# Patient Record
Sex: Female | Born: 1990 | Race: Black or African American | Hispanic: No | Marital: Married | Smoking: Former smoker
Health system: Southern US, Community
[De-identification: ages and names within clinical notes are randomized; demographics above are authoritative.]

## PROBLEM LIST (undated history)

## (undated) ENCOUNTER — Inpatient Hospital Stay (HOSPITAL_COMMUNITY): Payer: Self-pay

## (undated) DIAGNOSIS — D649 Anemia, unspecified: Secondary | ICD-10-CM

## (undated) DIAGNOSIS — K011 Impacted teeth: Secondary | ICD-10-CM

## (undated) DIAGNOSIS — B019 Varicella without complication: Secondary | ICD-10-CM

## (undated) HISTORY — DX: Varicella without complication: B01.9

## (undated) HISTORY — PX: NO PAST SURGERIES: SHX2092

---
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
## 2009-03-11 ENCOUNTER — Telehealth (INDEPENDENT_AMBULATORY_CARE_PROVIDER_SITE_OTHER): Payer: Self-pay | Admitting: *Deleted

## 2009-03-11 ENCOUNTER — Ambulatory Visit: Payer: Self-pay | Admitting: Internal Medicine

## 2009-09-24 ENCOUNTER — Emergency Department (HOSPITAL_COMMUNITY): Admission: EM | Admit: 2009-09-24 | Discharge: 2009-09-24 | Payer: Self-pay | Admitting: Family Medicine

## 2009-10-05 ENCOUNTER — Ambulatory Visit: Payer: Self-pay | Admitting: Nurse Practitioner

## 2009-10-05 ENCOUNTER — Inpatient Hospital Stay (HOSPITAL_COMMUNITY): Admission: AD | Admit: 2009-10-05 | Discharge: 2009-10-05 | Payer: Self-pay | Admitting: Obstetrics & Gynecology

## 2009-11-08 ENCOUNTER — Ambulatory Visit: Payer: Self-pay | Admitting: Family Medicine

## 2009-11-08 LAB — CONVERTED CEMR LAB
ALT: 10 units/L (ref 0–35)
AST: 14 units/L (ref 0–37)
Albumin: 4.5 g/dL (ref 3.5–5.2)
Alkaline Phosphatase: 59 units/L (ref 39–117)
BUN: 8 mg/dL (ref 6–23)
CO2: 24 meq/L (ref 19–32)
Calcium: 9.9 mg/dL (ref 8.4–10.5)
Chloride: 103 meq/L (ref 96–112)
Creatinine, Ser: 0.65 mg/dL (ref 0.40–1.20)
Glucose, Bld: 77 mg/dL (ref 70–99)
Helicobacter Pylori Antibody-IgG: 2.6 — ABNORMAL HIGH
Potassium: 4.3 meq/L (ref 3.5–5.3)
Sodium: 139 meq/L (ref 135–145)
Total Bilirubin: 0.4 mg/dL (ref 0.3–1.2)
Total Protein: 7.9 g/dL (ref 6.0–8.3)
Vit D, 25-Hydroxy: 12 ng/mL — ABNORMAL LOW (ref 30–89)
hCG, Beta Chain, Quant, S: <2 milliintl units/mL

## 2009-11-15 ENCOUNTER — Ambulatory Visit: Payer: Self-pay | Admitting: Gynecology

## 2009-11-15 ENCOUNTER — Inpatient Hospital Stay (HOSPITAL_COMMUNITY): Admission: AD | Admit: 2009-11-15 | Discharge: 2009-11-15 | Payer: Self-pay | Admitting: Obstetrics & Gynecology

## 2009-12-15 ENCOUNTER — Emergency Department (HOSPITAL_COMMUNITY): Admission: EM | Admit: 2009-12-15 | Discharge: 2009-12-15 | Payer: Self-pay | Admitting: Emergency Medicine

## 2010-04-09 ENCOUNTER — Emergency Department (HOSPITAL_COMMUNITY)
Admission: EM | Admit: 2010-04-09 | Discharge: 2010-04-09 | Payer: Self-pay | Source: Home / Self Care | Admitting: Emergency Medicine

## 2010-04-09 ENCOUNTER — Inpatient Hospital Stay (HOSPITAL_COMMUNITY)
Admission: AD | Admit: 2010-04-09 | Discharge: 2010-04-09 | Payer: Self-pay | Source: Home / Self Care | Attending: Obstetrics & Gynecology | Admitting: Obstetrics & Gynecology

## 2010-04-13 ENCOUNTER — Emergency Department (HOSPITAL_COMMUNITY)
Admission: EM | Admit: 2010-04-13 | Discharge: 2010-04-13 | Payer: Self-pay | Source: Home / Self Care | Admitting: Emergency Medicine

## 2010-04-27 ENCOUNTER — Emergency Department (HOSPITAL_COMMUNITY)
Admission: EM | Admit: 2010-04-27 | Discharge: 2010-04-27 | Payer: Self-pay | Source: Home / Self Care | Admitting: Emergency Medicine

## 2010-04-28 LAB — GLUCOSE, CAPILLARY
Glucose-Capillary: 435 mg/dL — ABNORMAL HIGH (ref 70–99)
Glucose-Capillary: 295 mg/dL — ABNORMAL HIGH (ref 70–99)
Glucose-Capillary: 204 mg/dL — ABNORMAL HIGH (ref 70–99)
Glucose-Capillary: 235 mg/dL — ABNORMAL HIGH (ref 70–99)

## 2010-04-28 LAB — BASIC METABOLIC PANEL
BUN: 5 mg/dL — ABNORMAL LOW (ref 6–23)
CO2: 25 mEq/L (ref 19–32)
Calcium: 9.1 mg/dL (ref 8.4–10.5)
Chloride: 96 mEq/L (ref 96–112)
Creatinine, Ser: 0.73 mg/dL (ref 0.4–1.2)
GFR calc Af Amer: >60 mL/min (ref >60)
GFR calc non Af Amer: >60 mL/min (ref >60)
Glucose, Bld: 409 mg/dL — ABNORMAL HIGH (ref 70–99)
Potassium: 3.9 mEq/L (ref 3.5–5.1)
Sodium: 130 mEq/L — ABNORMAL LOW (ref 135–145)

## 2010-04-28 LAB — URINALYSIS, ROUTINE W REFLEX MICROSCOPIC
Bilirubin Urine: NEGATIVE
Ketones, ur: 15 mg/dL — AB
Nitrite: NEGATIVE
Protein, ur: NEGATIVE mg/dL
Specific Gravity, Urine: 1.026 (ref 1.005–1.030)
Urine Glucose, Fasting: >1000 mg/dL — AB
Urobilinogen, UA: 0.2 mg/dL (ref 0.0–1.0)
pH: 6.5 (ref 5.0–8.0)

## 2010-04-28 LAB — URINE MICROSCOPIC-ADD ON

## 2010-05-26 ENCOUNTER — Emergency Department (HOSPITAL_COMMUNITY)
Admission: EM | Admit: 2010-05-26 | Discharge: 2010-05-27 | Disposition: A | Discharge status: Home / Self Care | Payer: Self-pay | Attending: Emergency Medicine | Admitting: Emergency Medicine

## 2010-05-26 DIAGNOSIS — E860 Dehydration: Secondary | ICD-10-CM | POA: Insufficient documentation

## 2010-05-26 DIAGNOSIS — N898 Other specified noninflammatory disorders of vagina: Secondary | ICD-10-CM | POA: Insufficient documentation

## 2010-05-26 DIAGNOSIS — E1169 Type 2 diabetes mellitus with other specified complication: Secondary | ICD-10-CM | POA: Insufficient documentation

## 2010-05-26 DIAGNOSIS — R112 Nausea with vomiting, unspecified: Secondary | ICD-10-CM | POA: Insufficient documentation

## 2010-05-26 LAB — URINALYSIS, ROUTINE W REFLEX MICROSCOPIC
Bilirubin Urine: NEGATIVE
Ketones, ur: NEGATIVE mg/dL
Leukocytes, UA: NEGATIVE
Nitrite: NEGATIVE
Protein, ur: NEGATIVE mg/dL
Specific Gravity, Urine: 1.034 — ABNORMAL HIGH (ref 1.005–1.030)
Urine Glucose, Fasting: >1000 mg/dL — AB
Urobilinogen, UA: 0.2 mg/dL (ref 0.0–1.0)
pH: 5.5 (ref 5.0–8.0)

## 2010-05-26 LAB — URINE MICROSCOPIC-ADD ON

## 2010-05-27 ENCOUNTER — Inpatient Hospital Stay (HOSPITAL_COMMUNITY): Payer: Self-pay

## 2010-05-27 ENCOUNTER — Inpatient Hospital Stay (HOSPITAL_COMMUNITY)
Admission: AD | Admit: 2010-05-27 | Discharge: 2010-05-28 | Disposition: A | Discharge status: Still In Hospital | Payer: Self-pay | Source: Ambulatory Visit | Attending: Obstetrics & Gynecology | Admitting: Obstetrics & Gynecology

## 2010-05-27 DIAGNOSIS — R7309 Other abnormal glucose: Secondary | ICD-10-CM

## 2010-05-27 DIAGNOSIS — R109 Unspecified abdominal pain: Secondary | ICD-10-CM | POA: Insufficient documentation

## 2010-05-27 DIAGNOSIS — N938 Other specified abnormal uterine and vaginal bleeding: Secondary | ICD-10-CM | POA: Insufficient documentation

## 2010-05-27 DIAGNOSIS — N949 Unspecified condition associated with female genital organs and menstrual cycle: Secondary | ICD-10-CM

## 2010-05-27 DIAGNOSIS — N921 Excessive and frequent menstruation with irregular cycle: Secondary | ICD-10-CM

## 2010-05-27 LAB — WET PREP, GENITAL
Trich, Wet Prep: NONE SEEN
Yeast Wet Prep HPF POC: NONE SEEN

## 2010-05-27 LAB — CBC
HCT: 33.9 % — ABNORMAL LOW (ref 36.0–46.0)
Hemoglobin: 11.1 g/dL — ABNORMAL LOW (ref 12.0–15.0)
MCH: 23.5 pg — ABNORMAL LOW (ref 26.0–34.0)
MCHC: 32.7 g/dL (ref 30.0–36.0)
MCV: 71.8 fL — ABNORMAL LOW (ref 78.0–100.0)
Platelets: 417 10*3/uL — ABNORMAL HIGH (ref 150–400)
RBC: 4.72 MIL/uL (ref 3.87–5.11)
RDW: 15.5 % (ref 11.5–15.5)
WBC: 7.9 10*3/uL (ref 4.0–10.5)

## 2010-05-27 LAB — BASIC METABOLIC PANEL
BUN: 8 mg/dL (ref 6–23)
CO2: 24 mEq/L (ref 19–32)
Calcium: 9.7 mg/dL (ref 8.4–10.5)
Chloride: 93 mEq/L — ABNORMAL LOW (ref 96–112)
Creatinine, Ser: 0.88 mg/dL (ref 0.4–1.2)
GFR calc Af Amer: >60 mL/min (ref >60)
GFR calc non Af Amer: >60 mL/min (ref >60)
Glucose, Bld: 628 mg/dL (ref 70–99)
Potassium: 4.1 mEq/L (ref 3.5–5.1)
Sodium: 130 mEq/L — ABNORMAL LOW (ref 135–145)

## 2010-05-27 LAB — PREGNANCY, URINE: Preg Test, Ur: NEGATIVE

## 2010-05-27 LAB — GLUCOSE, CAPILLARY
Glucose-Capillary: >600 mg/dL (ref 70–99)
Glucose-Capillary: 339 mg/dL — ABNORMAL HIGH (ref 70–99)

## 2010-05-27 LAB — GLUCOSE, RANDOM: Glucose, Bld: 483 mg/dL — ABNORMAL HIGH (ref 70–99)

## 2010-05-27 LAB — POCT PREGNANCY, URINE: Preg Test, Ur: NEGATIVE

## 2010-05-28 ENCOUNTER — Emergency Department (HOSPITAL_COMMUNITY)
Admission: EM | Admit: 2010-05-28 | Discharge: 2010-05-28 | Disposition: A | Discharge status: Home / Self Care | Payer: Self-pay | Attending: Emergency Medicine | Admitting: Emergency Medicine

## 2010-05-28 DIAGNOSIS — R42 Dizziness and giddiness: Secondary | ICD-10-CM | POA: Insufficient documentation

## 2010-05-28 DIAGNOSIS — E860 Dehydration: Secondary | ICD-10-CM | POA: Insufficient documentation

## 2010-05-28 DIAGNOSIS — R112 Nausea with vomiting, unspecified: Secondary | ICD-10-CM | POA: Insufficient documentation

## 2010-05-28 DIAGNOSIS — Z79899 Other long term (current) drug therapy: Secondary | ICD-10-CM | POA: Insufficient documentation

## 2010-05-28 DIAGNOSIS — E119 Type 2 diabetes mellitus without complications: Secondary | ICD-10-CM | POA: Insufficient documentation

## 2010-05-28 DIAGNOSIS — N898 Other specified noninflammatory disorders of vagina: Secondary | ICD-10-CM | POA: Insufficient documentation

## 2010-05-28 LAB — URINE MICROSCOPIC-ADD ON

## 2010-05-28 LAB — COMPREHENSIVE METABOLIC PANEL
ALT: 41 U/L — ABNORMAL HIGH (ref 0–35)
AST: 32 U/L (ref 0–37)
Albumin: 3.9 g/dL (ref 3.5–5.2)
Alkaline Phosphatase: 87 U/L (ref 39–117)
BUN: 2 mg/dL — ABNORMAL LOW (ref 6–23)
CO2: 25 mEq/L (ref 19–32)
Calcium: 9.1 mg/dL (ref 8.4–10.5)
Chloride: 100 mEq/L (ref 96–112)
Creatinine, Ser: 0.77 mg/dL (ref 0.4–1.2)
GFR calc Af Amer: >60 mL/min (ref >60)
GFR calc non Af Amer: >60 mL/min (ref >60)
Glucose, Bld: 470 mg/dL — ABNORMAL HIGH (ref 70–99)
Potassium: 4.5 mEq/L (ref 3.5–5.1)
Sodium: 133 mEq/L — ABNORMAL LOW (ref 135–145)
Total Bilirubin: 0.8 mg/dL (ref 0.3–1.2)
Total Protein: 7.7 g/dL (ref 6.0–8.3)

## 2010-05-28 LAB — URINALYSIS, ROUTINE W REFLEX MICROSCOPIC
Bilirubin Urine: NEGATIVE
Leukocytes, UA: NEGATIVE
Nitrite: NEGATIVE
Nitrite: NEGATIVE
Specific Gravity, Urine: 1.01 (ref 1.005–1.030)
Urobilinogen, UA: 0.2 mg/dL (ref 0.0–1.0)
pH: 6 (ref 5.0–8.0)

## 2010-05-28 LAB — BLOOD GAS, VENOUS
Acid-base deficit: 1.2 mmol/L (ref 0.0–2.0)
Bicarbonate: 23.6 mEq/L (ref 20.0–24.0)
O2 Saturation: 55.7 %
Patient temperature: 98.6
TCO2: 22.1 mmol/L (ref 0–100)

## 2010-05-28 LAB — CBC
MCH: 23.2 pg — ABNORMAL LOW (ref 26.0–34.0)
MCV: 70.4 fL — ABNORMAL LOW (ref 78.0–100.0)
Platelets: 375 10*3/uL (ref 150–400)
RBC: 4.56 MIL/uL (ref 3.87–5.11)

## 2010-05-28 LAB — GLUCOSE, CAPILLARY
Glucose-Capillary: 346 mg/dL — ABNORMAL HIGH (ref 70–99)
Glucose-Capillary: 362 mg/dL — ABNORMAL HIGH (ref 70–99)
Glucose-Capillary: 234 mg/dL — ABNORMAL HIGH (ref 70–99)
Glucose-Capillary: 541 mg/dL — ABNORMAL HIGH (ref 70–99)

## 2010-05-28 LAB — HEPATIC FUNCTION PANEL
AST: 24 U/L (ref 0–37)
Albumin: 3.4 g/dL — ABNORMAL LOW (ref 3.5–5.2)
Total Bilirubin: 0.5 mg/dL (ref 0.3–1.2)

## 2010-05-28 LAB — BASIC METABOLIC PANEL
CO2: 24 mEq/L (ref 19–32)
Chloride: 104 mEq/L (ref 96–112)
Glucose, Bld: 412 mg/dL — ABNORMAL HIGH (ref 70–99)
Potassium: 3.9 mEq/L (ref 3.5–5.1)
Sodium: 135 mEq/L (ref 135–145)

## 2010-05-28 LAB — DIFFERENTIAL
Eosinophils Absolute: 0.2 10*3/uL (ref 0.0–0.7)
Eosinophils Relative: 3 % (ref 0–5)
Lymphs Abs: 3.1 10*3/uL (ref 0.7–4.0)
Monocytes Relative: 6 % (ref 3–12)
Neutrophils Relative %: 50 % (ref 43–77)

## 2010-06-23 LAB — CBC
HCT: 37.8 % (ref 36.0–46.0)
Hemoglobin: 12.9 g/dL (ref 12.0–15.0)
MCH: 24.3 pg — ABNORMAL LOW (ref 26.0–34.0)
MCHC: 34.1 g/dL (ref 30.0–36.0)
MCV: 71.2 fL — ABNORMAL LOW (ref 78.0–100.0)
Platelets: 472 K/uL — ABNORMAL HIGH (ref 150–400)
RBC: 5.31 MIL/uL — ABNORMAL HIGH (ref 3.87–5.11)
RDW: 14.7 % (ref 11.5–15.5)
WBC: 8.9 10*3/uL (ref 4.0–10.5)

## 2010-06-23 LAB — URINE MICROSCOPIC-ADD ON

## 2010-06-23 LAB — URINALYSIS, ROUTINE W REFLEX MICROSCOPIC
Bilirubin Urine: NEGATIVE
Glucose, UA: >1000 mg/dL — AB
Ketones, ur: 15 mg/dL — AB
Ketones, ur: 15 mg/dL — AB
Nitrite: NEGATIVE
Nitrite: NEGATIVE
Protein, ur: NEGATIVE mg/dL
Protein, ur: NEGATIVE mg/dL
Specific Gravity, Urine: 1.034 — ABNORMAL HIGH (ref 1.005–1.030)
Urobilinogen, UA: 0.2 mg/dL (ref 0.0–1.0)
pH: 6 (ref 5.0–8.0)

## 2010-06-23 LAB — GLUCOSE, CAPILLARY
Glucose-Capillary: 331 mg/dL — ABNORMAL HIGH (ref 70–99)
Glucose-Capillary: 339 mg/dL — ABNORMAL HIGH (ref 70–99)
Glucose-Capillary: 418 mg/dL — ABNORMAL HIGH (ref 70–99)
Glucose-Capillary: 530 mg/dL — ABNORMAL HIGH (ref 70–99)

## 2010-06-23 LAB — DIFFERENTIAL
Basophils Absolute: 0.1 K/uL (ref 0.0–0.1)
Basophils Relative: 1 % (ref 0–1)
Eosinophils Absolute: 0.1 K/uL (ref 0.0–0.7)
Eosinophils Relative: 1 % (ref 0–5)
Lymphocytes Relative: 25 % (ref 12–46)
Lymphs Abs: 2.2 10*3/uL (ref 0.7–4.0)
Monocytes Absolute: 0.4 K/uL (ref 0.1–1.0)
Monocytes Relative: 5 % (ref 3–12)
Neutro Abs: 6.1 K/uL (ref 1.7–7.7)
Neutrophils Relative %: 68 % (ref 43–77)

## 2010-06-23 LAB — BASIC METABOLIC PANEL
CO2: 25 mEq/L (ref 19–32)
Calcium: 9.9 mg/dL (ref 8.4–10.5)
Chloride: 100 mEq/L (ref 96–112)
Chloride: 94 mEq/L — ABNORMAL LOW (ref 96–112)
GFR calc Af Amer: >60 mL/min (ref >60)
GFR calc Af Amer: >60 mL/min (ref >60)
GFR calc non Af Amer: >60 mL/min (ref >60)
Potassium: 4 mEq/L (ref 3.5–5.1)
Sodium: 131 mEq/L — ABNORMAL LOW (ref 135–145)
Sodium: 135 mEq/L (ref 135–145)

## 2010-06-23 LAB — BASIC METABOLIC PANEL WITH GFR
BUN: 6 mg/dL (ref 6–23)
Creatinine, Ser: 0.97 mg/dL (ref 0.4–1.2)
GFR calc non Af Amer: >60 mL/min (ref >60)
Glucose, Bld: 525 mg/dL — ABNORMAL HIGH (ref 70–99)
Potassium: 3.9 meq/L (ref 3.5–5.1)

## 2010-06-23 LAB — URINE CULTURE
Colony Count: >=100000
Culture  Setup Time: 201112290116

## 2010-06-23 LAB — POCT PREGNANCY, URINE: Preg Test, Ur: NEGATIVE

## 2010-06-26 LAB — URINALYSIS, ROUTINE W REFLEX MICROSCOPIC
Bilirubin Urine: NEGATIVE
Glucose, UA: NEGATIVE mg/dL
Hgb urine dipstick: NEGATIVE
Ketones, ur: NEGATIVE mg/dL
Protein, ur: NEGATIVE mg/dL

## 2010-06-26 LAB — URINE MICROSCOPIC-ADD ON

## 2010-06-26 LAB — PREGNANCY, URINE: Preg Test, Ur: NEGATIVE

## 2010-06-26 LAB — GLUCOSE, CAPILLARY: Glucose-Capillary: 111 mg/dL — ABNORMAL HIGH (ref 70–99)

## 2010-06-27 LAB — HCG, QUANTITATIVE, PREGNANCY: hCG, Beta Chain, Quant, S: <2 m[IU]/mL (ref <5)

## 2010-06-29 LAB — URINALYSIS, ROUTINE W REFLEX MICROSCOPIC
Glucose, UA: NEGATIVE mg/dL
Ketones, ur: NEGATIVE mg/dL
Protein, ur: NEGATIVE mg/dL

## 2010-06-30 LAB — POCT URINALYSIS DIP (DEVICE)
Bilirubin Urine: NEGATIVE
Glucose, UA: NEGATIVE mg/dL
Specific Gravity, Urine: 1.025 (ref 1.005–1.030)
Urobilinogen, UA: 0.2 mg/dL (ref 0.0–1.0)

## 2010-06-30 LAB — URINE CULTURE: Culture: NO GROWTH

## 2010-07-16 ENCOUNTER — Observation Stay (HOSPITAL_COMMUNITY)
Admission: EM | Admit: 2010-07-16 | Discharge: 2010-07-17 | Disposition: A | Discharge status: Home / Self Care | Payer: Self-pay | Source: Ambulatory Visit | Attending: Emergency Medicine | Admitting: Emergency Medicine

## 2010-07-16 DIAGNOSIS — E119 Type 2 diabetes mellitus without complications: Principal | ICD-10-CM | POA: Insufficient documentation

## 2010-07-16 LAB — COMPREHENSIVE METABOLIC PANEL
Alkaline Phosphatase: 98 U/L (ref 39–117)
BUN: 4 mg/dL — ABNORMAL LOW (ref 6–23)
CO2: 24 mEq/L (ref 19–32)
GFR calc non Af Amer: >60 mL/min (ref >60)
Glucose, Bld: 513 mg/dL — ABNORMAL HIGH (ref 70–99)
Potassium: 4.1 mEq/L (ref 3.5–5.1)
Total Protein: 7.9 g/dL (ref 6.0–8.3)

## 2010-07-16 LAB — GLUCOSE, CAPILLARY
Glucose-Capillary: >600 mg/dL (ref 70–99)
Glucose-Capillary: 399 mg/dL — ABNORMAL HIGH (ref 70–99)
Glucose-Capillary: 278 mg/dL — ABNORMAL HIGH (ref 70–99)
Glucose-Capillary: 308 mg/dL — ABNORMAL HIGH (ref 70–99)

## 2010-07-16 LAB — DIFFERENTIAL
Basophils Relative: 1 % (ref 0–1)
Eosinophils Absolute: 0.2 10*3/uL (ref 0.0–0.7)
Neutrophils Relative %: 60 % (ref 43–77)

## 2010-07-16 LAB — URINALYSIS, ROUTINE W REFLEX MICROSCOPIC
Glucose, UA: >1000 mg/dL — AB
Ketones, ur: NEGATIVE mg/dL
Nitrite: NEGATIVE
Protein, ur: NEGATIVE mg/dL
Urobilinogen, UA: 0.2 mg/dL (ref 0.0–1.0)

## 2010-07-16 LAB — CBC
HCT: 37.5 % (ref 36.0–46.0)
Hemoglobin: 12.4 g/dL (ref 12.0–15.0)
MCHC: 33.1 g/dL (ref 30.0–36.0)
MCV: 70.4 fL — ABNORMAL LOW (ref 78.0–100.0)
RDW: 15.6 % — ABNORMAL HIGH (ref 11.5–15.5)
WBC: 8.6 10*3/uL (ref 4.0–10.5)

## 2010-07-16 LAB — URINE MICROSCOPIC-ADD ON

## 2010-07-16 LAB — POCT PREGNANCY, URINE: Preg Test, Ur: NEGATIVE

## 2010-07-17 LAB — GLUCOSE, CAPILLARY: Glucose-Capillary: 182 mg/dL — ABNORMAL HIGH (ref 70–99)

## 2011-08-19 ENCOUNTER — Encounter (HOSPITAL_COMMUNITY): Payer: Self-pay

## 2011-08-19 ENCOUNTER — Inpatient Hospital Stay (HOSPITAL_COMMUNITY)
Admission: AD | Admit: 2011-08-19 | Discharge: 2011-08-19 | Disposition: A | Discharge status: Home / Self Care | Payer: Self-pay | Source: Ambulatory Visit | Attending: Obstetrics & Gynecology | Admitting: Obstetrics & Gynecology

## 2011-08-19 DIAGNOSIS — L293 Anogenital pruritus, unspecified: Secondary | ICD-10-CM | POA: Insufficient documentation

## 2011-08-19 DIAGNOSIS — A499 Bacterial infection, unspecified: Secondary | ICD-10-CM | POA: Insufficient documentation

## 2011-08-19 DIAGNOSIS — B9689 Other specified bacterial agents as the cause of diseases classified elsewhere: Secondary | ICD-10-CM | POA: Insufficient documentation

## 2011-08-19 DIAGNOSIS — N760 Acute vaginitis: Secondary | ICD-10-CM | POA: Insufficient documentation

## 2011-08-19 DIAGNOSIS — B3731 Acute candidiasis of vulva and vagina: Secondary | ICD-10-CM | POA: Insufficient documentation

## 2011-08-19 DIAGNOSIS — B373 Candidiasis of vulva and vagina: Secondary | ICD-10-CM | POA: Insufficient documentation

## 2011-08-19 LAB — URINE MICROSCOPIC-ADD ON

## 2011-08-19 LAB — POCT PREGNANCY, URINE: Preg Test, Ur: NEGATIVE

## 2011-08-19 LAB — URINALYSIS, ROUTINE W REFLEX MICROSCOPIC
Bilirubin Urine: NEGATIVE
Glucose, UA: >1000 mg/dL — AB
Hgb urine dipstick: NEGATIVE
Ketones, ur: NEGATIVE mg/dL
Protein, ur: NEGATIVE mg/dL
Urobilinogen, UA: 0.2 mg/dL (ref 0.0–1.0)

## 2011-08-19 LAB — WET PREP, GENITAL: Yeast Wet Prep HPF POC: NONE SEEN

## 2011-08-19 MED ORDER — METRONIDAZOLE 500 MG PO TABS: 500.0000 mg | ORAL_TABLET | Freq: Three times a day (TID) | ORAL | Status: AC

## 2011-08-19 MED ORDER — FLUCONAZOLE 150 MG PO TABS: 150.0000 mg | ORAL_TABLET | Freq: Once | ORAL | Status: AC

## 2011-08-19 NOTE — Discharge Instructions (Signed)
Candida Infection, Adult A candida infection (also called yeast, fungus and Monilia infection) is an overgrowth of yeast that can occur anywhere on the body. A yeast infection commonly occurs in warm, moist body areas. Usually, the infection remains localized but can spread to become a systemic infection. A yeast infection may be a sign of a more severe disease such as diabetes, leukemia, or AIDS. A yeast infection can occur in both men and women. In women, Candida vaginitis is a vaginal infection. It is one of the most common causes of vaginitis. Men usually do not have symptoms or know they have an infection until other problems develop. Men may find out they have a yeast infection because their sex partner has a yeast infection. Uncircumcised men are more likely to get a yeast infection than circumcised men. This is because the uncircumcised glans is not exposed to air and does not remain as dry as that of a circumcised glans. Older adults may develop yeast infections around dentures. CAUSES  Women  Antibiotics.   Steroid medication taken for a long time.   Being overweight (obese).   Diabetes.   Poor immune condition.   Certain serious medical conditions.   Immune suppressive medications for organ transplant patients.   Chemotherapy.   Pregnancy.   Menstration.   Stress and fatigue.   Intravenous drug use.   Oral contraceptives.   Wearing tight-fitting clothes in the crotch area.   Catching it from a sex partner who has a yeast infection.   Spermicide.   Intravenous, urinary, or other catheters.  Men  Catching it from a sex partner who has a yeast infection.   Having oral or anal sex with a person who has the infection.   Spermicide.   Diabetes.   Antibiotics.   Poor immune system.   Medications that suppress the immune system.   Intravenous drug use.   Intravenous, urinary, or other catheters.  SYMPTOMS  Women  Thick, white vaginal discharge.    Vaginal itching.   Redness and swelling in and around the vagina.   Irritation of the lips of the vagina and perineum.   Blisters on the vaginal lips and perineum.   Painful sexual intercourse.   Low blood sugar (hypoglycemia).   Painful urination.   Bladder infections.   Intestinal problems such as constipation, indigestion, bad breath, bloating, increase in gas, diarrhea, or loose stools.  Men  Men may develop intestinal problems such as constipation, indigestion, bad breath, bloating, increase in gas, diarrhea, or loose stools.   Dry, cracked skin on the penis with itching or discomfort.   Jock itch.   Dry, flaky skin.   Athlete's foot.   Hypoglycemia.  DIAGNOSIS  Women  A history and an exam are performed.   The discharge may be examined under a microscope.   A culture may be taken of the discharge.  Men  A history and an exam are performed.   Any discharge from the penis or areas of cracked skin will be looked at under the microscope and cultured.   Stool samples may be cultured.  TREATMENT  Women  Vaginal antifungal suppositories and creams.   Medicated creams to decrease irritation and itching on the outside of the vagina.   Warm compresses to the perineal area to decrease swelling and discomfort.   Oral antifungal medications.   Medicated vaginal suppositories or cream for repeated or recurrent infections.   Wash and dry the irritation areas before applying the cream.     Eating yogurt with lactobacillus may help with prevention and treatment.   Sometimes painting the vagina with gentian violet solution may help if creams and suppositories do not work.  Men  Antifungal creams and oral antifungal medications.   Sometimes treatment must continue for 30 days after the symptoms go away to prevent recurrence.  HOME CARE INSTRUCTIONS  Women  Use cotton underwear and avoid tight-fitting clothing.   Avoid colored, scented toilet paper and  deodorant tampons or pads.   Do not douche.   Keep your diabetes under control.   Finish all the prescribed medications.   Keep your skin clean and dry.   Consume milk or yogurt with lactobacillus active culture regularly. If you get frequent yeast infections and think that is what the infection is, there are over-the-counter medications that you can get. If the infection does not show healing in 3 days, talk to your caregiver.   Tell your sex partner you have a yeast infection. Your partner may need treatment also, especially if your infection does not clear up or recurs.  Men  Keep your skin clean and dry.   Keep your diabetes under control.   Finish all prescribed medications.   Tell your sex partner that you have a yeast infection so they can be treated if necessary.  SEEK MEDICAL CARE IF:   Your symptoms do not clear up or worsen in one week after treatment.   You have an oral temperature above 102 F (38.9 C).   You have trouble swallowing or eating for a prolonged time.   You develop blisters on and around your vagina.   You develop vaginal bleeding and it is not your menstrual period.   You develop abdominal pain.   You develop intestinal problems as mentioned above.   You get weak or lightheaded.   You have painful or increased urination.   You have pain during sexual intercourse.  MAKE SURE YOU:  UndeType 1 Diabetes Diabetes is a long-lasting (chronic) disease. It happens when the cells in the pancreas are destroyed and cannot make the hormone insulin. This disease may be passed to you from your parents (genetic). You will need to check your blood sugar (glucose) every day. A treatment plan will be set up for you. HOME CARE Never run out of insulin. You need it every day.  Do not skip insulin doses.  Do not skip meals. Eat healthy.  Follow your treatment and monitoring plan.  Wear a necklace or bracelet that says you have diabetes and take insulin.  If  you start a new exercise or sport, watch for low blood sugar (hypoglycemia) problems. Your insulin dosing may need to be changed.  Follow up with your doctor as told.  Tell your work or school about your diabetes treatment plan.  GET HELP RIGHT AWAY IF:  You have trouble keeping your blood sugar in target range.  You have blood sugar readings that are often too high or too low.  You have problems with your medicines.  You are sick and are not getting better after 24 hours.  You have a sore or wound that is not healing.  You have vision problems or changes.  You have a fever.  MAKE SURE YOU:  Understand these instructions.  Will watch your condition.  Will get help right away if you are not doing well or get worse.  Document Released: 01/07/2008 Document Revised: 03/19/2011 Document Reviewed: 09/15/2010  ExitCare Patient Information 2012 [employer].rstand these instructions.  Will watch your condition.   Will get help right away if you are not doing well or get worse.  Document Released: 05/07/2004 Document Revised: 03/19/2011 Document Reviewed: 08/19/2009 ExitCare Patient Information 2012 ExitCare, LLCBacterial Vaginosis Bacterial vaginosis (BV) is a vaginal infection where the normal balance of bacteria in the vagina is disrupted. The normal balance is then replaced by an overgrowth of certain bacteria. There are several different kinds of bacteria that can cause BV. BV is the most common vaginal infection in women of childbearing age. CAUSES   The cause of BV is not fully understood. BV develops when there is an increase or imbalance of harmful bacteria.   Some activities or behaviors can upset the normal balance of bacteria in the vagina and put women at increased risk including:   Having a new sex partner or multiple sex partners.   Douching.   Using an intrauterine device (IUD) for contraception.   It is not clear what role sexual activity plays in the development of  BV. However, women that have never had sexual intercourse are rarely infected with BV.  Women do not get BV from toilet seats, bedding, swimming pools or from touching objects around them.  SYMPTOMS   Grey vaginal discharge.   A fish-like odor with discharge, especially after sexual intercourse.   Itching or burning of the vagina and vulva.   Burning or pain with urination.   Some women have no signs or symptoms at all.  DIAGNOSIS  Your caregiver must examine the vagina for signs of BV. Your caregiver will perform lab tests and look at the sample of vaginal fluid through a microscope. They will look for bacteria and abnormal cells (clue cells), a pH test higher than 4.5, and a positive amine test all associated with BV.  RISKS AND COMPLICATIONS   Pelvic inflammatory disease (PID).   Infections following gynecology surgery.   Developing HIV.   Developing herpes virus.  TREATMENT  Sometimes BV will clear up without treatment. However, all women with symptoms of BV should be treated to avoid complications, especially if gynecology surgery is planned. Female partners generally do not need to be treated. However, BV may spread between female sex partners so treatment is helpful in preventing a recurrence of BV.   BV may be treated with antibiotics. The antibiotics come in either pill or vaginal cream forms. Either can be used with nonpregnant or pregnant women, but the recommended dosages differ. These antibiotics are not harmful to the baby.   BV can recur after treatment. If this happens, a second round of antibiotics will often be prescribed.   Treatment is important for pregnant women. If not treated, BV can cause a premature delivery, especially for a pregnant woman who had a premature birth in the past. All pregnant women who have symptoms of BV should be checked and treated.   For chronic reoccurrence of BV, treatment with a type of prescribed gel vaginally twice a week is helpful.    HOME CARE INSTRUCTIONS   Finish all medication as directed by your caregiver.   Do not have sex until treatment is completed.   Tell your sexual partner that you have a vaginal infection. They should see their caregiver and be treated if they have problems, such as a mild rash or itching.   Practice safe sex. Use condoms. Only have 1 sex partner.  PREVENTION  Basic prevention steps can help reduce the risk of upsetting the natural balance of bacteria in the  vagina and developing BV:  Do not have sexual intercourse (be abstinent).   Do not douche.   Use all of the medicine prescribed for treatment of BV, even if the signs and symptoms go away.   Tell your sex partner if you have BV. That way, they can be treated, if needed, to prevent reoccurrence.  SEEK MEDICAL CARE IF:   Your symptoms are not improving after 3 days of treatment.   You have increased discharge, pain, or fever.  MAKE SURE YOU:   Understand these instructions.   Will watch your condition.   Will get help right away if you are not doing well or get worse.  FOR MORE INFORMATION  Division of STD Prevention (DSTDP), Centers for Disease Control and Prevention: [employer] American Social Health Association (ASHA): www.ashastd.org  Document Released: 03/30/2005 Document Revised: 03/19/2011 Document Reviewed: 09/20/2008 [facility] Patient Information 2012 [place], [place].Marland Kitchen

## 2011-08-19 NOTE — MAU Provider Note (Signed)
Attestation of Attending Supervision of Advanced Practitioner: Evaluation and management procedures were performed by the [facility] Fellow/PA/CNM/NP under my supervision and collaboration. Chart reviewed, and agree with management and plan.  Jaynie Collins, M.D. 08/19/2011 9:13 PM

## 2011-08-19 NOTE — MAU Provider Note (Signed)
Andrea Nader Wharton20 y.o.G0P0000 @Unknown  by LMP Chief Complaint  Patient presents with  . Vaginal Itching      First Provider Initiated Contact with Patient 08/19/11 2039        SUBJECTIVE  HPI: 21 yo nulliparous Diabetic AAF with DM presents with history of irritative vaginal discharge intermittently over the past 6 months. No OTC tx. Tried. She now has yellow discharge and vulvovaginal pruritis. Does not check sugars regularly and does not have a PMD.  Past Medical History  Diagnosis Date  . Diabetes mellitus     type 2 on insulin   Past Surgical History  Procedure Date  . No past surgeries    History   Social History  . Marital Status: Single    Spouse Name: N/A    Number of Children: N/A  . Years of Education: N/A   Occupational History  . Not on file.   Social History Main Topics  . Smoking status: Never Smoker   . Smokeless tobacco: Not on file  . Alcohol Use:   . Drug Use: No  . Sexually Active: Yes    Birth Control/ Protection: None   Other Topics Concern  . Not on file   Social History Narrative  . No narrative on file   No current facility-administered medications on file prior to encounter.   Current Outpatient Prescriptions on File Prior to Encounter  Medication Sig Dispense Refill  . insulin aspart (NOVOLOG) 100 UNIT/ML injection Inject 2-14 Units into the skin 3 (three) times daily before meals. Sliding scale depending on glucose      . insulin glargine (LANTUS) 100 UNIT/ML injection Inject 15 Units into the skin 2 (two) times daily.       No Known Allergies  ROS: Pertinent items in HPI  OBJECTIVE Blood pressure 123/59, pulse 95, temperature 97.2 F (36.2 C), temperature source Oral, resp. rate 18, height 5\' 2"  (1.575 m), weight 106.595 kg (235 lb), last menstrual period 07/20/2011, SpO2 99.00%.  GENERAL: Obese female in no acute distress.  HEENT: Normocephalic, good dentition HEART: normal rate RESP: normal effort ABDOMEN: Soft,  nontender EXTREMITIES: Nontender, no edema NEURO: Alert and oriented SPECULUM EXAM: pale red vulvar rash, thick white discharge, cervix clean BIMANUAL: cervix closed; uterus and adnexae unable to outline due to body habitus   LAB RESULTS  Results for orders placed during the hospital encounter of 08/19/11 (from the past 24 hour(s))  URINALYSIS, ROUTINE W REFLEX MICROSCOPIC     Status: Abnormal   Collection Time   08/19/11  7:35 PM      Component Value Range   Color, Urine YELLOW  YELLOW    APPearance CLEAR  CLEAR    Specific Gravity, Urine <1.005 (*) 1.005 - 1.030    pH 7.0  5.0 - 8.0    Glucose, UA >1000 (*) NEGATIVE (mg/dL)   Hgb urine dipstick NEGATIVE  NEGATIVE    Bilirubin Urine NEGATIVE  NEGATIVE    Ketones, ur NEGATIVE  NEGATIVE (mg/dL)   Protein, ur NEGATIVE  NEGATIVE (mg/dL)   Urobilinogen, UA 0.2  0.0 - 1.0 (mg/dL)   Nitrite NEGATIVE  NEGATIVE    Leukocytes, UA NEGATIVE  NEGATIVE   URINE MICROSCOPIC-ADD ON     Status: Abnormal   Collection Time   08/19/11  7:35 PM      Component Value Range   Squamous Epithelial / LPF FEW (*) RARE    WBC, UA 0-2  <3 (WBC/hpf)   RBC / HPF 0-2  <  3 (RBC/hpf)   Bacteria, UA FEW (*) RARE   POCT PREGNANCY, URINE     Status: Normal   Collection Time   08/19/11  7:52 PM      Component Value Range   Preg Test, Ur NEGATIVE  NEGATIVE   WET PREP, GENITAL     Status: Abnormal   Collection Time   08/19/11  8:49 PM      Component Value Range   Yeast Wet Prep HPF POC NONE SEEN  NONE SEEN    Trich, Wet Prep NONE SEEN  NONE SEEN    Clue Cells Wet Prep HPF POC MODERATE (*) NONE SEEN    WBC, Wet Prep HPF POC FEW (*) NONE SEEN        ASSESSMENT Candida vulvovaginitis by exam BV by [facility]  PLAN Rx Diflucan and Flagyl Discussed contraception which she declines. Stressed getting sugars controlled before attempting pregnancy, start PNC early.  Rx PNV. Referred to [facility].     Leora Platt 08/19/2011 8:39 PM

## 2011-08-19 NOTE — MAU Note (Signed)
Pt states yellow discharge, vaginal itching and vaginal swelling off and on x6-8 months. Discharge is at time malodorous. States hasn't tried any medication for symptoms.

## 2011-08-19 NOTE — MAU Note (Signed)
Pt presents with complaint "i think i may have a yeast infection", reports discharge and vaginal itching off/on for 5-6  Months. LMP 07/20/2011, no birth control.

## 2011-10-27 ENCOUNTER — Encounter (HOSPITAL_COMMUNITY): Payer: Self-pay

## 2011-10-27 ENCOUNTER — Emergency Department (HOSPITAL_COMMUNITY)
Admission: EM | Admit: 2011-10-27 | Discharge: 2011-10-27 | Disposition: A | Discharge status: Home / Self Care | Payer: Self-pay | Attending: Emergency Medicine | Admitting: Emergency Medicine

## 2011-10-27 DIAGNOSIS — E119 Type 2 diabetes mellitus without complications: Secondary | ICD-10-CM | POA: Insufficient documentation

## 2011-10-27 DIAGNOSIS — Z794 Long term (current) use of insulin: Secondary | ICD-10-CM | POA: Insufficient documentation

## 2011-10-27 DIAGNOSIS — N949 Unspecified condition associated with female genital organs and menstrual cycle: Secondary | ICD-10-CM | POA: Insufficient documentation

## 2011-10-27 DIAGNOSIS — R102 Pelvic and perineal pain: Secondary | ICD-10-CM

## 2011-10-27 LAB — URINALYSIS, ROUTINE W REFLEX MICROSCOPIC
Bilirubin Urine: NEGATIVE
Hgb urine dipstick: NEGATIVE
Ketones, ur: NEGATIVE mg/dL
Nitrite: NEGATIVE
Specific Gravity, Urine: 1.025 (ref 1.005–1.030)
Urobilinogen, UA: 0.2 mg/dL (ref 0.0–1.0)
pH: 6.5 (ref 5.0–8.0)

## 2011-10-27 LAB — WET PREP, GENITAL
Trich, Wet Prep: NONE SEEN
Yeast Wet Prep HPF POC: NONE SEEN

## 2011-10-27 NOTE — ED Notes (Signed)
Pt presents with no acute distress. Presents with c/o of lower abd pain x 1 day.  Nausea only.  Deneis V/D and fever.  At times pain with urination and pressure with BM.  Pt is a diabetic level not checked however medications taken.  She is a type I

## 2011-10-27 NOTE — ED Provider Notes (Signed)
History     CSN: [id number]  Arrival date & time 10/27/11  1743   First MD Initiated Contact with Patient 10/27/11 1829      Chief Complaint  Patient presents with  . Pelvic Pain  . Dysuria    (Consider location/radiation/quality/duration/timing/severity/associated sxs/prior treatment) Patient is a 21 y.o. female presenting with pelvic pain. The history is provided by the patient.  Pelvic Pain This is a new problem. The current episode started yesterday. Associated symptoms include abdominal pain. Pertinent negatives include no chills, fever, nausea, vomiting or weakness.  Pt states the pain started suddenly. Pain was suprapubic, severe, lasted about . States she sat on the toilet feeling urge to urinate and have a BM but wasn't able to go. States since then pain has been improving. States now pain is only mild but she was concerned not knowing what happened and came here for evaluation. Pt states some pain with urination, no hematuria, no frequent urinations, no problems with bowel movements.   Past Medical History  Diagnosis Date  . Diabetes mellitus     type 2 on insulin    Past Surgical History  Procedure Date  . No past surgeries     Family History  Problem Relation Age of Onset  . Anesthesia problems Neg Hx     History  Substance Use Topics  . Smoking status: Never Smoker   . Smokeless tobacco: Not on file  . Alcohol Use: No    OB History    Grav Para Term Preterm Abortions TAB SAB Ect Mult Living   0 0 0 0 0 0 0 0 0 0       Review of Systems  Constitutional: Negative for fever and chills.  Respiratory: Negative.   Cardiovascular: Negative.   Gastrointestinal: Positive for abdominal pain. Negative for nausea and vomiting.  Genitourinary: Positive for dysuria and pelvic pain. Negative for urgency, flank pain, vaginal bleeding and vaginal discharge.  Musculoskeletal: Negative.   Neurological: Negative for dizziness and weakness.    Allergies    Review of patient's allergies indicates no known allergies.  Home Medications   Current Outpatient Rx  Name Route Sig Dispense Refill  . INSULIN ASPART 100 UNIT/ML [place] SOLN Subcutaneous Inject 12 Units into the skin 3 (three) times daily before meals. Sliding scale depending on glucose    . INSULIN GLARGINE 100 UNIT/ML [place] SOLN Subcutaneous Inject 40 Units into the skin at bedtime.       BP 125/73  Pulse 99  Temp 98.5 F (36.9 C) (Oral)  Resp 18  SpO2 100%  LMP 10/01/2011  Physical Exam  Nursing note and vitals reviewed. Constitutional: She is oriented to person, place, and time. She appears well-developed and well-nourished. No distress.  Eyes: Conjunctivae are normal.  Neck: Neck supple.  Cardiovascular: Normal rate, regular rhythm and normal heart sounds.   Pulmonary/Chest: Effort normal and breath sounds normal. No respiratory distress. She has no wheezes. She has no rales.  Abdominal: Soft. Bowel sounds are normal. She exhibits no distension. There is no rebound.       Suprapubic tenderness  Genitourinary:       Normal external genitalia, normal vaginal canal, white discharge, no cervical motion tenderness, no adnexal tenderness, no uterine tenderness  Musculoskeletal: Normal range of motion. She exhibits no edema.  Neurological: She is alert and oriented to person, place, and time.  Skin: Skin is warm and dry.  Psychiatric: She has a normal mood and affect.    ED Course  Procedures (including critical care time)  Pt with pelvic pain, now almost gone. Abdomen mildly tender over supapubic area. Will get UA, pelvic exam. Pt's vs normal, she is non toxic  Results for orders placed during the hospital encounter of 10/27/11  URINALYSIS, ROUTINE W REFLEX MICROSCOPIC      Component Value Range   Color, Urine YELLOW  YELLOW   APPearance CLOUDY (*) CLEAR   Specific Gravity, Urine 1.025  1.005 - 1.030   pH 6.5  5.0 - 8.0   Glucose, UA >1000 (*) NEGATIVE mg/dL   Hgb urine  dipstick NEGATIVE  NEGATIVE   Bilirubin Urine NEGATIVE  NEGATIVE   Ketones, ur NEGATIVE  NEGATIVE mg/dL   Protein, ur NEGATIVE  NEGATIVE mg/dL   Urobilinogen, UA 0.2  0.0 - 1.0 mg/dL   Nitrite NEGATIVE  NEGATIVE   Leukocytes, UA NEGATIVE  NEGATIVE  POCT PREGNANCY, URINE      Component Value Range   Preg Test, Ur NEGATIVE  NEGATIVE  WET PREP, GENITAL      Component Value Range   Yeast Wet Prep HPF POC NONE SEEN  NONE SEEN   Trich, Wet Prep NONE SEEN  NONE SEEN   Clue Cells Wet Prep HPF POC RARE (*) NONE SEEN   WBC, Wet Prep HPF POC RARE (*) NONE SEEN  GLUCOSE, CAPILLARY      Component Value Range   Glucose-Capillary 250 (*) 70 - 99 mg/dL  URINE MICROSCOPIC-ADD ON      Component Value Range   Squamous Epithelial / LPF FEW (*) RARE   Bacteria, UA FEW (*) RARE   Urine-Other MUCOUS PRESENT     Negative UA and unremarkable wet preg. GC/chlamydia sent. Pt non tender on exam at present. Possible ovarian cyst that has ruptured last night. Pain almost all resolved. Will d/c home with close follow up with GYN. I doubt this is torsed ovary, doubt intestinal problem, no change in bm non tender, not pregnant. Instructed to return if worsening.    1. Pelvic pain in female       MDM          Lottie Mussel, [place] 10/27/11 2045

## 2011-10-27 NOTE — ED Notes (Signed)
250mg  RN acknowledge

## 2011-10-27 NOTE — ED Provider Notes (Signed)
Medical screening examination/treatment/procedure(s) were performed by non-physician practitioner and as supervising physician I was immediately available for consultation/collaboration.  Flint Melter, MD 10/27/11 [phone]

## 2011-10-28 LAB — GC/CHLAMYDIA PROBE AMP, GENITAL
Chlamydia, DNA Probe: NEGATIVE
GC Probe Amp, Genital: NEGATIVE

## 2011-11-04 ENCOUNTER — Encounter (HOSPITAL_COMMUNITY): Payer: Self-pay | Admitting: Emergency Medicine

## 2011-11-04 ENCOUNTER — Emergency Department (HOSPITAL_COMMUNITY)
Admission: EM | Admit: 2011-11-04 | Discharge: 2011-11-04 | Disposition: A | Discharge status: Home / Self Care | Payer: Self-pay | Attending: Emergency Medicine | Admitting: Emergency Medicine

## 2011-11-04 DIAGNOSIS — R51 Headache: Secondary | ICD-10-CM | POA: Insufficient documentation

## 2011-11-04 DIAGNOSIS — E119 Type 2 diabetes mellitus without complications: Secondary | ICD-10-CM | POA: Insufficient documentation

## 2011-11-04 DIAGNOSIS — Z794 Long term (current) use of insulin: Secondary | ICD-10-CM | POA: Insufficient documentation

## 2011-11-04 LAB — CBC WITH DIFFERENTIAL/PLATELET
Basophils Absolute: 0 10*3/uL (ref 0.0–0.1)
Eosinophils Absolute: 0.1 10*3/uL (ref 0.0–0.7)
Lymphocytes Relative: 37 % (ref 12–46)
Lymphs Abs: 2.1 10*3/uL (ref 0.7–4.0)
Neutrophils Relative %: 56 % (ref 43–77)
Platelets: 494 10*3/uL — ABNORMAL HIGH (ref 150–400)
RBC: 5.26 MIL/uL — ABNORMAL HIGH (ref 3.87–5.11)
WBC: 5.8 10*3/uL (ref 4.0–10.5)

## 2011-11-04 LAB — BASIC METABOLIC PANEL
CO2: 24 mEq/L (ref 19–32)
GFR calc non Af Amer: >90 mL/min (ref >90)
Glucose, Bld: 124 mg/dL — ABNORMAL HIGH (ref 70–99)
Potassium: 3.4 mEq/L — ABNORMAL LOW (ref 3.5–5.1)
Sodium: 138 mEq/L (ref 135–145)

## 2011-11-04 LAB — GLUCOSE, CAPILLARY: Glucose-Capillary: 135 mg/dL — ABNORMAL HIGH (ref 70–99)

## 2011-11-04 MED ORDER — IBUPROFEN 800 MG PO TABS
800.0000 mg | ORAL_TABLET | Freq: Once | ORAL | Status: AC
  Administered 2011-11-04: 800 mg via ORAL
  Filled 2011-11-04: qty 1

## 2011-11-04 MED ORDER — INSULIN ASPART 100 UNIT/ML ~~LOC~~ SOLN: 12.0000 [IU] | Freq: Three times a day (TID) | SUBCUTANEOUS | Status: DC

## 2011-11-04 NOTE — ED Notes (Signed)
Pt given sandwich, chips, sprite per EDPA request.

## 2011-11-04 NOTE — ED Provider Notes (Signed)
Medical screening examination/treatment/procedure(s) were performed by non-physician practitioner and as supervising physician I was immediately available for consultation/collaboration.  Carylon Tamburro K Linker, MD 11/04/11 1751 

## 2011-11-04 NOTE — ED Notes (Signed)
Pt reports feeling "100% better", no pain after medication and eating. Pt educated on importance of checking CBG as prescribed, and insulin administration. Pt verbalizes understanding.

## 2011-11-04 NOTE — ED Provider Notes (Signed)
History     CSN: [id number]  Arrival date & time 11/04/11  1411   First MD Initiated Contact with Patient 11/04/11 [phone]      Chief Complaint  Patient presents with  . Headache    (Consider location/radiation/quality/duration/timing/severity/associated sxs/prior treatment) Patient is a 21 y.o. female presenting with headaches. The history is provided by the patient.  Headache  This is a recurrent problem.   patient with a history of insulin-dependent diabetes, presented to emergency department as she was concerned her blood sugar may be high as she is out of her NovoLog, though she has not eaten anything all day. Reports that in the past, she typically gets headaches when she does not eat. Blood sugar checked in triage prior to my assessment is 135.  Described as frontal, sharp, 5/10. Onset was gradual. No head injury. No associated fever, neck stiffness, rash, visual change, difficulty ambulating, trouble speaking, weakness or numbness to the arms or legs. No nausea or photophobia. No aggravating or alleviating factors. Has not tried anything to treat the headache, though she typically takes Tylenol or ibuprofen with good relief.  Past Medical History  Diagnosis Date  . Diabetes mellitus     type 2 on insulin    Past Surgical History  Procedure Date  . No past surgeries     Family History  Problem Relation Age of Onset  . Anesthesia problems Neg Hx     History  Substance Use Topics  . Smoking status: Never Smoker   . Smokeless tobacco: Not on file  . Alcohol Use: No    Review of Systems  Neurological: Positive for headaches.  10 systems reviewed and are otherwise negative for acute change except as noted in the HPI.   Allergies  Review of patient's allergies indicates no known allergies.  Home Medications   Current Outpatient Rx  Name Route Sig Dispense Refill  . INSULIN ASPART 100 UNIT/ML [place] SOLN Subcutaneous Inject 12 Units into the skin 3 (three) times daily  before meals. Sliding scale depending on glucose    . INSULIN GLARGINE 100 UNIT/ML [place] SOLN Subcutaneous Inject 40 Units into the skin at bedtime.       BP 119/68  Pulse 92  Temp 98.5 F (36.9 C) (Oral)  Resp 18  SpO2 98%  LMP 10/31/2011  Physical Exam Physical Examination: General appearance - alert, well appearing, and in no distress and overweight Mental status - alert, oriented to person, place, and time Eyes - pupils equal and reactive, extraocular eye movements intact Ears - bilateral TM's and external ear canals normal Mouth - mucous membranes moist, pharynx normal without lesions Neck - supple, no significant adenopathy Heart - normal rate and regular rhythm Neurological - alert, oriented, normal speech, no focal findings or movement disorder noted, motor and sensory grossly normal bilaterally, Romberg sign negative, normal gait and station Extremities - no pedal edema noted, intact peripheral pulses  ED Course  Procedures (including critical care time)  Labs Reviewed  GLUCOSE, CAPILLARY - Abnormal; Notable for the following:    Glucose-Capillary 135 (*)     All other components within normal limits  CBC WITH DIFFERENTIAL  BASIC METABOLIC PANEL   No results found.   Dx 1: Headache   MDM  Headache, similar to prior. No significant hyperglycemia. Doubt intracranial hemorrhage, meningitis, encephalitis. Good symptom improvement after ibuprofen and food. Patient is right for discharge home.        Shaaron Adler, [place] 11/04/11 [phone]

## 2011-11-04 NOTE — ED Notes (Signed)
Pt presenting to ed with c/o "I thought my sugar was high that's why I came to the er but they checked it and it was 135 but my head hurts it's probably because I have not ate anything all day". Pt is alert and oriented at this time

## 2011-11-10 ENCOUNTER — Emergency Department (HOSPITAL_COMMUNITY)
Admission: EM | Admit: 2011-11-10 | Discharge: 2011-11-10 | Disposition: A | Discharge status: Home / Self Care | Payer: Self-pay | Attending: Emergency Medicine | Admitting: Emergency Medicine

## 2011-11-10 ENCOUNTER — Encounter (HOSPITAL_COMMUNITY): Payer: Self-pay

## 2011-11-10 ENCOUNTER — Emergency Department (HOSPITAL_COMMUNITY): Payer: Self-pay

## 2011-11-10 DIAGNOSIS — Z794 Long term (current) use of insulin: Secondary | ICD-10-CM | POA: Insufficient documentation

## 2011-11-10 DIAGNOSIS — S93409A Sprain of unspecified ligament of unspecified ankle, initial encounter: Secondary | ICD-10-CM | POA: Insufficient documentation

## 2011-11-10 DIAGNOSIS — W19XXXA Unspecified fall, initial encounter: Secondary | ICD-10-CM | POA: Insufficient documentation

## 2011-11-10 DIAGNOSIS — S93402A Sprain of unspecified ligament of left ankle, initial encounter: Secondary | ICD-10-CM

## 2011-11-10 DIAGNOSIS — E119 Type 2 diabetes mellitus without complications: Secondary | ICD-10-CM | POA: Insufficient documentation

## 2011-11-10 DIAGNOSIS — S90812A Abrasion, left foot, initial encounter: Secondary | ICD-10-CM

## 2011-11-10 DIAGNOSIS — IMO0002 Reserved for concepts with insufficient information to code with codable children: Secondary | ICD-10-CM | POA: Insufficient documentation

## 2011-11-10 MED ORDER — TRAMADOL HCL 50 MG PO TABS: 50.0000 mg | ORAL_TABLET | Freq: Four times a day (QID) | ORAL | Status: AC | PRN

## 2011-11-10 MED ORDER — IBUPROFEN 600 MG PO TABS: 600.0000 mg | ORAL_TABLET | Freq: Four times a day (QID) | ORAL | Status: DC | PRN

## 2011-11-10 NOTE — ED Notes (Signed)
Patient transported to X-ray 

## 2011-11-10 NOTE — ED Provider Notes (Signed)
Medical screening examination/treatment/procedure(s) were performed by non-physician practitioner and as supervising physician I was immediately available for consultation/collaboration.   Gavin Pound. Oletta Lamas, MD 11/10/11 2337

## 2011-11-10 NOTE — ED Notes (Signed)
Pt was in high heels on Sunday and lost balance.  She fell onto her foot as it plantar flexed. She has an abrasion to top of the LT foot and swelling is noted.

## 2011-11-10 NOTE — ED Provider Notes (Signed)
History     CSN: [id number]  Arrival date & time 11/10/11  1912   First MD Initiated Contact with Patient 11/10/11 2049      Chief Complaint  Patient presents with  . Foot Pain    left side    (Consider location/radiation/quality/duration/timing/severity/associated sxs/prior treatment) HPI  21 year old female presents complaining of left foot pain. Patient reports several days ago she was wearing high heels and when she lost balance she fell and landed on the left foot.  C/o pain to top of L foot and L ankle.  Pain is described as sharp and throbbing that is constant but worsening with weightbearing or with palpation. Has notice swelling as well.  Denies any other injury.  Has abrasion in which she has been apply peroxide to.  Denies pain to L knee or hip.    Past Medical History  Diagnosis Date  . Diabetes mellitus     type 2 on insulin    Past Surgical History  Procedure Date  . No past surgeries     Family History  Problem Relation Age of Onset  . Anesthesia problems Neg Hx     History  Substance Use Topics  . Smoking status: Never Smoker   . Smokeless tobacco: Not on file  . Alcohol Use: Yes     occasionally    OB History    Grav Para Term Preterm Abortions TAB SAB Ect Mult Living   0 0 0 0 0 0 0 0 0 0       Review of Systems  All other systems reviewed and are negative.    Allergies  Review of patient's allergies indicates no known allergies.  Home Medications   Current Outpatient Rx  Name Route Sig Dispense Refill  . INSULIN ASPART 100 UNIT/ML [place] SOLN Subcutaneous Inject 12 Units into the skin 3 (three) times daily before meals.    . INSULIN GLARGINE 100 UNIT/ML [place] SOLN Subcutaneous Inject 40 Units into the skin at bedtime.       BP 125/63  Pulse 94  Temp 98.3 F (36.8 C) (Oral)  Resp 16  SpO2 100%  LMP 10/31/2011  Physical Exam  Nursing note and vitals reviewed. Constitutional: She appears well-developed and well-nourished.   Morbidly obese  HENT:  Head: Atraumatic.  Eyes: Conjunctivae are normal.  Neck: Neck supple.  Musculoskeletal:       Left hip: Normal.       Left knee: Normal.       Left ankle: She exhibits decreased range of motion and swelling. She exhibits no ecchymosis, no deformity, no laceration and normal pulse. tenderness. Lateral malleolus and medial malleolus tenderness found. No head of 5th metatarsal and no proximal fibula tenderness found.       Left foot: She exhibits tenderness and swelling. She exhibits normal range of motion, no bony tenderness, normal capillary refill, no crepitus and no deformity.       Feet:  Neurological: She is alert.  Skin: Skin is warm.    ED Course  Procedures (including critical care time)  Labs Reviewed - No data to display Dg Ankle Complete Left  11/10/2011  *RADIOLOGY REPORT*  Clinical Data: Fall.  Left ankle pain.  LEFT ANKLE COMPLETE - 3+ VIEW  Comparison: Foot views on the same day  Findings: There is no evidence for acute fracture or dislocation. No soft tissue foreign body or gas identified.  Mortise is intact.  IMPRESSION: Negative exam.  Original Report Authenticated By: Blair Hailey.  Manson Passey, M.D.   Dg Foot Complete Left  11/10/2011  *RADIOLOGY REPORT*  Clinical Data: Fall.  Ankle pain.  Foot pain.  LEFT FOOT - COMPLETE 3+ VIEW  Comparison: None.  Findings: There is mild soft tissue swelling in the region of the forefoot.  No evidence for acute fracture or subluxation.  No radiopaque foreign body or soft tissue gas.  IMPRESSION:  1.  Soft tissue swelling. 2. No evidence for acute osseous abnormality.  Original Report Authenticated By: Patterson Hammersmith, M.D.     No diagnosis found.  1. L ankle sprain 2. L foot abrasion  MDM  L ankle and foot injury.  Xray neg for fx.  Abrasion and swelling noted.  Will apply ASO, give crutches, RICE therapy and f/u referral.          Fayrene Helper, PA-C 11/10/11 2104

## 2011-11-15 ENCOUNTER — Encounter (HOSPITAL_COMMUNITY): Payer: Self-pay | Admitting: Emergency Medicine

## 2011-11-15 ENCOUNTER — Emergency Department (HOSPITAL_COMMUNITY): Payer: Self-pay

## 2011-11-15 ENCOUNTER — Emergency Department (HOSPITAL_COMMUNITY)
Admission: EM | Admit: 2011-11-15 | Discharge: 2011-11-15 | Disposition: A | Discharge status: Home / Self Care | Payer: Self-pay | Attending: Emergency Medicine | Admitting: Emergency Medicine

## 2011-11-15 DIAGNOSIS — L03119 Cellulitis of unspecified part of limb: Secondary | ICD-10-CM | POA: Insufficient documentation

## 2011-11-15 DIAGNOSIS — E119 Type 2 diabetes mellitus without complications: Secondary | ICD-10-CM | POA: Insufficient documentation

## 2011-11-15 DIAGNOSIS — W010XXA Fall on same level from slipping, tripping and stumbling without subsequent striking against object, initial encounter: Secondary | ICD-10-CM | POA: Insufficient documentation

## 2011-11-15 DIAGNOSIS — Z794 Long term (current) use of insulin: Secondary | ICD-10-CM | POA: Insufficient documentation

## 2011-11-15 DIAGNOSIS — L02419 Cutaneous abscess of limb, unspecified: Secondary | ICD-10-CM | POA: Insufficient documentation

## 2011-11-15 DIAGNOSIS — IMO0002 Reserved for concepts with insufficient information to code with codable children: Secondary | ICD-10-CM | POA: Insufficient documentation

## 2011-11-15 DIAGNOSIS — R739 Hyperglycemia, unspecified: Secondary | ICD-10-CM

## 2011-11-15 DIAGNOSIS — L03116 Cellulitis of left lower limb: Secondary | ICD-10-CM

## 2011-11-15 LAB — CBC WITH DIFFERENTIAL/PLATELET
Basophils Absolute: 0 10*3/uL (ref 0.0–0.1)
Eosinophils Relative: 3 % (ref 0–5)
Lymphocytes Relative: 37 % (ref 12–46)
Lymphs Abs: 2.9 10*3/uL (ref 0.7–4.0)
MCV: 70.1 fL — ABNORMAL LOW (ref 78.0–100.0)
Neutro Abs: 4.1 10*3/uL (ref 1.7–7.7)
Neutrophils Relative %: 53 % (ref 43–77)
Platelets: 496 10*3/uL — ABNORMAL HIGH (ref 150–400)
RBC: 4.58 MIL/uL (ref 3.87–5.11)
WBC: 7.8 10*3/uL (ref 4.0–10.5)

## 2011-11-15 LAB — POCT I-STAT, CHEM 8
BUN: 11 mg/dL (ref 6–23)
Chloride: 101 mEq/L (ref 96–112)
Creatinine, Ser: 0.7 mg/dL (ref 0.50–1.10)
Potassium: 3.4 mEq/L — ABNORMAL LOW (ref 3.5–5.1)
Sodium: 139 mEq/L (ref 135–145)

## 2011-11-15 MED ORDER — NAPROXEN 500 MG PO TABS: 500.0000 mg | ORAL_TABLET | Freq: Two times a day (BID) | ORAL | Status: DC

## 2011-11-15 MED ORDER — SULFAMETHOXAZOLE-TRIMETHOPRIM 800-160 MG PO TABS: 1.0000 | ORAL_TABLET | Freq: Two times a day (BID) | ORAL | Status: AC

## 2011-11-15 MED ORDER — HYDROCODONE-ACETAMINOPHEN 5-500 MG PO TABS: 1.0000 | ORAL_TABLET | Freq: Four times a day (QID) | ORAL | Status: AC | PRN

## 2011-11-15 MED ORDER — VANCOMYCIN HCL IN DEXTROSE 1-5 GM/200ML-% IV SOLN
1000.0000 mg | Freq: Once | INTRAVENOUS | Status: AC
  Administered 2011-11-15: 1000 mg via INTRAVENOUS
  Filled 2011-11-15: qty 200

## 2011-11-15 MED ORDER — OXYCODONE-ACETAMINOPHEN 5-325 MG PO TABS
2.0000 | ORAL_TABLET | Freq: Once | ORAL | Status: AC
  Administered 2011-11-15: 2 via ORAL
  Filled 2011-11-15: qty 2

## 2011-11-15 MED ORDER — SODIUM CHLORIDE 0.9 % IV BOLUS (SEPSIS)
1000.0000 mL | Freq: Once | INTRAVENOUS | Status: AC
  Administered 2011-11-15: 1000 mL via INTRAVENOUS

## 2011-11-15 NOTE — ED Notes (Signed)
Pt states she twisted her ankle last Sunday while walking in heels. Pt was seen her at [facility] but the swelling & pain has increased. Pt denies any reinjury. Pt placed on stretcher with leg elevated.

## 2011-11-15 NOTE — ED Provider Notes (Signed)
History     CSN: [id number]  Arrival date & time 11/15/11  0121   First MD Initiated Contact with Patient 11/15/11 [phone]      Chief Complaint  Patient presents with  . Ankle Pain    (Consider location/radiation/quality/duration/timing/severity/associated sxs/prior treatment) HPI Comments: 21 year old female who is a diabetic who presents with complaint of left foot and ankle swelling. She states that approximately one week ago she tripped and fell spraining her ankle and causing her foot to hyper plantar flex underneath her body. She sustained small abrasions to the top of the foot both in the dorsal ankle crease as well as on the distal foot. Since that time she has had persistent swelling of the foot over the last couple of days the swelling has become worse and now she has some drainage which looks like watery and purulent material which does not smell foul from both of the wounds. The swelling is persistent, gradually worsening, not associated with fevers chills nausea or vomiting. She also denies shortness of breath, chest pain, belly pain, back pain. She states that she takes her diabetic medications but not as good as she should.  Patient is a 21 y.o. female presenting with ankle pain. The history is provided by the patient, medical records and a relative.  Ankle Pain     Past Medical History  Diagnosis Date  . Diabetes mellitus     type 2 on insulin    Past Surgical History  Procedure Date  . No past surgeries     Family History  Problem Relation Age of Onset  . Anesthesia problems Neg Hx     History  Substance Use Topics  . Smoking status: Never Smoker   . Smokeless tobacco: Not on file  . Alcohol Use: Yes     occasionally    OB History    Grav Para Term Preterm Abortions TAB SAB Ect Mult Living   0 0 0 0 0 0 0 0 0 0       Review of Systems  All other systems reviewed and are negative.    Allergies  Review of patient's allergies indicates no known  allergies.  Home Medications   Current Outpatient Rx  Name Route Sig Dispense Refill  . INSULIN ASPART 100 UNIT/ML [place] SOLN Subcutaneous Inject 12 Units into the skin 3 (three) times daily before meals.    . INSULIN GLARGINE 100 UNIT/ML [place] SOLN Subcutaneous Inject 40 Units into the skin at bedtime.     Marland Kitchen HYDROCODONE-ACETAMINOPHEN 5-500 MG PO TABS Oral Take 1-2 tablets by mouth every 6 (six) hours as needed for pain. 15 tablet 0  . NAPROXEN 500 MG PO TABS Oral Take 1 tablet (500 mg total) by mouth 2 (two) times daily with a meal. 30 tablet 0  . SULFAMETHOXAZOLE-TRIMETHOPRIM 800-160 MG PO TABS Oral Take 1 tablet by mouth every 12 (twelve) hours. 20 tablet 0  . TRAMADOL HCL 50 MG PO TABS Oral Take 1 tablet (50 mg total) by mouth every 6 (six) hours as needed for pain. 15 tablet 0    BP 114/63  Pulse 92  Temp 98.1 F (36.7 C) (Oral)  Resp 22  Ht 5\' 1"  (1.549 m)  Wt 225 lb (102.059 kg)  BMI 42.51 kg/m2  SpO2 99%  LMP 10/31/2011  Physical Exam  Nursing note and vitals reviewed. Constitutional: She appears well-developed and well-nourished. No distress.  HENT:  Head: Normocephalic and atraumatic.  Mouth/Throat: Oropharynx is clear and moist. No oropharyngeal  exudate.  Eyes: Conjunctivae and EOM are normal. Pupils are equal, round, and reactive to light. Right eye exhibits no discharge. Left eye exhibits no discharge. No scleral icterus.  Neck: Normal range of motion. Neck supple. No JVD present. No thyromegaly present.  Cardiovascular: Normal rate, regular rhythm, normal heart sounds and intact distal pulses.  Exam reveals no gallop and no friction rub.   No murmur heard. Pulmonary/Chest: Effort normal and breath sounds normal. No respiratory distress. She has no wheezes. She has no rales.  Abdominal: Soft. Bowel sounds are normal. She exhibits no distension and no mass. There is no tenderness.  Musculoskeletal: Normal range of motion. She exhibits tenderness ( Left foot with tenderness  and swelling to the dorsum up to the ankle. There is no pain or swelling about the ankle. There is normal pedal pulses). She exhibits no edema.  Lymphadenopathy:    She has no cervical adenopathy.  Neurological: She is alert. Coordination normal.  Skin: Skin is warm and dry.       Left foot, dorsum with 2 separate wounds, no purulent material was able to be expressed from the wound, there is some serosanguineous material.  Psychiatric: She has a normal mood and affect. Her behavior is normal.    ED Course  Procedures (including critical care time)  Labs Reviewed  CBC WITH DIFFERENTIAL - Abnormal; Notable for the following:    Hemoglobin 10.7 (*)     HCT 32.1 (*)     MCV 70.1 (*)     MCH 23.4 (*)     Platelets 496 (*)     All other components within normal limits  POCT I-STAT, CHEM 8 - Abnormal; Notable for the following:    Potassium 3.4 (*)     Glucose, Bld 243 (*)     Hemoglobin 11.9 (*)     HCT 35.0 (*)     All other components within normal limits   Dg Foot Complete Left  11/15/2011  *RADIOLOGY REPORT*  Clinical Data: The patient fell 1 week ago.  Abrasions over the tarsals.  Infection.  LEFT FOOT - COMPLETE 3+ VIEW  Comparison: 11/10/2011  Findings: Soft tissue swelling over the dorsum of the left foot to. This is progressing since the previous study.  No soft tissue radiopaque foreign bodies or gas collections.  Underlying bones appear intact.  No evidence of acute fracture or subluxation.  No focal bone lesion or bone destruction.  No cortical reaction or sclerosis.  IMPRESSION: Dorsal soft tissue swelling.  No evidence of acute fracture or subluxation.  Nothing to suggest osteomyelitis.  Original Report Authenticated By: Marlon Pel, M.D.     1. Cellulitis of left foot   2. Hyperglycemia       MDM  At this time the patient is in no acute distress, her vital signs are normal and her foot appears to be infected from the abrasions from a fall. Will obtain an x-ray to  rule out osteomyelitis, IV antibiotics, basic labs to evaluate for hyperglycemia and leukocytosis, IV antibiotics and anticipate discharge. I discussed with the patient at length the need for further evaluation with a possible MRI should her symptoms worsen despite his antibiotic therapy.  No leukocytosis, slight hyperglycemia, x-ray shows no signs of osteomyelitis. The patient has been given intravenous vancomycin and pain medication, overall is well appearing, will be stable for discharge. I have instructed her to return within 48 hours for a recheck or sooner if her symptoms worsen.  Discharge  Prescriptions include:  Naprosyn Hydrocodone Bactrim for 10 days.  Vida Roller, MD 11/15/11 [phone]

## 2011-11-15 NOTE — ED Notes (Signed)
Pt tripped and fell last week, L ankle/foot pain, pt states now leaking fluid from wound. Swelling noted.

## 2012-01-13 ENCOUNTER — Encounter (HOSPITAL_COMMUNITY): Payer: Self-pay | Admitting: Emergency Medicine

## 2012-01-13 ENCOUNTER — Emergency Department (HOSPITAL_COMMUNITY): Payer: Self-pay

## 2012-01-13 ENCOUNTER — Emergency Department (HOSPITAL_COMMUNITY)
Admission: EM | Admit: 2012-01-13 | Discharge: 2012-01-13 | Disposition: A | Discharge status: Home / Self Care | Payer: Self-pay | Attending: Emergency Medicine | Admitting: Emergency Medicine

## 2012-01-13 DIAGNOSIS — E119 Type 2 diabetes mellitus without complications: Secondary | ICD-10-CM | POA: Insufficient documentation

## 2012-01-13 DIAGNOSIS — R509 Fever, unspecified: Secondary | ICD-10-CM | POA: Insufficient documentation

## 2012-01-13 DIAGNOSIS — J189 Pneumonia, unspecified organism: Secondary | ICD-10-CM

## 2012-01-13 DIAGNOSIS — J069 Acute upper respiratory infection, unspecified: Secondary | ICD-10-CM | POA: Insufficient documentation

## 2012-01-13 DIAGNOSIS — Z794 Long term (current) use of insulin: Secondary | ICD-10-CM | POA: Insufficient documentation

## 2012-01-13 MED ORDER — DOXYCYCLINE HYCLATE 100 MG PO CAPS: 100.0000 mg | ORAL_CAPSULE | Freq: Two times a day (BID) | ORAL | Status: DC

## 2012-01-13 MED ORDER — PROMETHAZINE-DM 6.25-15 MG/5ML PO SYRP: 5.0000 mL | ORAL_SOLUTION | Freq: Four times a day (QID) | ORAL | Status: DC | PRN

## 2012-01-13 MED ORDER — GUAIFENESIN ER 1200 MG PO TB12: 1.0000 | ORAL_TABLET | Freq: Two times a day (BID) | ORAL | Status: DC

## 2012-01-13 NOTE — ED Provider Notes (Signed)
History     CSN: [id number]  Arrival date & time 01/13/12  0003   First MD Initiated Contact with Patient 01/13/12 [phone]      Chief Complaint  Patient presents with  . Fever  . URI    (Consider location/radiation/quality/duration/timing/severity/associated sxs/prior treatment) HPI The patient presents to the emergency department for URI symptoms.  She states her symptoms started a couple days ago and has had no relief with OTC medications.  She reports nasal congestion, non-productive cough, dry throat, and low-grade fevers.  She denies headache, sob, chest pain, abdominal pain, nausea, vomiting, diarrhea, and sick contacts.    Past Medical History  Diagnosis Date  . Diabetes mellitus     type 2 on insulin    Past Surgical History  Procedure Date  . No past surgeries     Family History  Problem Relation Age of Onset  . Anesthesia problems Neg Hx     History  Substance Use Topics  . Smoking status: Never Smoker   . Smokeless tobacco: Not on file  . Alcohol Use: Yes     occasionally    OB History    Grav Para Term Preterm Abortions TAB SAB Ect Mult Living   0 0 0 0 0 0 0 0 0 0       Review of Systems All pertinent positives and negatives in the history of present illness   Allergies  Review of patient's allergies indicates no known allergies.  Home Medications   Current Outpatient Rx  Name Route Sig Dispense Refill  . INSULIN ASPART 100 UNIT/ML [place] SOLN Subcutaneous Inject 12 Units into the skin 3 (three) times daily before meals.    . INSULIN GLARGINE 100 UNIT/ML  SOLN Subcutaneous Inject 40 Units into the skin at bedtime.       BP 111/70  Pulse 105  Temp 97.8 F (36.6 C) (Oral)  Resp 16  SpO2 98%  LMP 12/14/2011  Physical Exam  Constitutional: She is oriented to person, place, and time. She appears well-developed and well-nourished.  HENT:  Head: Normocephalic and atraumatic.  Right Ear: External ear normal.  Left Ear: External ear normal.    Nose: Rhinorrhea present.  Mouth/Throat: Mucous membranes are normal. Posterior oropharyngeal erythema present. No oropharyngeal exudate or posterior oropharyngeal edema.  Eyes: Conjunctivae normal and EOM are normal. Pupils are equal, round, and reactive to light.  Neck: Normal range of motion. Neck supple.  Cardiovascular: Normal rate, regular rhythm and normal heart sounds.   Pulmonary/Chest: Effort normal and breath sounds normal. No respiratory distress. She has no wheezes. She has no rales. She exhibits no tenderness.  Abdominal: Soft. Bowel sounds are normal. She exhibits no distension and no mass. There is no tenderness. There is no rebound and no guarding.  Lymphadenopathy:    She has no cervical adenopathy.  Neurological: She is alert and oriented to person, place, and time.  Skin: Skin is warm and dry.    ED Course  Procedures (including critical care time)  Labs Reviewed - No data to display Dg Chest 2 View  01/13/2012  *RADIOLOGY REPORT*  Clinical Data: Cough, fever.  CHEST - 2 VIEW  Comparison: 01/30/2008  Findings: Mild central peribronchial thickening and mild streaky right perihilar opacity.  Otherwise, no consolidation, pleural effusion, or pneumothorax.  Cardiomediastinal contours otherwise within normal range.  No acute osseous finding.  IMPRESSION: Central peribronchial thickening is a nonspecific pattern that may reflect a viral infection or bronchitis.  Mild streaky right perihilar  opacity may reflect atelectasis or early infiltrate.   Original Report Authenticated By: Waneta Martins, M.D.    Patient will be treated for a possible early pneumonia, based on her chest x-ray.  Patient is advised return here for any worsening in her condition.  The patient is advised to increase her fluid intake and rest as much possible.     MDM  MDM Reviewed: nursing note and vitals Interpretation: x-ray           Carlyle Dolly, PA-C 01/13/12 [phone]

## 2012-01-13 NOTE — ED Notes (Signed)
Pt c/o sneezing, coughing, and congestion that started 3-5 days prior. Pt thought it was just allergies.

## 2012-01-13 NOTE — ED Provider Notes (Signed)
Medical screening examination/treatment/procedure(s) were performed by non-physician practitioner and as supervising physician I was immediately available for consultation/collaboration.    Vida Roller, MD 01/13/12 [phone]

## 2012-01-13 NOTE — ED Notes (Signed)
Pt alert, arrives from home, c/o fever, cough, onset was three days ago, resp even unlabored, skin pwd, pt states "half bottle of tussin"

## 2012-08-10 ENCOUNTER — Encounter (HOSPITAL_COMMUNITY): Payer: Self-pay | Admitting: *Deleted

## 2012-08-10 ENCOUNTER — Emergency Department (HOSPITAL_COMMUNITY)
Admission: EM | Admit: 2012-08-10 | Discharge: 2012-08-10 | Disposition: A | Discharge status: Home / Self Care | Payer: BC Managed Care – PPO | Attending: Emergency Medicine | Admitting: Emergency Medicine

## 2012-08-10 ENCOUNTER — Emergency Department (HOSPITAL_COMMUNITY): Payer: BC Managed Care – PPO

## 2012-08-10 DIAGNOSIS — J3489 Other specified disorders of nose and nasal sinuses: Secondary | ICD-10-CM | POA: Insufficient documentation

## 2012-08-10 DIAGNOSIS — R059 Cough, unspecified: Secondary | ICD-10-CM | POA: Insufficient documentation

## 2012-08-10 DIAGNOSIS — R509 Fever, unspecified: Secondary | ICD-10-CM | POA: Insufficient documentation

## 2012-08-10 DIAGNOSIS — Z794 Long term (current) use of insulin: Secondary | ICD-10-CM | POA: Insufficient documentation

## 2012-08-10 DIAGNOSIS — E119 Type 2 diabetes mellitus without complications: Secondary | ICD-10-CM | POA: Insufficient documentation

## 2012-08-10 DIAGNOSIS — M542 Cervicalgia: Secondary | ICD-10-CM | POA: Insufficient documentation

## 2012-08-10 DIAGNOSIS — R062 Wheezing: Secondary | ICD-10-CM | POA: Insufficient documentation

## 2012-08-10 DIAGNOSIS — R05 Cough: Secondary | ICD-10-CM | POA: Insufficient documentation

## 2012-08-10 MED ORDER — ACETAMINOPHEN 325 MG PO TABS
650.0000 mg | ORAL_TABLET | Freq: Once | ORAL | Status: AC
  Administered 2012-08-10: 650 mg via ORAL
  Filled 2012-08-10: qty 2

## 2012-08-10 MED ORDER — PREDNISONE 20 MG PO TABS
60.0000 mg | ORAL_TABLET | Freq: Once | ORAL | Status: AC
  Administered 2012-08-10: 60 mg via ORAL
  Filled 2012-08-10: qty 3

## 2012-08-10 MED ORDER — ALBUTEROL SULFATE (5 MG/ML) 0.5% IN NEBU
2.5000 mg | INHALATION_SOLUTION | Freq: Once | RESPIRATORY_TRACT | Status: AC
  Administered 2012-08-10: 2.5 mg via RESPIRATORY_TRACT
  Filled 2012-08-10: qty 0.5

## 2012-08-10 MED ORDER — ALBUTEROL SULFATE HFA 108 (90 BASE) MCG/ACT IN AERS
2.0000 | INHALATION_SPRAY | RESPIRATORY_TRACT | Status: DC | PRN
  Administered 2012-08-10: 2 via RESPIRATORY_TRACT
  Filled 2012-08-10: qty 6.7

## 2012-08-10 MED ORDER — PREDNISONE 20 MG PO TABS: 40.0000 mg | ORAL_TABLET | Freq: Every day | ORAL | Status: DC

## 2012-08-10 MED ORDER — ALBUTEROL SULFATE HFA 108 (90 BASE) MCG/ACT IN AERS: 2.0000 | INHALATION_SPRAY | RESPIRATORY_TRACT | Status: DC | PRN

## 2012-08-10 MED ORDER — IPRATROPIUM BROMIDE 0.02 % IN SOLN
0.5000 mg | Freq: Once | RESPIRATORY_TRACT | Status: AC
  Administered 2012-08-10: 0.5 mg via RESPIRATORY_TRACT
  Filled 2012-08-10: qty 2.5

## 2012-08-10 NOTE — ED Provider Notes (Signed)
History  This chart was scribed for non-physician practitioner working with Loren Racer, MD by Greggory Stallion, ED scribe. This patient was seen in room WTR7/WTR7 and the patient's care was started at 3:04 PM.  CSN: [id number]  Arrival date & time 08/10/12  1450    No chief complaint on file.   The history is provided by the patient. No language interpreter was used.   Andrea [name] is a 22 y.o. female with h/o diabetes who presents to the Emergency Department with chief complaint of constant, worsening nasal congestion that started 2-3 days ago with associated mild neck pain, fever, and productive cough. Pt states SOB started today. Pt denies h/o asthma but she states she does have environmental allergies. Pt states she has taken night time medicine and Delsym with no relief. Pt denies chills, nausea, vomiting, diarrhea, weakness, and any other pain. She denies getting a flu vaccination.    Past Medical History  Diagnosis Date  . Diabetes mellitus     type 2 on insulin    Past Surgical History  Procedure Laterality Date  . No past surgeries      Family History  Problem Relation Age of Onset  . Anesthesia problems Neg Hx     History  Substance Use Topics  . Smoking status: Never Smoker   . Smokeless tobacco: Not on file  . Alcohol Use: Yes     Comment: occasionally    OB History   Grav Para Term Preterm Abortions TAB SAB Ect Mult Living   0 0 0 0 0 0 0 0 0 0       Review of Systems A complete 10 system review of systems was obtained and all systems are negative except as noted in the HPI and PMH.    Allergies  Review of patient's allergies indicates no known allergies.  Home Medications   Current Outpatient Rx  Name  Route  Sig  Dispense  Refill  . doxycycline (VIBRAMYCIN) 100 MG capsule   Oral   Take 1 capsule (100 mg total) by mouth 2 (two) times daily.   14 capsule   0   . Guaifenesin 1200 MG TB12   Oral   Take 1 tablet (1,200 mg total) by mouth  2 (two) times daily.   20 each   0   . insulin aspart (NOVOLOG) 100 UNIT/ML injection   Subcutaneous   Inject 12 Units into the skin 3 (three) times daily before meals.         . insulin glargine (LANTUS) 100 UNIT/ML injection   Subcutaneous   Inject 40 Units into the skin at bedtime.          . promethazine-dextromethorphan (PROMETHAZINE-DM) 6.25-15 MG/5ML syrup   Oral   Take 5 mLs by mouth 4 (four) times daily as needed for cough.   120 mL   0     BP 121/77  Pulse 103  Temp(Src) 100.6 F (38.1 C) (Oral)  Resp 18  SpO2 94%  LMP 07/05/2012  Physical Exam  Nursing note and vitals reviewed. Constitutional: She is oriented to person, place, and time. She appears well-developed and well-nourished. No distress.  HENT:  Head: Normocephalic and atraumatic.  Eyes: EOM are normal.  Neck: Neck supple. No tracheal deviation present.  Cardiovascular: Normal rate, regular rhythm and normal heart sounds.  Exam reveals no gallop and no friction rub.   No murmur heard. Pulmonary/Chest: Effort normal. No respiratory distress. She has wheezes.  Moderate end expiratory wheezes  bilaterally.   Musculoskeletal: Normal range of motion.  Neurological: She is alert and oriented to person, place, and time.  Skin: Skin is warm and dry.  Psychiatric: She has a normal mood and affect. Her behavior is normal.    ED Course  Procedures (including critical care time)  DIAGNOSTIC STUDIES: Oxygen Saturation is 94% on RA, adequate by my interpretation.    COORDINATION OF CARE: 3:29 PM-Discussed treatment plan which includes chest xray and breathing treatment with pt at bedside and pt agreed to plan.   4:37 PM- Pt feels better after nebulizer treatments. Upon re-exam on lungs, she is not wheezing. Pt will be sent home with prednisone and an inhaler.   4:50 PM Patient dropped sat to 92 with ambulation.  Will give an additional breathing treatment.  Labs Reviewed - No data to display Dg  Chest 2 View  08/10/2012  *RADIOLOGY REPORT*  Clinical Data:  Shortness of breath.  CHEST - 2 VIEW  Comparison: 01/13/2012  Findings: The heart size and mediastinal contours are within normal limits.  Both lungs are clear.  The visualized skeletal structures are unremarkable.  IMPRESSION: No active disease.   Original Report Authenticated By: Irish Lack, M.D.      1. Wheezing       MDM  Patient with wheezing.  Tylenol and nebulizer and prednisone given in the ED.  Suspect this to be due to the pollen and seasonal allergies.  Patient feels much better after nebulizer treatment.  No evidence of pneumonia.    Patient ambulated in ED with O2 saturations maintained >90, no current signs of respiratory distress. Lung exam improved after nebulizer treatment. Prednisone given in the ED and pt will bd dc with 5 day burst. Pt states they are breathing at baseline. Pt has been instructed to continue using prescribed medications and to speak with PCP about today's exacerbation.    Will discharge home with inhaler and prednisone. Tylenol and motrin for fevers.  Return for worsening symptoms.     I personally performed the services described in this documentation, which was scribed in my presence. The recorded information has been reviewed and is accurate.     Roxy Horseman, PA-C 08/10/12 1734

## 2012-08-10 NOTE — Progress Notes (Signed)
Patient instructed on proper use of mdi with spacer by RT. Patient demonstrated understanding and good technique

## 2012-08-10 NOTE — ED Notes (Signed)
Ambulated pt in hallway, oxygen sat were between 92-94%, PA made aware.

## 2012-08-10 NOTE — ED Notes (Signed)
Pt states past 3 days has had congestion, then started having a cough and today has had trouble breathing.

## 2012-08-11 NOTE — ED Provider Notes (Signed)
Medical screening examination/treatment/procedure(s) were performed by non-physician practitioner and as supervising physician I was immediately available for consultation/collaboration.   Loren Racer, MD 08/11/12 [phone]

## 2012-10-31 ENCOUNTER — Encounter: Payer: Self-pay | Admitting: Internal Medicine

## 2012-10-31 ENCOUNTER — Ambulatory Visit (INDEPENDENT_AMBULATORY_CARE_PROVIDER_SITE_OTHER): Payer: BC Managed Care – PPO | Admitting: Internal Medicine

## 2012-10-31 VITALS — BP 110/78 | HR 90 | Temp 97.9°F | Resp 16 | Ht 62.75 in | Wt 242.5 lb

## 2012-10-31 DIAGNOSIS — IMO0001 Reserved for inherently not codable concepts without codable children: Secondary | ICD-10-CM

## 2012-10-31 LAB — CHOLESTEROL, TOTAL: Cholesterol: 158 mg/dL (ref 0–200)

## 2012-10-31 LAB — COMPREHENSIVE METABOLIC PANEL
AST: 12 U/L (ref 0–37)
Albumin: 3.9 g/dL (ref 3.5–5.2)
Alkaline Phosphatase: 70 U/L (ref 39–117)
BUN: 10 mg/dL (ref 6–23)
Creatinine, Ser: 0.6 mg/dL (ref 0.4–1.2)
Glucose, Bld: 215 mg/dL — ABNORMAL HIGH (ref 70–99)
Potassium: 3.9 mEq/L (ref 3.5–5.1)
Total Bilirubin: 0.2 mg/dL — ABNORMAL LOW (ref 0.3–1.2)

## 2012-10-31 LAB — CBC
HCT: 35.5 % — ABNORMAL LOW (ref 36.0–46.0)
Hemoglobin: 11.8 g/dL — ABNORMAL LOW (ref 12.0–15.0)
MCV: 73.6 fl — ABNORMAL LOW (ref 78.0–100.0)
Platelets: 444 10*3/uL — ABNORMAL HIGH (ref 150.0–400.0)
RBC: 4.82 Mil/uL (ref 3.87–5.11)
WBC: 6.3 10*3/uL (ref 4.5–10.5)

## 2012-10-31 LAB — MICROALBUMIN / CREATININE URINE RATIO
Creatinine,U: 102.8 mg/dL
Microalb Creat Ratio: 0.3 mg/g (ref 0.0–30.0)

## 2012-10-31 MED ORDER — ONETOUCH BASIC SYSTEM W/DEVICE KIT: PACK | Status: DC

## 2012-10-31 MED ORDER — INSULIN GLARGINE 100 UNIT/ML ~~LOC~~ SOLN: SUBCUTANEOUS | Status: DC

## 2012-10-31 MED ORDER — GLUCOSE BLOOD VI STRP: ORAL_STRIP | Status: DC

## 2012-10-31 MED ORDER — ONETOUCH LANCETS MISC: Status: DC

## 2012-10-31 NOTE — Progress Notes (Signed)
Patient ID: Andrea [name], female   DOB: 04-11-1991, 22 y.o.   MRN: [id number]  HPI: Andrea [name] is a 22 y.o.-year-old female, self-referred, for management of DM2, non-insulin-dependent, uncontrolled, without complications. She was previously seen by [facility] - closed, so not seen since 03/2012. Her father is also my atient.  Patient has been diagnosed with diabetes in 2012; she started insulin later that year. Last hemoglobin A1c was fairly good per her recall, but no exact value.   Pt is on a regimen of: - Lantus 40 hs units qhs (vial) - Novolog ~8 units tid ac - total 22 units a day (pen) Was on Metformin right after dx, but "did not work". She tolerated it well.  Pt does not check sugars as she does not have a meter. She feels the high CBGs as she develops polyuria and polidipsia. Not currently - happens seldom. ? lows. ? she has hypoglycemia awareness. Highest sugar was ~800 - at an admission 2013. Of note, in 2013, she had 6 ED visits (only 1 this year in 07/2012 for congestion - d/c'd on Pred taper)) for multiple complaints, some for increased CBGs. At the ED visit from 08/2011: mentioned she did not check sugars at home, as she did not have a meter, despite injecting insulin 4x a day. At tha time, she was on Lantus 15 bid + Novolog SSI. In 10/2011 she presented to the ED after running out of Novolog.   Pt's meals are: - Breakfast: eggs + bacon + toast; blueberry bagels + cream cheese - skips b'fast usually - Lunch: sandwiches - [place] or chicken - Dinner: chicken + rice + vegetables (corn) - Snacks: chips - 1; no candy, likes blueberry juice  Pt does not have chronic kidney disease, last BUN/creatinine was:  Lab Results  Component Value Date   BUN 11 11/15/2011   CREATININE 0.70 11/15/2011   Pt's last eye exam was in 06/2012. No DR. Denies numbness and tingling in her legs.  I reviewed her chart and she also has a history of a recent Prednisone taper for severe pollen  allergy, no other steroid use. She has a h/o cellulitis of L foot and ankle post fall >> ankle sprain.  Pt has FH of DM in father, mother, PGM.  ROS: Constitutional: + weight gain, + fatigue, no subjective hyperthermia/hypothermia Eyes: no blurry vision, no xerophthalmia ENT: no sore throat, no nodules palpated in throat, no dysphagia/odynophagia, no hoarseness Cardiovascular: no CP/SOB/palpitations/leg swelling Respiratory: no cough/SOB Gastrointestinal: no N/V/D/C Musculoskeletal: no muscle/joint aches Skin: no rashes Neurological: no tremors/numbness/tingling/dizziness Psychiatric: no depression/anxiety Low libido  Past Medical History  Diagnosis Date  . Diabetes mellitus     type 2 on insulin  . Chicken pox    Past Surgical History  Procedure Laterality Date  . No past surgeries     History   Social History  . Marital Status: married    Spouse Name: N/A    Number of Children: 0   Occupational History  . CMA   Social History Main Topics  . Smoking status: Never Smoker   . Smokeless tobacco: Not on file  . Alcohol Use: Yes     Comment: occasionally, white wine  . Drug Use: No  . Sexually Active: Yes    Birth Control/ Protection: None   Current Outpatient Prescriptions on File Prior to Visit  Medication Sig Dispense Refill  . albuterol (PROVENTIL HFA;VENTOLIN HFA) 108 (90 BASE) MCG/ACT inhaler Inhale 2 puffs into the lungs  every 4 (four) hours as needed for wheezing or shortness of breath.  1 Inhaler  3  . insulin aspart (NOVOLOG) 100 UNIT/ML injection Inject 12 Units into the skin 3 (three) times daily before meals.      . insulin glargine (LANTUS) 100 UNIT/ML injection Inject 40 Units into the skin at bedtime.        No current facility-administered medications on file prior to visit.   No Known Allergies  Family History  Problem Relation Age of Onset  . Anesthesia problems Neg Hx   . Stroke Father   . Hypertension Father   . Diabetes Mellitus II Father    . Irregular heart beat Father   . Heart disease Father   . Hyperlipidemia Father   . Diabetes Mellitus II Mother   . Hyperlipidemia Mother   . Thyroid disease Mother   . Hypertension Maternal Aunt   . Thyroid disease Maternal Aunt   . Diabetes Mellitus II Paternal Grandmother    PE: BP 110/78  Pulse 90  Temp(Src) 97.9 F (36.6 C) (Oral)  Resp 16  Ht 5' 2.75" (1.594 m)  Wt 242 lb 8 oz (109.997 kg)  BMI 43.29 kg/m2  SpO2 98%  LMP 10/09/2012 Wt Readings from Last 3 Encounters:  10/31/12 242 lb 8 oz (109.997 kg)  11/15/11 225 lb (102.059 kg)  08/19/11 235 lb (106.595 kg)   Constitutional: overweight, in NAD Eyes: PERRLA, EOMI, no exophthalmos ENT: moist mucous membranes, no thyromegaly, no cervical lymphadenopathy Cardiovascular: RRR, No MRG Respiratory: CTA B Gastrointestinal: abdomen soft, NT, ND, BS+ Musculoskeletal: no deformities, strength intact in all 4 Skin: moist, warm, no rashes Neurological: no tremor with outstretched hands, DTR normal in all 4  ASSESSMENT: 1. DM2, insulin-dependent, uncontrolled, without complications  PLAN:  1. Pt with with fairly recently discovered DM 2, with poor control per records, however I do not have records from patient's PCP, at [facility] She was not seen there since they closed in 03/2012. She had multiple visits to the ED for various reasons, including hypoglycemia. She is on a regimen of basal/bolus insulin, however not checking sugars at all since she does not have a meter. She considers her diabetes under control since she does not have polydipsia/polyuria/blurry vision. - It is very difficult to adjust her medication regimen without having any blood sugar values. Therefore, at this visit, we discussed about diabetes in general, insulin therapy and correct administration, possible complications, and the importance of checking sugars at different times of the day. I gave her sugar log and advised her how to fill it out,  initially starting by cheking sugars 2-3 times a day, before meals and at bedtime, rotating checks times. - given foot care handout and explained the principles - given instructions for hypoglycemia management "15-15 rule" - given general instructions about diabetes - Refilled Lantus pens - would like to switch to pens from syringes - Sent prescriptions for One Touch blood sugar monitor, strips, lancets to her pharmacy - since we have no recent labs for her, will check a hemoglobin A1c, CMP, CBC, lipid panel (she is fasting today), microalbumin/creatinine ratio - I will see her back in 3-4 weeks with her sugar log. I discussed with her that we probably will end up adding metformin to her regimen.  Office Visit on 10/31/2012  Component Date Value Range Status  . HM Diabetic Eye Exam 07/01/2012 no DR   Final  . Sodium 10/31/2012 136  135 - 145 mEq/L Final  .  Potassium 10/31/2012 3.9  3.5 - 5.1 mEq/L Final  . Chloride 10/31/2012 102  96 - 112 mEq/L Final  . CO2 10/31/2012 28  19 - 32 mEq/L Final  . Glucose, Bld 10/31/2012 215* 70 - 99 mg/dL Final  . BUN 16/01/9603 10  6 - 23 mg/dL Final  . Creatinine, Ser 10/31/2012 0.6  0.4 - 1.2 mg/dL Final  . Total Bilirubin 10/31/2012 0.2* 0.3 - 1.2 mg/dL Final  . Alkaline Phosphatase 10/31/2012 70  39 - 117 U/L Final  . AST 10/31/2012 12  0 - 37 U/L Final  . ALT 10/31/2012 13  0 - 35 U/L Final  . Total Protein 10/31/2012 8.0  6.0 - 8.3 g/dL Final  . Albumin 54/12/8117 3.9  3.5 - 5.2 g/dL Final  . Calcium 14/78/2956 9.2  8.4 - 10.5 mg/dL Final  . GFR 21/30/8657 156.69  >60.00 mL/min Final  . WBC 10/31/2012 6.3  4.5 - 10.5 K/uL Final  . RBC 10/31/2012 4.82  3.87 - 5.11 Mil/uL Final  . Platelets 10/31/2012 444.0* 150.0 - 400.0 K/uL Final  . Hemoglobin 10/31/2012 11.8* 12.0 - 15.0 g/dL Final  . HCT 84/69/6295 35.5* 36.0 - 46.0 % Final  . MCV 10/31/2012 73.6* 78.0 - 100.0 fl Final  . MCHC 10/31/2012 33.1  30.0 - 36.0 g/dL Final  . RDW 28/41/3244 17.0*  11.5 - 14.6 % Final  . Cholesterol 10/31/2012 158  0 - 200 mg/dL Final   ATP III Classification       Desirable:  < 200 mg/dL               Borderline High:  200 - 239 mg/dL          High:  > = 010 mg/dL  . Hemoglobin A1C 10/31/2012 11.2* 4.6 - 6.5 % Final   Glycemic Control Guidelines for People with Diabetes:Non Diabetic:  <6%Goal of Therapy: <7%Additional Action Suggested:  >8%   . Microalb, Ur 10/31/2012 0.3  0.0 - 1.9 mg/dL Final  . Creatinine,U 27/25/3664 102.8   Final  . Microalb Creat Ratio 10/31/2012 0.3  0.0 - 30.0 mg/g Final   Letter sent - poor DM control. No MAU. I intended to check a full lipid panel, but actually ordered the total cholesterol only. We'll recheck a lipid panel in 3-6 months. Low Hb and high Pt, with low MCV >> advised her to schedule an appointment with a new PCP and have this investigated further. The CBC abnormality can impact the measurement of hemoglobin A1c.Marland KitchenMarland Kitchen

## 2012-10-31 NOTE — Patient Instructions (Addendum)
Please return in 3-4 weeks with your sugar log. Check sugars 2-3 x a day.  Try to eat breakfast every day - can be a small breakfast e.g. fruit or a smoothie. Stay on the same insulin regimen for now.   PATIENT INSTRUCTIONS FOR TYPE 2 DIABETES:  **Please join MyChart!** - see attached instructions about how to join   DIET AND EXERCISE Diet and exercise is an important part of diabetic treatment.  We recommended aerobic exercise in the form of brisk walking (working between 40-60% of maximal aerobic capacity, similar to brisk walking) for 150 minutes per week (such as 30 minutes five days per week) along with 3 times per week performing 'resistance' training (using various gauge rubber tubes with handles) 5-10 exercises involving the major muscle groups (upper body, lower body and core) performing 10-15 repetitions (or near fatigue) each exercise. Start at half the above goal but build slowly to reach the above goals. If limited by weight, joint pain, or disability, we recommend daily walking in a swimming pool with water up to waist to reduce pressure from joints while allow for adequate exercise.    BLOOD GLUCOSES Monitoring your blood glucoses is important for continued management of your diabetes. Please check your blood glucoses 2-4 times a day: fasting, before meals and at bedtime (you can rotate these measurements - e.g. one day check before the 3 meals, the next day check before 2 of the meals and before bedtime, etc.   HYPOGLYCEMIA (low blood sugar) Hypoglycemia is usually a reaction to not eating, exercising, or taking too much insulin/ other diabetes drugs.  Symptoms include tremors, sweating, hunger, confusion, headache, etc. Treat IMMEDIATELY with 15 grams of Carbs:   4 glucose tablets    cup regular juice/soda   2 tablespoons raisins   4 teaspoons sugar   1 tablespoon honey Recheck blood glucose in 15 mins and repeat above if still symptomatic/blood glucose <100. Please contact  our office at [phone] if you have questions about how to next handle your insulin.  RECOMMENDATIONS TO REDUCE YOUR RISK OF DIABETIC COMPLICATIONS: * Take your prescribed MEDICATION(S). * Follow a DIABETIC diet: Complex carbs, fiber rich foods, heart healthy fish twice weekly, (monounsaturated and polyunsaturated) fats * AVOID saturated/trans fats, high fat foods, >2,300 mg salt per day. * EXERCISE at least 5 times a week for 30 minutes or preferably daily.  * DO NOT SMOKE OR DRINK more than 1 drink a day. * Check your FEET every day. Do not wear tightfitting shoes. Contact us if you develop an ulcer * See your EYE doctor once a year or more if needed * Get a FLU shot once a year * Get a PNEUMONIA vaccine once before and once after age 62 years  GOALS:  * Your Hemoglobin A1c of <7%  * Your Systolic BP should be 140 or lower  * Your Diastolic BP should be 80 or lower  * Your HDL (Good Cholesterol) should be 40 or higher  * Your LDL (Bad Cholesterol) should be 100 or lower  * Your Triglycerides should be 150 or lower  * Your Urine microalbumin (kidney function) should be <30 * Your Body Mass Index should be 25 or lower   We will be glad to help you achieve these goals. Our telephone number is: [phone].

## 2012-11-01 ENCOUNTER — Encounter: Payer: Self-pay | Admitting: Internal Medicine

## 2012-11-23 ENCOUNTER — Inpatient Hospital Stay (HOSPITAL_COMMUNITY)
Admission: AD | Admit: 2012-11-23 | Discharge: 2012-11-23 | Disposition: A | Discharge status: Home / Self Care | Payer: BC Managed Care – PPO | Source: Ambulatory Visit | Attending: Obstetrics & Gynecology | Admitting: Obstetrics & Gynecology

## 2012-11-23 ENCOUNTER — Encounter (HOSPITAL_COMMUNITY): Payer: Self-pay | Admitting: *Deleted

## 2012-11-23 DIAGNOSIS — N920 Excessive and frequent menstruation with regular cycle: Secondary | ICD-10-CM

## 2012-11-23 DIAGNOSIS — R109 Unspecified abdominal pain: Secondary | ICD-10-CM | POA: Insufficient documentation

## 2012-11-23 DIAGNOSIS — N921 Excessive and frequent menstruation with irregular cycle: Secondary | ICD-10-CM

## 2012-11-23 LAB — URINALYSIS, ROUTINE W REFLEX MICROSCOPIC
Bilirubin Urine: NEGATIVE
Nitrite: NEGATIVE
Specific Gravity, Urine: 1.02 (ref 1.005–1.030)
Urobilinogen, UA: 0.2 mg/dL (ref 0.0–1.0)
pH: 6 (ref 5.0–8.0)

## 2012-11-23 LAB — CBC
HCT: 33.9 % — ABNORMAL LOW (ref 36.0–46.0)
Hemoglobin: 11.3 g/dL — ABNORMAL LOW (ref 12.0–15.0)
MCH: 23.3 pg — ABNORMAL LOW (ref 26.0–34.0)
MCHC: 33.3 g/dL (ref 30.0–36.0)
RDW: 15.4 % (ref 11.5–15.5)

## 2012-11-23 LAB — WET PREP, GENITAL: Yeast Wet Prep HPF POC: NONE SEEN

## 2012-11-23 LAB — URINE MICROSCOPIC-ADD ON

## 2012-11-23 MED ORDER — NORGESTIMATE-ETH ESTRADIOL 0.25-35 MG-MCG PO TABS: 1.0000 | ORAL_TABLET | Freq: Every day | ORAL | Status: DC

## 2012-11-23 NOTE — MAU Note (Signed)
Pt states heavy bleeding x2 days, Pt states she is having cramping and back

## 2012-11-23 NOTE — MAU Provider Note (Signed)
Chief Complaint: Vaginal Bleeding and Abdominal Cramping   None    SUBJECTIVE HPI: Andrea [name] is a 22 y.o. G0P0000 who presents to maternity admissions reporting heavy irregular period starting 2 days ago.  She reports her menses are usually irregular but have not been this heavy.  She is sexually active and does not desire pregnancy.  She reports heavy bleeding with clots, and is soaking a pad every 2-3 hours.  She denies vaginal itching/burning, urinary symptoms, h/a, dizziness, n/v, or fever/chills.     Past Medical History  Diagnosis Date  . Diabetes mellitus     type 2 on insulin  . Chicken pox    Past Surgical History  Procedure Laterality Date  . No past surgeries     History   Social History  . Marital Status: Single    Spouse Name: N/A    Number of Children: N/A  . Years of Education: N/A   Occupational History  . Not on file.   Social History Main Topics  . Smoking status: Never Smoker   . Smokeless tobacco: Not on file  . Alcohol Use: Yes     Comment: occasionally  . Drug Use: No  . Sexual Activity: Yes    Birth Control/ Protection: None   Other Topics Concern  . Not on file   Social History Narrative  . No narrative on file   No current facility-administered medications on file prior to encounter.   Current Outpatient Prescriptions on File Prior to Encounter  Medication Sig Dispense Refill  . insulin aspart (NOVOLOG) 100 UNIT/ML injection Inject 15-20 Units into the skin 3 (three) times daily before meals. Patient is on sliding scale.      . insulin glargine (LANTUS) 100 UNIT/ML injection Inject 40 units at bedtime - Solostar pens  15 mL  3  . Blood Glucose Monitoring Suppl (ONE TOUCH BASIC SYSTEM) W/DEVICE KIT Use as advised  1 each  0  . glucose blood test strip Check sugars 3x a day  200 each  11  . ONE TOUCH LANCETS MISC Check sugars 3x a day  200 each  11   No Known Allergies  ROS: Pertinent items in HPI  OBJECTIVE Blood pressure  122/77, pulse 93, temperature 98.5 F (36.9 C), temperature source Oral, resp. rate 20, height 5' 2.75" (1.594 m), weight 111.222 kg (245 lb 3.2 oz), last menstrual period 11/22/2012, SpO2 100.00%. GENERAL: Well-developed, well-nourished female in no acute distress.  HEENT: Normocephalic HEART: normal rate RESP: normal effort ABDOMEN: Soft, non-tender EXTREMITIES: Nontender, no edema NEURO: Alert and oriented Pelvic exam: Cervix pink, visually closed, without lesion, moderate amount dark red blood, vaginal walls and external genitalia normal Bimanual exam: Cervix 0/long/high, firm, anterior, neg CMT, uterus nontender, nonenlarged, adnexa without tenderness, enlargement, or mass  LAB RESULTS Results for orders placed during the hospital encounter of 11/23/12 (from the past 24 hour(s))  URINALYSIS, ROUTINE W REFLEX MICROSCOPIC     Status: Abnormal   Collection Time    11/23/12 12:30 PM      Result Value Range   Color, Urine RED (*) YELLOW   APPearance CLOUDY (*) CLEAR   Specific Gravity, Urine 1.020  1.005 - 1.030   pH 6.0  5.0 - 8.0   Glucose, UA NEGATIVE  NEGATIVE mg/dL   Hgb urine dipstick LARGE (*) NEGATIVE   Bilirubin Urine NEGATIVE  NEGATIVE   Ketones, ur NEGATIVE  NEGATIVE mg/dL   Protein, ur 30 (*) NEGATIVE mg/dL   Urobilinogen,  UA 0.2  0.0 - 1.0 mg/dL   Nitrite NEGATIVE  NEGATIVE   Leukocytes, UA TRACE (*) NEGATIVE  URINE MICROSCOPIC-ADD ON     Status: None   Collection Time    11/23/12 12:30 PM      Result Value Range   Squamous Epithelial / LPF RARE  RARE   RBC / HPF 21-50  <3 RBC/hpf  POCT PREGNANCY, URINE     Status: None   Collection Time    11/23/12 12:54 PM      Result Value Range   Preg Test, Ur NEGATIVE  NEGATIVE  CBC     Status: Abnormal   Collection Time    11/23/12  4:23 PM      Result Value Range   WBC 6.2  4.0 - 10.5 K/uL   RBC 4.84  3.87 - 5.11 MIL/uL   Hemoglobin 11.3 (*) 12.0 - 15.0 g/dL   HCT 16.1 (*) 09.6 - 04.5 %   MCV 70.0 (*) 78.0 - 100.0  fL   MCH 23.3 (*) 26.0 - 34.0 pg   MCHC 33.3  30.0 - 36.0 g/dL   RDW 40.9  81.1 - 91.4 %   Platelets 409 (*) 150 - 400 K/uL    ASSESSMENT 1. Menometrorrhagia     PLAN Discharge home Sprintec 28 x3 refills F/U with Dr Henderson Cloud as desired for contraceptive management F/U with primary care/endocrinologist r/t diabetes management Return to MAU as needed    Medication List         glucose blood test strip  Check sugars 3x a day     insulin aspart 100 UNIT/ML injection  Commonly known as:  novoLOG  Inject 15-20 Units into the skin 3 (three) times daily before meals. Patient is on sliding scale.     insulin glargine 100 UNIT/ML injection  Commonly known as:  LANTUS  Inject 40 units at bedtime - Solostar pens     norgestimate-ethinyl estradiol 0.25-35 MG-MCG tablet  Commonly known as:  ORTHO-CYCLEN,SPRINTEC,PREVIFEM  Take 1 tablet by mouth daily.     ONE TOUCH BASIC SYSTEM W/DEVICE Kit  Use as advised     ONE TOUCH LANCETS Misc  Check sugars 3x a day           Follow-up Information   Schedule an appointment as soon as possible for a visit with HORVATH,MICHELLE A, MD.   Specialty:  Obstetrics and Gynecology   Contact information:   [address] [place] [place] [address] [phone]       Sharen Counter Certified Nurse-Midwife 11/23/2012  5:57 PM

## 2012-11-23 NOTE — MAU Note (Signed)
Patient states she had been spotting for a couple of days then started her period yesterday. States the bleeding is heavier than she has ever had with a period and is having cramping. Patient has on a full size pad that is about 1/2 covered with dark blood.

## 2012-11-24 LAB — GC/CHLAMYDIA PROBE AMP
CT Probe RNA: NEGATIVE
GC Probe RNA: NEGATIVE

## 2012-11-24 NOTE — MAU Provider Note (Signed)

## 2012-11-25 ENCOUNTER — Ambulatory Visit: Payer: BC Managed Care – PPO | Admitting: Internal Medicine

## 2012-11-25 DIAGNOSIS — Z0289 Encounter for other administrative examinations: Secondary | ICD-10-CM

## 2013-06-29 ENCOUNTER — Ambulatory Visit: Payer: BC Managed Care – PPO | Admitting: Dietician

## 2013-07-26 ENCOUNTER — Ambulatory Visit: Payer: BC Managed Care – PPO | Admitting: Dietician

## 2015-03-21 ENCOUNTER — Ambulatory Visit (INDEPENDENT_AMBULATORY_CARE_PROVIDER_SITE_OTHER): Payer: Self-pay | Admitting: Family Medicine

## 2015-03-21 DIAGNOSIS — Z3201 Encounter for pregnancy test, result positive: Secondary | ICD-10-CM

## 2015-03-21 DIAGNOSIS — O24111 Pre-existing diabetes mellitus, type 2, in pregnancy, first trimester: Secondary | ICD-10-CM

## 2015-03-21 LAB — POCT PREGNANCY, URINE: Preg Test, Ur: POSITIVE — AB

## 2015-03-21 NOTE — Progress Notes (Signed)
Pt has positive pregnancy test with certain lmp. 1st trimester screen scheduled and new ob labs drawn. Patient is existing type 2 diabetic. Scheduled to come see nancy.

## 2015-03-22 LAB — PRENATAL PROFILE (SOLSTAS)
ANTIBODY SCREEN: NEGATIVE
BASOS ABS: 0 10*3/uL (ref 0.0–0.1)
BASOS PCT: 0 % (ref 0–1)
EOS ABS: 0.2 10*3/uL (ref 0.0–0.7)
Eosinophils Relative: 2 % (ref 0–5)
HCT: 32.9 % — ABNORMAL LOW (ref 36.0–46.0)
HEMOGLOBIN: 10.7 g/dL — AB (ref 12.0–15.0)
HEP B S AG: NEGATIVE
HIV 1&2 Ab, 4th Generation: NONREACTIVE
LYMPHS ABS: 1.7 10*3/uL (ref 0.7–4.0)
Lymphocytes Relative: 22 % (ref 12–46)
MCH: 25.1 pg — AB (ref 26.0–34.0)
MCHC: 32.5 g/dL (ref 30.0–36.0)
MCV: 77.2 fL — AB (ref 78.0–100.0)
MPV: 9.3 fL (ref 8.6–12.4)
Monocytes Absolute: 0.4 10*3/uL (ref 0.1–1.0)
Monocytes Relative: 5 % (ref 3–12)
NEUTROS ABS: 5.5 10*3/uL (ref 1.7–7.7)
NEUTROS PCT: 71 % (ref 43–77)
Platelets: 393 10*3/uL (ref 150–400)
RBC: 4.26 MIL/uL (ref 3.87–5.11)
RDW: 15.3 % (ref 11.5–15.5)
Rh Type: POSITIVE
Rubella: 1.28 Index — ABNORMAL HIGH (ref <0.90)
WBC: 7.7 10*3/uL (ref 4.0–10.5)

## 2015-03-24 LAB — CULTURE, OB URINE: Colony Count: 25000

## 2015-03-25 ENCOUNTER — Encounter: Payer: Medicaid Other | Attending: Family Medicine | Admitting: *Deleted

## 2015-03-25 ENCOUNTER — Ambulatory Visit: Payer: Self-pay | Admitting: *Deleted

## 2015-03-25 VITALS — Ht 62.0 in | Wt 230.1 lb

## 2015-03-25 DIAGNOSIS — O24111 Pre-existing diabetes mellitus, type 2, in pregnancy, first trimester: Secondary | ICD-10-CM | POA: Diagnosis present

## 2015-03-25 DIAGNOSIS — Z713 Dietary counseling and surveillance: Secondary | ICD-10-CM | POA: Diagnosis not present

## 2015-03-25 LAB — CANNABANOIDS (GC/LC/MS), URINE: THC-COOH (GC/LC/MS), ur confirm: 50 ng/mL — AB (ref <5)

## 2015-03-25 MED ORDER — INSULIN ASPART 100 UNIT/ML ~~LOC~~ SOLN: 10.0000 [IU] | Freq: Three times a day (TID) | SUBCUTANEOUS | Status: DC

## 2015-03-25 MED ORDER — ONETOUCH ULTRASOFT LANCETS MISC: Status: DC

## 2015-03-25 MED ORDER — GLUCOSE BLOOD VI STRP: ORAL_STRIP | Status: DC

## 2015-03-25 NOTE — Progress Notes (Signed)
Patient was seen on 03/25/15 for Diabetes self-management .  Patient presents newly pregnant with a HX of T2DM. She presents for Diabetes Counseling. She is accompanied by her significant other. Cadyn is presently married and covered under her husbands insurance. She came in as self-pay because she did not want services billed to husbands insurance. She needs to have legal separation papers drawn and have him drop her from his insurance. In the mean time services must be filed through [employer]. She is presently taking lantus 40units AM with FBS of 112m/dl. She takes Novolog 10units TID with meals when she has the insulin. She notes that she goes too low when she takes 15 units. She is not testing her glucose. PLAN: Increase Lantus 1 units per day in the AM. Consistently take Novolog 10 units TID 15 minutes prior to each meal.  I will order to pharmacy for One Touch Verio Flex and testing supplies for testing 4 times daily. I will also order Novolog flex pen for 10u TID with Meals   The following learning objectives were met by the patient :   States when to check blood glucose levels  Demonstrates proper blood glucose monitoring techniques  States the effect of stress and exercise on blood glucose levels  States the importance of limiting caffeine and abstaining from alcohol and smoking  Plan:   Consider  increasing your activity level by walking daily as tolerated Begin checking BG before breakfast and 2 hours after first bit of breakfast, lunch and dinner after  as directed by MD  Take medication  as directed by MD  Patient instructed to monitor glucose levels: FBS: 60 - <90 2 hour: <120  Patient received the following handouts:  Nutrition Diabetes and Pregnancy  Carbohydrate Counting List  Meal Planning worksheet  Patient will be seen for follow-up as needed.

## 2015-03-25 NOTE — Progress Notes (Signed)
Nutrition note: DM diet education Pt has had Type 2 DM for ~4 years and has used insulin to control it from the beginning. Pt reports that her numbers were wnl and she lost wt but since pregnancy her BG have been elevated again. Pt has h/o obesity & pt has gained 12.1# @ 10w, which is > expected. Pt reports eating ~6x/d. Pt reports she has not started taking a PNV yet. Pt reports no N&V or heartburn.  Pt reports no walking or physical activity. Pt received verbal & written review of DM diet during pregnancy (pt reports she has received a lot of DM diet education but when discussing, pt did not know many concepts). Encouraged PNV daily. Encouraged ~30 mins of walking daily. Discussed wt gain goals of 11-20# or 0.5#/wk in 2nd & 3rd trimester. Pt agrees to follow DM diet with 3 meals & 1-3 snacks/d with proper CHO/ protein combination. Pt does not have WIC but plans to apply. Pt plans to BF. F/u in 4-6 wks Blondell RevealLaura Mychele Seyller, MS, RD, LDN, [facility]

## 2015-03-26 LAB — PRESCRIPTION MONITORING PROFILE (19 PANEL)
Amphetamine/Meth: NEGATIVE ng/mL
BUPRENORPHINE, URINE: NEGATIVE ng/mL
Barbiturate Screen, Urine: NEGATIVE ng/mL
Benzodiazepine Screen, Urine: NEGATIVE ng/mL
CREATININE, URINE: 119.66 mg/dL (ref >20.0)
Carisoprodol, Urine: NEGATIVE ng/mL
Cocaine Metabolites: NEGATIVE ng/mL
ECSTASY: NEGATIVE ng/mL
FENTANYL URINE: NEGATIVE ng/mL
MEPERIDINE UR: NEGATIVE ng/mL
METHADONE SCREEN, URINE: NEGATIVE ng/mL
METHAQUALONE SCREEN (URINE): NEGATIVE ng/mL
Nitrites, Initial: NEGATIVE ug/mL
Opiate Screen, Urine: NEGATIVE ng/mL
Oxycodone Screen, Ur: NEGATIVE ng/mL
PH URINE, INITIAL: 6.4 pH (ref 4.5–8.9)
PHENCYCLIDINE, UR: NEGATIVE ng/mL
Propoxyphene: NEGATIVE ng/mL
Tapentadol, urine: NEGATIVE ng/mL
Tramadol Scrn, Ur: NEGATIVE ng/mL
Zolpidem, Urine: NEGATIVE ng/mL

## 2015-04-01 ENCOUNTER — Ambulatory Visit: Payer: BC Managed Care – PPO

## 2015-04-15 ENCOUNTER — Encounter: Payer: Medicaid Other | Attending: Family Medicine | Admitting: *Deleted

## 2015-04-15 ENCOUNTER — Encounter: Payer: Self-pay | Admitting: Family Medicine

## 2015-04-15 ENCOUNTER — Ambulatory Visit (INDEPENDENT_AMBULATORY_CARE_PROVIDER_SITE_OTHER): Payer: BLUE CROSS/BLUE SHIELD | Admitting: Family Medicine

## 2015-04-15 VITALS — BP 121/67 | HR 90 | Wt 238.0 lb

## 2015-04-15 DIAGNOSIS — Z713 Dietary counseling and surveillance: Secondary | ICD-10-CM | POA: Insufficient documentation

## 2015-04-15 DIAGNOSIS — O24911 Unspecified diabetes mellitus in pregnancy, first trimester: Secondary | ICD-10-CM

## 2015-04-15 DIAGNOSIS — O99211 Obesity complicating pregnancy, first trimester: Secondary | ICD-10-CM

## 2015-04-15 DIAGNOSIS — O24111 Pre-existing diabetes mellitus, type 2, in pregnancy, first trimester: Secondary | ICD-10-CM | POA: Insufficient documentation

## 2015-04-15 DIAGNOSIS — O24311 Unspecified pre-existing diabetes mellitus in pregnancy, first trimester: Secondary | ICD-10-CM

## 2015-04-15 DIAGNOSIS — Z113 Encounter for screening for infections with a predominantly sexual mode of transmission: Secondary | ICD-10-CM | POA: Diagnosis not present

## 2015-04-15 DIAGNOSIS — O0991 Supervision of high risk pregnancy, unspecified, first trimester: Secondary | ICD-10-CM

## 2015-04-15 DIAGNOSIS — O0990 Supervision of high risk pregnancy, unspecified, unspecified trimester: Secondary | ICD-10-CM | POA: Insufficient documentation

## 2015-04-15 DIAGNOSIS — E669 Obesity, unspecified: Secondary | ICD-10-CM

## 2015-04-15 DIAGNOSIS — O24319 Unspecified pre-existing diabetes mellitus in pregnancy, unspecified trimester: Secondary | ICD-10-CM | POA: Insufficient documentation

## 2015-04-15 LAB — POCT URINALYSIS DIP (DEVICE)
Bilirubin Urine: NEGATIVE
GLUCOSE, UA: NEGATIVE mg/dL
Hgb urine dipstick: NEGATIVE
KETONES UR: NEGATIVE mg/dL
LEUKOCYTES UA: NEGATIVE
Nitrite: NEGATIVE
Protein, ur: NEGATIVE mg/dL
SPECIFIC GRAVITY, URINE: 1.02 (ref 1.005–1.030)
Urobilinogen, UA: 0.2 mg/dL (ref 0.0–1.0)
pH: 7 (ref 5.0–8.0)

## 2015-04-15 MED ORDER — PRENATAL VITAMINS 0.8 MG PO TABS: 1.0000 | ORAL_TABLET | Freq: Every day | ORAL | Status: DC

## 2015-04-15 MED ORDER — INSULIN ASPART 100 UNIT/ML ~~LOC~~ SOLN
24.0000 [IU] | Freq: Three times a day (TID) | SUBCUTANEOUS | Status: DC
Start: 2015-04-15 — End: 2015-06-03

## 2015-04-15 MED ORDER — INSULIN GLARGINE 100 UNIT/ML ~~LOC~~ SOLN: SUBCUTANEOUS | Status: DC

## 2015-04-15 MED ORDER — ASPIRIN EC 81 MG PO TBEC: 81.0000 mg | DELAYED_RELEASE_TABLET | Freq: Every day | ORAL | Status: DC

## 2015-04-15 NOTE — Progress Notes (Signed)
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 She presents with her significant other. This is her first pregnancy and she and the father are very excited they got to hear the heart beat today.She has not been testing her glucose regularly and has been using her mothers glucometer. DispGeorgeanne [name] Lot# [id number] Exp: 2016/01/11 Patient will monitor glucose readings and I will review next week.  The following learning objectives were met by the patient :   States why dietary management is important in controlling blood glucose  Describes the effects of carbohydrates on blood glucose levels  Demonstrates ability to create a balanced meal plan  States when to check blood glucose levels  Demonstrates proper blood glucose monitoring techniques  States the effect of stress and exercise on blood glucose levels  States the importance of limiting caffeine and abstaining from alcohol and smoking  Plan:  Aim for 2 Carb Choices per meal (30 grams) +/- 1 either way for breakfast Begin checking BG before breakfast and 2 hours after first bit of breakfast, lunch and dinner after  as directed by MD  Take medication  as directed by MD  Patient instructed to monitor glucose levels: FBS: 60 - <90 1 hour: <140 2 hour: <120

## 2015-04-15 NOTE — Patient Instructions (Addendum)
Type 1 or Type 2 Diabetes Mellitus During Pregnancy Diabetes mellitus, often simply referred to as diabetes, is a long-term (chronic) disease. Type 1 diabetes occurs when the islet cells, which are in the pancreas and make the hormone insulin, are destroyed and can no longer make insulin. Type 2 diabetes occurs when the pancreas does not make enough insulin, the cells are less responsive to the insulin that is made (insulin resistance), or both. Insulin is needed to move sugars from food into the tissue cells. The tissue cells use the sugars for energy. The lack of insulin or the lack of normal response to insulin causes excess sugars to build up in the blood instead of going into the tissue cells. As a result, high blood sugar (hyperglycemia) develops.  If blood glucose levels are kept in the normal range both before and during pregnancy, women can have a healthy pregnancy. If your blood glucose levels are not well controlled, there may be risks to you, your unborn baby, and your labor and delivery. Also, there may be risks to your baby once he or she is born.  RISK FACTORS  You are predisposed to developing type 1 diabetes if someone in your family has diabetes and you are exposed to certain environmental triggers.  You have an increased chance of developing type 2 diabetes if you have a family history of diabetes and also have one or more of the following risk factors:  Being overweight.  Having an inactive lifestyle.  Having a history of consistently eating high-calorie foods. SYMPTOMS 1. Increased thirst (polydipsia). 2. Increased urination (polyuria). 3. Increased urination during the night (nocturia). 4. Weight loss. This weight loss may be rapid. 5. Frequent, recurring infections. 6. Tiredness (fatigue). 7. Weakness. 8. Vision changes, such as blurred vision. 9. Fruity smell to your breath. 10. Abdominal pain. 11. Nausea or vomiting. DIAGNOSIS  If you have risk factors for  diabetes, you may be screened for undiagnosed type 2 diabetes at your first prenatal visit. If you have previously given birth and you had gestational diabetes, you should be screened. The screening should be performed 6-12 weeks after the child is born and repeated every 1-3 years after the first test. Diabetes is diagnosed when blood glucose levels are increased. Your blood glucose level may be checked by one or more of the following blood tests:  A fasting blood glucose test. You will not be allowed to eat for at least 8 hours before a blood sample is taken.  A random blood glucose test. Your blood glucose is checked at any time of the day regardless of when you ate.  A hemoglobin A1c blood glucose test. A hemoglobin A1c test provides information about blood glucose control over the previous 3 months.  An oral glucose tolerance test (OGTT). Your blood glucose is measured after you have not eaten (fasted) for 1-3 hours and then after you drink a glucose-containing beverage. An OGTT is usually performed during weeks 24-28 of your pregnancy. TREATMENT  1. You will need to take diabetes medicine or insulin daily to keep blood glucose levels in the desired range. 2. You will need to match insulin dosing with exercise and healthy food choices. If you have type 1 or type 2 diabetes, your treatment goal is to maintain the following blood glucose levels during pregnancy:  Before meals (preprandial), at bedtime, and overnight: 60-99 mg/dL.  After meals (postprandial): peak of 100-129 mg/dL.  A1c: less than 7%. HOME CARE INSTRUCTIONS   Have your hemoglobin  A1c level checked twice a year.  Perform daily blood glucose monitoring as directed by your health care provider. It is common to perform frequent blood glucose monitoring.  Monitor urine ketones when you are sick and as directed by your health care provider.  Take your diabetes medicine and insulin as directed by your health care provider  to maintain your blood glucose level in the desired range.  Never run out of diabetes medicine or insulin. It is needed every day.  Adjust insulin based on your intake of carbohydrates. Carbohydrates can raise blood glucose levels but need to be included in your diet. Carbohydrates provide vitamins, minerals, and fiber, which are an essential part of a healthy diet. Carbohydrates are found in fruits, vegetables, whole grains, dairy products, legumes, and foods containing added sugars.  Eat healthy foods. Alternate 3 meals with 3 snacks.  Maintain a healthy weight gain. The usual total expected weight gain varies according to your prepregnancy body mass index (BMI).  Carry a medical alert card or wear medical alert jewelry.  Carry a 15-gram carbohydrate snack with you at all times to treat low blood sugar (hypoglycemia). Some examples of 15-gram carbohydrate snacks include:  Glucose tablets, 3 or 4.  Glucose gel, 15-gram tube.  Raisins, 2 Tbsp (24 grams).  Jelly beans, 6.  Animal crackers, 8.  Fruit juice, regular soda, or low-fat milk, 4 ounces (120 mL).  Gummy treats, 9.  Recognize hypoglycemia. Hypoglycemia during pregnancy occurs with blood glucose levels of 60 mg/dL and below. The risk for hypoglycemia increases when fasting or skipping meals, during or after intense exercise, and during sleep. Hypoglycemia symptoms can include:  Tremors or shakes.  Decreased ability to concentrate.  Sweating.  Increased heart rate.  Headache.  Dry mouth.  Hunger.  Irritability.  Anxiety.  Restless sleep.  Altered speech or coordination.  Confusion.  Treat hypoglycemia promptly. If you are alert and able to safely swallow, follow the 15:15 rule:  Take 15-20 grams of rapid-acting glucose or carbohydrate. Rapid-acting options include glucose gel, glucose tablets, or 4 ounces (120 mL) of fruit juice, regular soda, or low-fat milk.  Check your blood glucose level 15 minutes  after taking the glucose.  Take an additional 15-20 grams of glucose if the repeat blood glucose level is still 70 mg/dL or below.  Eat a meal or snack within 1 hour once blood glucose levels return to normal.  Engage in at least 30 minutes of physical activity a day or as directed by your health care provider. Ten minutes of physical activity timed 30 minutes after each meal is encouraged to control postprandial blood glucose levels.  Watch for polyuria (excess urination) and polydipsia (feeling extra thirsty), which are early signs of hyperglycemia. An early awareness of hyperglycemia allows for prompt treatment. Treat hyperglycemia as directed by your health care provider.  Adjust your insulin dosing and food intake, as needed, if you start a new exercise or sport.  Follow your sick-day plan any time you are unable to eat or drink as usual.  Avoid tobacco and alcohol use.  Keep all follow-up visits as directed by your health care provider.  Follow the advice of your health care provider regarding your prenatal and post-delivery (postpartum) appointments, meal planning, exercise, medicines, vitamins, blood tests, other medical tests, and physical activities.  Continue daily skin and foot care. Examine your skin and feet daily for cuts, bruises, redness, nail problems, bleeding, blisters, or sores. A foot exam by a health care provider should  be done annually.  Brush your teeth and gums at least twice a day and floss at least once a day. Follow up with your dentist regularly.  Schedule an eye exam during the first trimester of your pregnancy or as directed by your health care provider.  Share your diabetes management plan with your workplace or school.  Stay up-to-date with immunizations.  Learn to manage stress.  Obtain ongoing diabetes education and support as needed.  Your health care provider may recommend that you take one low-dose aspirin (81 mg) each day to help prevent high  blood pressure during your pregnancy (preeclampsia or eclampsia). You may be at risk for preeclampsia or eclampsia if:  You had preeclampsia or eclampsia during a previous pregnancy.  Your baby did not grow as expected during a previous pregnancy.  You experienced preterm birth with a previous pregnancy.  You experienced a separation of the placenta from the uterus (placental abruption) during a previous pregnancy.  You experienced the loss of your baby during a previous pregnancy.  You are pregnant with more than one baby.  You have other medical conditions, such as high blood pressure or autoimmune disease. SEEK MEDICAL CARE IF:   You are unable to eat food or drink fluids for more than 6 hours.  You have nausea and vomiting for more than 6 hours.  You have a blood glucose level of 200 mg/dL and you have ketones in your urine.  There is a change in mental status.  You develop vision problems.  You have a persistent headache.  You have upper abdominal pain or discomfort.  You have an additional serious sickness.  You have diarrhea for more than 6 hours.  You have been sick or have had a fever for 2 days and are not getting better. SEEK IMMEDIATE MEDICAL CARE IF:  You have difficulty breathing.  You no longer feel your baby moving.  You are bleeding or have discharge from your vagina.  You start having premature contractions or labor. MAKE SURE YOU:  Understand these instructions.  Will watch your condition.  Will get help right away if you are not doing well or get worse.   This information is not intended to replace advice given to you by your health care provider. Make sure you discuss any questions you have with your health care provider.   Document Released: 12/23/2011 Document Revised: 04/20/2014 Document Reviewed: 12/23/2011 Elsevier Interactive Patient Education [employer] First Trimester of Pregnancy The first trimester of pregnancy is from  week 1 until the end of week 12 (months 1 through 3). A week after a sperm fertilizes an egg, the egg will implant on the wall of the uterus. This embryo will begin to develop into a baby. Genes from you and your partner are forming the baby. The female genes determine whether the baby is a boy or a girl. At 6-8 weeks, the eyes and face are formed, and the heartbeat can be seen on ultrasound. At the end of 12 weeks, all the baby's organs are formed.  Now that you are pregnant, you will want to do everything you can to have a healthy baby. Two of the most important things are to get good prenatal care and to follow your health care provider's instructions. Prenatal care is all the medical care you receive before the baby's birth. This care will help prevent, find, and treat any problems during the pregnancy and childbirth. BODY CHANGES Your body goes through many changes during  pregnancy. The changes vary from woman to woman.   You may gain or lose a couple of pounds at first.  You may feel sick to your stomach (nauseous) and throw up (vomit). If the vomiting is uncontrollable, call your health care provider.  You may tire easily.  You may develop headaches that can be relieved by medicines approved by your health care provider.  You may urinate more often. Painful urination may mean you have a bladder infection.  You may develop heartburn as a result of your pregnancy.  You may develop constipation because certain hormones are causing the muscles that push waste through your intestines to slow down.  You may develop hemorrhoids or swollen, bulging veins (varicose veins).  Your breasts may begin to grow larger and become tender. Your nipples may stick out more, and the tissue that surrounds them (areola) may become darker.  Your gums may bleed and may be sensitive to brushing and flossing.  Dark spots or blotches (chloasma, mask of pregnancy) may develop on your face. This will likely fade  after the baby is born.  Your menstrual periods will stop.  You may have a loss of appetite.  You may develop cravings for certain kinds of food.  You may have changes in your emotions from day to day, such as being excited to be pregnant or being concerned that something may go wrong with the pregnancy and baby.  You may have more vivid and strange dreams.  You may have changes in your hair. These can include thickening of your hair, rapid growth, and changes in texture. Some women also have hair loss during or after pregnancy, or hair that feels dry or thin. Your hair will most likely return to normal after your baby is born. WHAT TO EXPECT AT YOUR PRENATAL VISITS During a routine prenatal visit: 12. You will be weighed to make sure you and the baby are growing normally. 13. Your blood pressure will be taken. 14. Your abdomen will be measured to track your baby's growth. 15. The fetal heartbeat will be listened to starting around week 10 or 12 of your pregnancy. 16. Test results from any previous visits will be discussed. Your health care provider may ask you:  How you are feeling.  If you are feeling the baby move.  If you have had any abnormal symptoms, such as leaking fluid, bleeding, severe headaches, or abdominal cramping.  If you are using any tobacco products, including cigarettes, chewing tobacco, and electronic cigarettes.  If you have any questions. Other tests that may be performed during your first trimester include: 3. Blood tests to find your blood type and to check for the presence of any previous infections. They will also be used to check for low iron levels (anemia) and Rh antibodies. Later in the pregnancy, blood tests for diabetes will be done along with other tests if problems develop. 4. Urine tests to check for infections, diabetes, or protein in the urine. 5. An ultrasound to confirm the proper growth and development of the baby. 6. An amniocentesis to  check for possible genetic problems. 7. Fetal screens for spina bifida and Down syndrome. 8. You may need other tests to make sure you and the baby are doing well. 9. HIV (human immunodeficiency virus) testing. Routine prenatal testing includes screening for HIV, unless you choose not to have this test. HOME CARE INSTRUCTIONS  Medicines  Follow your health care provider's instructions regarding medicine use. Specific medicines may be either safe  or unsafe to take during pregnancy.  Take your prenatal vitamins as directed.  If you develop constipation, try taking a stool softener if your health care provider approves. Diet  Eat regular, well-balanced meals. Choose a variety of foods, such as meat or vegetable-based protein, fish, milk and low-fat dairy products, vegetables, fruits, and whole grain breads and cereals. Your health care provider will help you determine the amount of weight gain that is right for you.  Avoid raw meat and uncooked cheese. These carry germs that can cause birth defects in the baby.  Eating four or five small meals rather than three large meals a day may help relieve nausea and vomiting. If you start to feel nauseous, eating a few soda crackers can be helpful. Drinking liquids between meals instead of during meals also seems to help nausea and vomiting.  If you develop constipation, eat more high-fiber foods, such as fresh vegetables or fruit and whole grains. Drink enough fluids to keep your urine clear or pale yellow. Activity and Exercise  Exercise only as directed by your health care provider. Exercising will help you:  Control your weight.  Stay in shape.  Be prepared for labor and delivery.  Experiencing pain or cramping in the lower abdomen or low back is a good sign that you should stop exercising. Check with your health care provider before continuing normal exercises.  Try to avoid standing for long periods of time. Move your legs often if you must  stand in one place for a long time.  Avoid heavy lifting.  Wear low-heeled shoes, and practice good posture.  You may continue to have sex unless your health care provider directs you otherwise. Relief of Pain or Discomfort  Wear a good support bra for breast tenderness.   Take warm sitz baths to soothe any pain or discomfort caused by hemorrhoids. Use hemorrhoid cream if your health care provider approves.   Rest with your legs elevated if you have leg cramps or low back pain.  If you develop varicose veins in your legs, wear support hose. Elevate your feet for 15 minutes, 3-4 times a day. Limit salt in your diet. Prenatal Care  Schedule your prenatal visits by the twelfth week of pregnancy. They are usually scheduled monthly at first, then more often in the last 2 months before delivery.  Write down your questions. Take them to your prenatal visits.  Keep all your prenatal visits as directed by your health care provider. Safety  Wear your seat belt at all times when driving.  Make a list of emergency phone numbers, including numbers for family, friends, the hospital, and police and fire departments. General Tips  Ask your health care provider for a referral to a local prenatal education class. Begin classes no later than at the beginning of month 6 of your pregnancy.  Ask for help if you have counseling or nutritional needs during pregnancy. Your health care provider can offer advice or refer you to specialists for help with various needs.  Do not use hot tubs, steam rooms, or saunas.  Do not douche or use tampons or scented sanitary pads.  Do not cross your legs for long periods of time.  Avoid cat litter boxes and soil used by cats. These carry germs that can cause birth defects in the baby and possibly loss of the fetus by miscarriage or stillbirth.  Avoid all smoking, herbs, alcohol, and medicines not prescribed by your health care provider. Chemicals in these affect  the formation and growth of the baby.  Do not use any tobacco products, including cigarettes, chewing tobacco, and electronic cigarettes. If you need help quitting, ask your health care provider. You may receive counseling support and other resources to help you quit.  Schedule a dentist appointment. At home, brush your teeth with a soft toothbrush and be gentle when you floss. SEEK MEDICAL CARE IF:   You have dizziness.  You have mild pelvic cramps, pelvic pressure, or nagging pain in the abdominal area.  You have persistent nausea, vomiting, or diarrhea.  You have a bad smelling vaginal discharge.  You have pain with urination.  You notice increased swelling in your face, hands, legs, or ankles. SEEK IMMEDIATE MEDICAL CARE IF:   You have a fever.  You are leaking fluid from your vagina.  You have spotting or bleeding from your vagina.  You have severe abdominal cramping or pain.  You have rapid weight gain or loss.  You vomit blood or material that looks like coffee grounds.  You are exposed to [place] measles and have never had them.  You are exposed to fifth disease or chickenpox.  You develop a severe headache.  You have shortness of breath.  You have any kind of trauma, such as from a fall or a car accident.   This information is not intended to replace advice given to you by your health care provider. Make sure you discuss any questions you have with your health care provider.   Document Released: 03/24/2001 Document Revised: 04/20/2014 Document Reviewed: 02/07/2013 Elsevier Interactive Patient Education 2016 [employer].  Safe Medications in Pregnancy   Acne: Benzoyl Peroxide Salicylic Acid  Backache/Headache: Tylenol: 2 regular strength every 4 hours OR              2 Extra strength every 6 hours  Colds/Coughs/Allergies: Benadryl (alcohol free) 25 mg every 6 hours as needed Breath right strips Claritin Cepacol throat lozenges Chloraseptic throat  spray Cold-Eeze- up to three times per day Cough drops, alcohol free Flonase (by prescription only) Guaifenesin Mucinex Robitussin DM (plain only, alcohol free) Saline nasal spray/drops Sudafed (pseudoephedrine) & Actifed ** use only after [redacted] weeks gestation and if you do not have high blood pressure Tylenol Vicks Vaporub Zinc lozenges Zyrtec   Constipation: Colace Ducolax suppositories Fleet enema Glycerin suppositories Metamucil Milk of magnesia Miralax Senokot Smooth move tea  Diarrhea: Kaopectate Imodium A-D  *NO pepto Bismol  Hemorrhoids: Anusol Anusol HC Preparation H Tucks  Indigestion: Tums Maalox Mylanta Zantac  Pepcid  Insomnia: Benadryl (alcohol free) 25mg  every 6 hours as needed Tylenol PM Unisom, no Gelcaps  Leg Cramps: Tums MagGel  Nausea/Vomiting:  Bonine Dramamine Emetrol Ginger extract Sea bands Meclizine  Nausea medication to take during pregnancy:  Unisom (doxylamine succinate 25 mg tablets) Take one tablet daily at bedtime. If symptoms are not adequately controlled, the dose can be increased to a maximum recommended dose of two tablets daily (1/2 tablet in the morning, 1/2 tablet mid-afternoon and one at bedtime). Vitamin B6 100mg  tablets. Take one tablet twice a day (up to 200 mg per day).  Skin Rashes: Aveeno products Benadryl cream or 25mg  every 6 hours as needed Calamine Lotion 1% cortisone cream  Yeast infection: Gyne-lotrimin 7 Monistat 7   **If taking multiple medications, please check labels to avoid duplicating the same active ingredients **take medication as directed on the label ** Do not exceed 4000 mg of tylenol in 24 hours **Do not take medications that contain  aspirin or ibuprofen

## 2015-04-15 NOTE — Progress Notes (Signed)
Subjective:  Andrea [name] is a 25 y.o. G1P0000 at 3716w1d being seen today for ongoing prenatal care.  She is currently monitored for the following issues for this high-risk pregnancy and has Diabetes mellitus type 2, uncontrolled, without complications (HCC); Preexisting diabetes complicating pregnancy, antepartum; and Supervision of high risk pregnancy, antepartum on her problem list.  Patient reports no complaints.  Contractions: Not present. Vag. Bleeding: None.   . Denies leaking of fluid.   The following portions of the patient's history were reviewed and updated as appropriate: allergies, current medications, past family history, past medical history, past social history, past surgical history and problem list. Problem list updated.  Objective:   Filed Vitals:   04/15/15 0911  BP: 121/67  Pulse: 90  Weight: 238 lb (107.956 kg)    Fetal Status: Fetal Heart Rate (bpm): 120         General:  Alert, oriented and cooperative. Patient is in no acute distress.  Skin: Skin is warm and dry. No rash noted.   Cardiovascular: Normal heart rate noted  Respiratory: Normal respiratory effort, no problems with respiration noted  Abdomen: Soft, gravid, appropriate for gestational age. Pain/Pressure: Absent     Pelvic: Vag. Bleeding: None     Cervical exam deferred        Extremities: Normal range of motion.  Edema: None  Mental Status: Normal mood and affect. Normal behavior. Normal judgment and thought content.   Urinalysis: Urine Protein: Negative Urine Glucose: Negative  Currently taking Lantus 50u, Novolog 10-20u (typically 20) Fasting - 100- 120 2hr typically 130s  Assessment and Plan:  Pregnancy: G1P0000 at 6116w1d  1. Diabetes mellitus in pregnancy, antepartum, first trimester - Increase Lantus 54u nightly, consider splitting to BID   - Increased Novolog to 24u TID - Baseline labs today - 24 hr urine collection given  2. Preexisting diabetes complicating pregnancy, antepartum,  first trimester  3. Supervision of high risk pregnancy, antepartum, first trimester - added box  - Cytology - PAP [facility]lam[facility] - Ordered FIRST screening - Anatomy Scan in 4-6 weeks  Preterm labor symptoms and general obstetric precautions including but not limited to vaginal bleeding, contractions, leaking of fluid and fetal movement were reviewed in detail with the patient. Please refer to After Visit Summary for other counseling recommendations.  Return in about 1 week (around 04/22/2015) for DM education.  Future Appointments Date Time Provider Department Center  04/17/2015 8:00 AM [facility] US 1 WH-US 203  04/17/2015 9:00 AM [facility] LAB [facility] MFC-US  04/22/2015 9:45 AM [facility] NURSE [facility]OC  04/29/2015 10:45 AM Lesly DukesKelly H Leggett, MD [facility] WOC  05/29/2015 10:15 AM [facility] US 3 [facility]US 203     Federico FlakeKimberly Niles [place], MD

## 2015-04-16 DIAGNOSIS — O99210 Obesity complicating pregnancy, unspecified trimester: Secondary | ICD-10-CM | POA: Insufficient documentation

## 2015-04-17 ENCOUNTER — Other Ambulatory Visit: Payer: BLUE CROSS/BLUE SHIELD

## 2015-04-17 ENCOUNTER — Ambulatory Visit (HOSPITAL_COMMUNITY)
Admission: RE | Admit: 2015-04-17 | Discharge: 2015-04-17 | Disposition: A | Discharge status: Home / Self Care | Payer: BLUE CROSS/BLUE SHIELD | Source: Ambulatory Visit | Attending: Obstetrics and Gynecology | Admitting: Obstetrics and Gynecology

## 2015-04-17 VITALS — BP 100/65 | HR 114 | Wt 235.8 lb

## 2015-04-17 DIAGNOSIS — Z36 Encounter for antenatal screening of mother: Secondary | ICD-10-CM | POA: Insufficient documentation

## 2015-04-17 DIAGNOSIS — O99211 Obesity complicating pregnancy, first trimester: Secondary | ICD-10-CM | POA: Diagnosis present

## 2015-04-17 DIAGNOSIS — O0991 Supervision of high risk pregnancy, unspecified, first trimester: Secondary | ICD-10-CM

## 2015-04-17 DIAGNOSIS — O24111 Pre-existing diabetes mellitus, type 2, in pregnancy, first trimester: Secondary | ICD-10-CM | POA: Diagnosis not present

## 2015-04-17 DIAGNOSIS — Z3A12 12 weeks gestation of pregnancy: Secondary | ICD-10-CM | POA: Diagnosis not present

## 2015-04-17 LAB — COMPREHENSIVE METABOLIC PANEL
ALBUMIN: 3.9 g/dL (ref 3.6–5.1)
ALK PHOS: 52 U/L (ref 33–115)
ALT: 12 U/L (ref 6–29)
AST: 14 U/L (ref 10–30)
BUN: 6 mg/dL — AB (ref 7–25)
CALCIUM: 9.3 mg/dL (ref 8.6–10.2)
CO2: 24 mmol/L (ref 20–31)
Chloride: 100 mmol/L (ref 98–110)
Creat: 0.62 mg/dL (ref 0.50–1.10)
Glucose, Bld: 119 mg/dL — ABNORMAL HIGH (ref 65–99)
POTASSIUM: 4.2 mmol/L (ref 3.5–5.3)
Sodium: 135 mmol/L (ref 135–146)
TOTAL PROTEIN: 7 g/dL (ref 6.1–8.1)
Total Bilirubin: 0.3 mg/dL (ref 0.2–1.2)

## 2015-04-17 LAB — GC/CHLAMYDIA PROBE AMP (~~LOC~~) NOT AT ARMC
Chlamydia: NEGATIVE
Neisseria Gonorrhea: NEGATIVE

## 2015-04-17 LAB — CYTOLOGY - PAP

## 2015-04-17 LAB — TSH: TSH: 0.749 u[IU]/mL (ref 0.350–4.500)

## 2015-04-18 LAB — HEMOGLOBIN A1C
HEMOGLOBIN A1C: 7.7 % — AB (ref <5.7)
Mean Plasma Glucose: 174 mg/dL — ABNORMAL HIGH (ref <117)

## 2015-04-18 LAB — PROTEIN, URINE, 24 HOUR
PROTEIN 24H UR: 84 mg/(24.h) (ref <150)
PROTEIN, URINE: 5 mg/dL (ref 5–24)

## 2015-04-22 ENCOUNTER — Ambulatory Visit: Payer: BC Managed Care – PPO

## 2015-04-22 ENCOUNTER — Telehealth: Payer: Self-pay | Admitting: *Deleted

## 2015-04-23 ENCOUNTER — Other Ambulatory Visit (HOSPITAL_COMMUNITY): Payer: Self-pay

## 2015-04-29 ENCOUNTER — Ambulatory Visit (INDEPENDENT_AMBULATORY_CARE_PROVIDER_SITE_OTHER): Payer: BLUE CROSS/BLUE SHIELD | Admitting: Obstetrics & Gynecology

## 2015-04-29 VITALS — BP 118/83 | HR 115 | Temp 98.1°F | Wt 232.7 lb

## 2015-04-29 DIAGNOSIS — O2342 Unspecified infection of urinary tract in pregnancy, second trimester: Secondary | ICD-10-CM

## 2015-04-29 DIAGNOSIS — Z23 Encounter for immunization: Secondary | ICD-10-CM

## 2015-04-29 DIAGNOSIS — O0992 Supervision of high risk pregnancy, unspecified, second trimester: Secondary | ICD-10-CM

## 2015-04-29 LAB — POCT URINALYSIS DIP (DEVICE)
Bilirubin Urine: NEGATIVE
GLUCOSE, UA: NEGATIVE mg/dL
Hgb urine dipstick: NEGATIVE
Ketones, ur: NEGATIVE mg/dL
LEUKOCYTES UA: NEGATIVE
NITRITE: POSITIVE — AB
Protein, ur: NEGATIVE mg/dL
SPECIFIC GRAVITY, URINE: 1.02 (ref 1.005–1.030)
UROBILINOGEN UA: 0.2 mg/dL (ref 0.0–1.0)
pH: 7 (ref 5.0–8.0)

## 2015-04-29 LAB — GLUCOSE, CAPILLARY: GLUCOSE-CAPILLARY: 68 mg/dL (ref 65–99)

## 2015-04-29 NOTE — Progress Notes (Signed)
Pt informed me that her sugar was 29 according to her meter in the mean time I gave her coke and crackers. Then proceeded to take  her glucose here with the hospital meter and it read 68.

## 2015-04-29 NOTE — Progress Notes (Signed)
Urinalysis shows positive nitrites, sent for culture.

## 2015-04-29 NOTE — Progress Notes (Signed)
Subjective:  Andrea [name] is a 25 y.o. G1P0000 at 1912w1d being seen today for ongoing prenatal care.  She is currently monitored for the following issues for this high-risk pregnancy and has Diabetes mellitus type 2, uncontrolled, without complications (HCC); Preexisting diabetes complicating pregnancy, antepartum; Supervision of high risk pregnancy, antepartum; and Obesity affecting pregnancy, antepartum on her problem list.  Patient reports no complaints.  Contractions: Not present. Vag. Bleeding: None.  Movement: Present. Denies leaking of fluid.   The following portions of the patient's history were reviewed and updated as appropriate: allergies, current medications, past family history, past medical history, past social history, past surgical history and problem list. Problem list updated.  Objective:   Filed Vitals:   04/29/15 1118  BP: 118/83  Pulse: 115  Temp: 98.1 F (36.7 C)  Weight: 232 lb 11.2 oz (105.552 kg)    Fetal Status: Fetal Heart Rate (bpm): 145   Movement: Present     General:  Alert, oriented and cooperative. Patient is in no acute distress.  Skin: Skin is warm and dry. No rash noted.   Cardiovascular: Normal heart rate noted  Respiratory: Normal respiratory effort, no problems with respiration noted  Abdomen: Soft, gravid, appropriate for gestational age. Pain/Pressure: Absent     Pelvic: Vag. Bleeding: None     Cervical exam deferred        Extremities: Normal range of motion.  Edema: None  Mental Status: Normal mood and affect. Normal behavior. Normal judgment and thought content.   Urinalysis: Urine Protein: Negative Urine Glucose: Negative  Assessment and Plan:  Pregnancy: G1P0000 at 5512w1d  1. Supervision of high risk pregnancy, antepartum, second trimester Flu shot US scheduled Feb 15 AFP next visit  2.  Type 2 DM Meter appears to be reading low.  Our meter = 68 and her meter is 29 then 30.  Pt feels well.   Many of her readings are low the  past week so unsure of glycemic control given meter malfuntion.  Will have patient meet with diabetic educator and determine what to do with meter. Harriett Sineancy to call on Thursday Continue current regimen. RTC 1 week. - Culture, OB Urine  Preterm labor symptoms and general obstetric precautions including but not limited to vaginal bleeding, contractions, leaking of fluid and fetal movement were reviewed in detail with the patient. Please refer to After Visit Summary for other counseling recommendations.  Return in about 1 week (around 05/06/2015).   Lesly DukesKelly H Teniya Filter, MD

## 2015-05-01 LAB — CULTURE, OB URINE
COLONY COUNT: NO GROWTH
Organism ID, Bacteria: NO GROWTH

## 2015-05-06 ENCOUNTER — Ambulatory Visit (INDEPENDENT_AMBULATORY_CARE_PROVIDER_SITE_OTHER): Payer: BLUE CROSS/BLUE SHIELD | Admitting: Family Medicine

## 2015-05-06 ENCOUNTER — Encounter: Payer: Self-pay | Admitting: Family Medicine

## 2015-05-06 VITALS — BP 133/64 | HR 89 | Wt 243.7 lb

## 2015-05-06 DIAGNOSIS — O24912 Unspecified diabetes mellitus in pregnancy, second trimester: Secondary | ICD-10-CM

## 2015-05-06 DIAGNOSIS — O99212 Obesity complicating pregnancy, second trimester: Secondary | ICD-10-CM

## 2015-05-06 DIAGNOSIS — O0992 Supervision of high risk pregnancy, unspecified, second trimester: Secondary | ICD-10-CM

## 2015-05-06 DIAGNOSIS — E669 Obesity, unspecified: Secondary | ICD-10-CM

## 2015-05-06 DIAGNOSIS — O24312 Unspecified pre-existing diabetes mellitus in pregnancy, second trimester: Secondary | ICD-10-CM

## 2015-05-06 LAB — POCT URINALYSIS DIP (DEVICE)
Bilirubin Urine: NEGATIVE
Glucose, UA: NEGATIVE mg/dL
HGB URINE DIPSTICK: NEGATIVE
Ketones, ur: NEGATIVE mg/dL
Leukocytes, UA: NEGATIVE
Nitrite: NEGATIVE
Protein, ur: NEGATIVE mg/dL
SPECIFIC GRAVITY, URINE: 1.015 (ref 1.005–1.030)
Urobilinogen, UA: 0.2 mg/dL (ref 0.0–1.0)
pH: 6 (ref 5.0–8.0)

## 2015-05-06 NOTE — Patient Instructions (Signed)
[employer]- for leaking breasts   Safe Medications in Pregnancy   Acne: Benzoyl Peroxide Salicylic Acid  Backache/Headache: Tylenol: 2 regular strength every 4 hours OR              2 Extra strength every 6 hours  Colds/Coughs/Allergies: Benadryl (alcohol free) 25 mg every 6 hours as needed Breath right strips Claritin Cepacol throat lozenges Chloraseptic throat spray Cold-Eeze- up to three times per day Cough drops, alcohol free Flonase (by prescription only) Guaifenesin Mucinex Robitussin DM (plain only, alcohol free) Saline nasal spray/drops Sudafed (pseudoephedrine) & Actifed ** use only after [redacted] weeks gestation and if you do not have high blood pressure Tylenol Vicks Vaporub Zinc lozenges Zyrtec   Constipation: Colace Ducolax suppositories Fleet enema Glycerin suppositories Metamucil Milk of magnesia Miralax Senokot Smooth move tea  Diarrhea: Kaopectate Imodium A-D  *NO pepto Bismol  Hemorrhoids: Anusol Anusol HC Preparation H Tucks  Indigestion: Tums Maalox Mylanta Zantac  Pepcid  Insomnia: Benadryl (alcohol free)  every 6 hours as needed Tylenol PM Unisom, no Gelcaps  Leg Cramps: Tums MagGel  Nausea/Vomiting:  Bonine Dramamine Emetrol Ginger extract Sea bands Meclizine  Nausea medication to take during pregnancy:  Unisom (doxylamine succinate 25 mg tablets) Take one tablet daily at bedtime. If symptoms are not adequately controlled, the dose can be increased to a maximum recommended dose of two tablets daily (1/2 tablet in the morning, 1/2 tablet mid-afternoon and one at bedtime). Vitamin B6  tablets. Take one tablet twice a day (up to 200 mg per day).  Skin Rashes: Aveeno products Benadryl cream or  every 6 hours as needed Calamine Lotion 1% cortisone cream  Yeast infection: Gyne-lotrimin 7 Monistat 7   **If taking multiple medications, please check labels to avoid duplicating the same active  ingredients **take medication as directed on the label ** Do not exceed 4000 mg of tylenol in 24 hours **Do not take medications that contain aspirin or ibuprofen

## 2015-05-06 NOTE — Progress Notes (Signed)
Discussed breast feeding tip of the week.  

## 2015-05-06 NOTE — Progress Notes (Signed)
Subjective:  Andrea [name] is a 25 y.o. G1P0000 at [redacted]w[redacted]d being seen today for ongoing prenatal care.  She is currently monitored for the following issues for this high-risk pregnancy and has Diabetes mellitus type 2, uncontrolled, without complications (HCC); Preexisting diabetes complicating pregnancy, antepartum; Supervision of high risk pregnancy, antepartum; and Obesity affecting pregnancy, antepartum on her problem list.  Patient reports no complaints.  Contractions: Not present. Vag. Bleeding: None.   . Denies leaking of fluid.   Recollection: Did not bring book Fasting: 50-60s Bfast: 80s, highest 120 and this was only once Lunch:  Highest 100 Dinner: around 90-100  The following portions of the patient's history were reviewed and updated as appropriate: allergies, current medications, past family history, past medical history, past social history, past surgical history and problem list. Problem list updated.  Objective:   Filed Vitals:   05/06/15 1129  BP: 133/64  Pulse: 89  Weight: 243 lb 11.2 oz (110.542 kg)    Fetal Status: Fetal Heart Rate (bpm): 142         General:  Alert, oriented and cooperative. Patient is in no acute distress.  Skin: Skin is warm and dry. No rash noted.   Cardiovascular: Normal heart rate noted  Respiratory: Normal respiratory effort, no problems with respiration noted  Abdomen: Soft, gravid, appropriate for gestational age. Pain/Pressure: Absent     Pelvic: Vag. Bleeding: None     Cervical exam deferred        Extremities: Normal range of motion.  Edema: None  Mental Status: Normal mood and affect. Normal behavior. Normal judgment and thought content.   Urinalysis:      Assessment and Plan:  Pregnancy: G1P0000 at [redacted]w[redacted]d  1. Obesity affecting pregnancy, antepartum, second trimester  2. Supervision of high risk pregnancy, antepartum, second trimester Updated box Extensive discussion about breastfeeding  3. Preexisting diabetes  complicating pregnancy, antepartum, second trimester - Lantus 60 QHS ONLY - Not taking Aspart- too expensive, lost insurance coverage - see overview for weight based dosing is she needs to start NPH as she is at the max dose of lantus  Preterm labor symptoms and general obstetric precautions including but not limited to vaginal bleeding, contractions, leaking of fluid and fetal movement were reviewed in detail with the patient. Please refer to After Visit Summary for other counseling recommendations.  Return in about 2 weeks (around 05/20/2015) for DM educator.  Future Appointments Date Time Provider Department Center  05/20/2015 9:00 AM Lelon Perla NURSE [facility]  05/29/2015 10:15 AM [facility] [place] 3 [facility]  06/03/2015 10:45 AM Federico Flake, MD [facility]    Federico Flake, MD

## 2015-05-20 ENCOUNTER — Ambulatory Visit: Payer: BLUE CROSS/BLUE SHIELD | Admitting: *Deleted

## 2015-05-20 ENCOUNTER — Encounter: Payer: Medicaid Other | Attending: Family Medicine | Admitting: *Deleted

## 2015-05-20 DIAGNOSIS — O24111 Pre-existing diabetes mellitus, type 2, in pregnancy, first trimester: Secondary | ICD-10-CM | POA: Diagnosis present

## 2015-05-20 DIAGNOSIS — Z713 Dietary counseling and surveillance: Secondary | ICD-10-CM | POA: Insufficient documentation

## 2015-05-20 DIAGNOSIS — O24419 Gestational diabetes mellitus in pregnancy, unspecified control: Secondary | ICD-10-CM

## 2015-05-20 NOTE — Progress Notes (Signed)
Patient presents for review of glucose readings. She did not bring her log book. She did bring an old meter with expired strips. She noted that she was having difficulty with her insurance as she is going through a divorce. I spoke with Vela Prose at [facility] and she encouraged Andrea [name] to come talk to her to see what she could figure out. I also provided her with the information to obtain [employer] meter and testing supplies at a nominal fee. Unable to evaluate current glucose readings. Will return in one week.

## 2015-05-29 ENCOUNTER — Other Ambulatory Visit: Payer: Self-pay | Admitting: Family Medicine

## 2015-05-29 ENCOUNTER — Ambulatory Visit (HOSPITAL_COMMUNITY)
Admission: RE | Admit: 2015-05-29 | Discharge: 2015-05-29 | Disposition: A | Discharge status: Home / Self Care | Payer: Medicaid Other | Source: Ambulatory Visit | Attending: Family Medicine | Admitting: Family Medicine

## 2015-05-29 DIAGNOSIS — O24911 Unspecified diabetes mellitus in pregnancy, first trimester: Secondary | ICD-10-CM

## 2015-05-29 DIAGNOSIS — Z3A18 18 weeks gestation of pregnancy: Secondary | ICD-10-CM | POA: Diagnosis not present

## 2015-05-29 DIAGNOSIS — O24111 Pre-existing diabetes mellitus, type 2, in pregnancy, first trimester: Secondary | ICD-10-CM

## 2015-05-29 DIAGNOSIS — O0991 Supervision of high risk pregnancy, unspecified, first trimester: Secondary | ICD-10-CM

## 2015-06-03 ENCOUNTER — Ambulatory Visit (INDEPENDENT_AMBULATORY_CARE_PROVIDER_SITE_OTHER): Payer: BLUE CROSS/BLUE SHIELD | Admitting: Family Medicine

## 2015-06-03 ENCOUNTER — Encounter: Payer: Self-pay | Admitting: Family Medicine

## 2015-06-03 VITALS — BP 114/65 | HR 102 | Temp 98.5°F | Wt 238.0 lb

## 2015-06-03 DIAGNOSIS — O99212 Obesity complicating pregnancy, second trimester: Secondary | ICD-10-CM

## 2015-06-03 DIAGNOSIS — O24312 Unspecified pre-existing diabetes mellitus in pregnancy, second trimester: Secondary | ICD-10-CM

## 2015-06-03 DIAGNOSIS — E669 Obesity, unspecified: Secondary | ICD-10-CM

## 2015-06-03 DIAGNOSIS — O24911 Unspecified diabetes mellitus in pregnancy, first trimester: Secondary | ICD-10-CM

## 2015-06-03 DIAGNOSIS — O0992 Supervision of high risk pregnancy, unspecified, second trimester: Secondary | ICD-10-CM

## 2015-06-03 DIAGNOSIS — O24912 Unspecified diabetes mellitus in pregnancy, second trimester: Secondary | ICD-10-CM

## 2015-06-03 LAB — POCT URINALYSIS DIP (DEVICE)
Bilirubin Urine: NEGATIVE
GLUCOSE, UA: NEGATIVE mg/dL
Hgb urine dipstick: NEGATIVE
Ketones, ur: NEGATIVE mg/dL
Nitrite: NEGATIVE
PH: 6 (ref 5.0–8.0)
PROTEIN: NEGATIVE mg/dL
SPECIFIC GRAVITY, URINE: 1.015 (ref 1.005–1.030)
UROBILINOGEN UA: 0.2 mg/dL (ref 0.0–1.0)

## 2015-06-03 MED ORDER — INSULIN GLARGINE 100 UNIT/ML ~~LOC~~ SOLN: SUBCUTANEOUS | Status: DC

## 2015-06-03 NOTE — Patient Instructions (Addendum)
Places to have your son circumcised:    [facility] [phone] $480 by 4 wks  [facility] [phone] $244 by 4 wks  [facility] [phone] $175 by 2 wks  [facility] [phone] $250 by 7 days [facility] [phone] $150 by 4 wks  These prices sometimes change but are roughly what you can expect to pay. Please call and confirm pricing.   Circumcision is considered an elective/non-medically necessary procedure. There are many reasons parents decide to have their sons circumsized. During the first year of life circumcised males have a reduced risk of urinary tract infections but after this year the rates between circumcised males and uncircumcised males are the same.  It is safe to have your son circumcised outside of the hospital and the places above perform them regularly.    Second Trimester of Pregnancy The second trimester is from week 13 through week 28, months 4 through 6. The second trimester is often a time when you feel your best. Your body has also adjusted to being pregnant, and you begin to feel better physically. Usually, morning sickness has lessened or quit completely, you may have more energy, and you may have an increase in appetite. The second trimester is also a time when the fetus is growing rapidly. At the end of the sixth month, the fetus is about 9 inches long and weighs about 1 pounds. You will likely begin to feel the baby move (quickening) between 18 and 20 weeks of the pregnancy. BODY CHANGES Your body goes through many changes during pregnancy. The changes vary from woman to woman.   Your weight will continue to increase. You will notice your lower abdomen bulging out.  You may begin to get stretch marks on your hips, abdomen, and breasts.  You may develop headaches that can be relieved by medicines  approved by your health care provider.  You may urinate more often because the fetus is pressing on your bladder.  You may develop or continue to have heartburn as a result of your pregnancy.  You may develop constipation because certain hormones are causing the muscles that push waste through your intestines to slow down.  You may develop hemorrhoids or swollen, bulging veins (varicose veins).  You may have back pain because of the weight gain and pregnancy hormones relaxing your joints between the bones in your pelvis and as a result of a shift in weight and the muscles that support your balance.  Your breasts will continue to grow and be tender.  Your gums may bleed and may be sensitive to brushing and flossing.  Dark spots or blotches (chloasma, mask of pregnancy) may develop on your face. This will likely fade after the baby is born.  A dark line from your belly button to the pubic area (linea nigra) may appear. This will likely fade after the baby is born.  You may have changes in your hair. These can include thickening of your hair, rapid growth, and changes in texture. Some women also have hair loss during or after pregnancy, or hair that feels dry or thin. Your hair will most likely return to normal after your baby is born. WHAT TO EXPECT AT YOUR PRENATAL VISITS During a routine prenatal visit:  You will be weighed to make sure you and the fetus are growing normally.  Your blood pressure will be taken.  Your abdomen will be measured to track your baby's growth.  The fetal heartbeat will be listened to.  Any test results from the previous  visit will be discussed. Your health care provider may ask you:  How you are feeling.  If you are feeling the baby move.  If you have had any abnormal symptoms, such as leaking fluid, bleeding, severe headaches, or abdominal cramping.  If you are using any tobacco products, including cigarettes, chewing tobacco, and electronic  cigarettes.  If you have any questions. Other tests that may be performed during your second trimester include:  Blood tests that check for:  Low iron levels (anemia).  Gestational diabetes (between 24 and 28 weeks).  Rh antibodies.  Urine tests to check for infections, diabetes, or protein in the urine.  An ultrasound to confirm the proper growth and development of the baby.  An amniocentesis to check for possible genetic problems.  Fetal screens for spina bifida and Down syndrome.  HIV (human immunodeficiency virus) testing. Routine prenatal testing includes screening for HIV, unless you choose not to have this test. HOME CARE INSTRUCTIONS   Avoid all smoking, herbs, alcohol, and unprescribed drugs. These chemicals affect the formation and growth of the baby.  Do not use any tobacco products, including cigarettes, chewing tobacco, and electronic cigarettes. If you need help quitting, ask your health care provider. You may receive counseling support and other resources to help you quit.  Follow your health care provider's instructions regarding medicine use. There are medicines that are either safe or unsafe to take during pregnancy.  Exercise only as directed by your health care provider. Experiencing uterine cramps is a good sign to stop exercising.  Continue to eat regular, healthy meals.  Wear a good support bra for breast tenderness.  Do not use hot tubs, steam rooms, or saunas.  Wear your seat belt at all times when driving.  Avoid raw meat, uncooked cheese, cat litter boxes, and soil used by cats. These carry germs that can cause birth defects in the baby.  Take your prenatal vitamins.  Take 1500-2000 mg of calcium daily starting at the 20th week of pregnancy until you deliver your baby.  Try taking a stool softener (if your health care provider approves) if you develop constipation. Eat more high-fiber foods, such as fresh vegetables or fruit and whole grains.  Drink plenty of fluids to keep your urine clear or pale yellow.  Take warm sitz baths to soothe any pain or discomfort caused by hemorrhoids. Use hemorrhoid cream if your health care provider approves.  If you develop varicose veins, wear support hose. Elevate your feet for 15 minutes, 3-4 times a day. Limit salt in your diet.  Avoid heavy lifting, wear low heel shoes, and practice good posture.  Rest with your legs elevated if you have leg cramps or low back pain.  Visit your dentist if you have not gone yet during your pregnancy. Use a soft toothbrush to brush your teeth and be gentle when you floss.  A sexual relationship may be continued unless your health care provider directs you otherwise.  Continue to go to all your prenatal visits as directed by your health care provider. SEEK MEDICAL CARE IF:   You have dizziness.  You have mild pelvic cramps, pelvic pressure, or nagging pain in the abdominal area.  You have persistent nausea, vomiting, or diarrhea.  You have a bad smelling vaginal discharge.  You have pain with urination. SEEK IMMEDIATE MEDICAL CARE IF:   You have a fever.  You are leaking fluid from your vagina.  You have spotting or bleeding from your vagina.  You have  severe abdominal cramping or pain.  You have rapid weight gain or loss.  You have shortness of breath with chest pain.  You notice sudden or extreme swelling of your face, hands, ankles, feet, or legs.  You have not felt your baby move in over an hour.  You have severe headaches that do not go away with medicine.  You have vision changes.   This information is not intended to replace advice given to you by your health care provider. Make sure you discuss any questions you have with your health care provider.   Document Released: 03/24/2001 Document Revised: 04/20/2014 Document Reviewed: 05/31/2012 Elsevier Interactive Patient Education [employer]

## 2015-06-03 NOTE — Progress Notes (Signed)
Subjective:  Andrea [name] is a 25 y.o. G1P0000 at [redacted]w[redacted]d being seen today for ongoing prenatal care.  She is currently monitored for the following issues for this high-risk pregnancy and has Diabetes mellitus type 2, uncontrolled, without complications (HCC); Preexisting diabetes complicating pregnancy, antepartum; Supervision of high risk pregnancy, antepartum; and Obesity affecting pregnancy, antepartum on her problem list.  Patient reports no complaints.  Contractions: Not present. Vag. Bleeding: None.  Movement: Present. Denies leaking of fluid.   The following portions of the patient's history were reviewed and updated as appropriate: allergies, current medications, past family history, past medical history, past social history, past surgical history and problem list. Problem list updated.  Objective:   Filed Vitals:   06/03/15 1105  BP: 114/65  Pulse: 102  Temp: 98.5 F (36.9 C)  Weight: 238 lb (107.956 kg)    Fetal Status: Fetal Heart Rate (bpm): 144 Fundal Height: 19 cm Movement: Present     General:  Alert, oriented and cooperative. Patient is in no acute distress.  Skin: Skin is warm and dry. No rash noted.   Cardiovascular: Normal heart rate noted  Respiratory: Normal respiratory effort, no problems with respiration noted  Abdomen: Soft, gravid, appropriate for gestational age. Pain/Pressure: Absent     Pelvic: Vag. Bleeding: None     Cervical exam deferred        Extremities: Normal range of motion.  Edema: Trace  Mental Status: Normal mood and affect. Normal behavior. Normal judgment and thought content.   Urinalysis: Urine Protein: Negative Urine Glucose: Negative  Does not have log today- this patient recall Fasting 90ish, nothing over 100 2hr  70s  Assessment and Plan:  Pregnancy: G1P0000 at [redacted]w[redacted]d  1. Preexisting diabetes complicating pregnancy, antepartum, second trimester - taking 60u lantus daily, not taking aspart 2/2 to cost. She is currently getting  medicaid- she was on her ex-husband's insurance and this prevented her from getting coverage until this point. Did not bring in her log today.  - [place] MFM OB FOLLOW UP; Future- follow up anatomy - AFP, Quad Screen - Fetal Echo ordered - optho referral placed  2. Obesity affecting pregnancy, antepartum, second trimester Discussed weight gain  3. Supervision of high risk pregnancy, antepartum, second trimester - Updated box - AFP, Quad Screen - Dental referral placed  Preterm labor symptoms and general obstetric precautions including but not limited to vaginal bleeding, contractions, leaking of fluid and fetal movement were reviewed in detail with the patient. Please refer to After Visit Summary for other counseling recommendations.  Return in about 2 weeks (around 06/17/2015) for DM education.  Future Appointments Date Time Provider Department Center  06/26/2015 11:00 AM [facility] [place] 1 [facility]  07/01/2015 10:05 AM Catalina Antigua, MD [facility]   Federico Flake, MD

## 2015-06-03 NOTE — Progress Notes (Signed)
Reviewed tip of week with patient  

## 2015-06-03 NOTE — Progress Notes (Signed)
U/S scheduled for 06/26/2015 :00AM for follow up anatomy Fetal echo Scheduled for 06/14/2015 :00AM Referral faxed to [facility]. Will call to schedule appointment. List of dental resources given to patient to make own appointment once she completes Medicaid application.

## 2015-06-07 LAB — AFP, QUAD SCREEN
AFP: 53.6 ng/mL
CURR GEST AGE: 19.5 wks.days
HCG TOTAL: 6.06 [IU]/mL
INH: 123.4 pg/mL
INTERPRETATION-AFP: NEGATIVE
MOM FOR AFP: 1.5
MOM FOR HCG: 0.36
MoM for INH: 0.88
OPEN SPINA BIFIDA: NEGATIVE
Osb Risk: 1:1590 {titer}
Tri 18 Scr Risk Est: NEGATIVE
Trisomy 18 (Edward) Syndrome Interp.: 1:3420 {titer}
UE3 MOM: 0.8
uE3 Value: 1.25 ng/mL

## 2015-06-17 ENCOUNTER — Ambulatory Visit: Payer: Self-pay

## 2015-06-24 ENCOUNTER — Ambulatory Visit: Payer: Self-pay

## 2015-06-26 ENCOUNTER — Other Ambulatory Visit: Payer: Self-pay | Admitting: General Practice

## 2015-06-26 ENCOUNTER — Encounter (HOSPITAL_COMMUNITY): Payer: Self-pay

## 2015-06-26 ENCOUNTER — Ambulatory Visit (HOSPITAL_COMMUNITY)
Admission: RE | Admit: 2015-06-26 | Discharge: 2015-06-26 | Disposition: A | Discharge status: Home / Self Care | Payer: Medicaid Other | Source: Ambulatory Visit | Attending: Family Medicine | Admitting: Family Medicine

## 2015-06-26 VITALS — BP 128/64 | HR 84 | Wt 246.6 lb

## 2015-06-26 DIAGNOSIS — O24312 Unspecified pre-existing diabetes mellitus in pregnancy, second trimester: Secondary | ICD-10-CM

## 2015-06-26 DIAGNOSIS — Z3A22 22 weeks gestation of pregnancy: Secondary | ICD-10-CM

## 2015-06-26 DIAGNOSIS — O99212 Obesity complicating pregnancy, second trimester: Secondary | ICD-10-CM

## 2015-06-26 DIAGNOSIS — Z36 Encounter for antenatal screening of mother: Secondary | ICD-10-CM | POA: Diagnosis not present

## 2015-06-26 DIAGNOSIS — O24112 Pre-existing diabetes mellitus, type 2, in pregnancy, second trimester: Secondary | ICD-10-CM | POA: Diagnosis not present

## 2015-06-26 DIAGNOSIS — Z0489 Encounter for examination and observation for other specified reasons: Secondary | ICD-10-CM

## 2015-06-26 DIAGNOSIS — IMO0002 Reserved for concepts with insufficient information to code with codable children: Secondary | ICD-10-CM

## 2015-06-26 DIAGNOSIS — O24319 Unspecified pre-existing diabetes mellitus in pregnancy, unspecified trimester: Secondary | ICD-10-CM

## 2015-07-01 ENCOUNTER — Encounter: Payer: Self-pay | Admitting: Obstetrics and Gynecology

## 2015-07-01 ENCOUNTER — Ambulatory Visit (INDEPENDENT_AMBULATORY_CARE_PROVIDER_SITE_OTHER): Payer: Self-pay | Admitting: Obstetrics and Gynecology

## 2015-07-01 VITALS — BP 126/88 | HR 102 | Wt 245.0 lb

## 2015-07-01 DIAGNOSIS — E1165 Type 2 diabetes mellitus with hyperglycemia: Secondary | ICD-10-CM

## 2015-07-01 DIAGNOSIS — O24312 Unspecified pre-existing diabetes mellitus in pregnancy, second trimester: Secondary | ICD-10-CM

## 2015-07-01 DIAGNOSIS — IMO0001 Reserved for inherently not codable concepts without codable children: Secondary | ICD-10-CM

## 2015-07-01 DIAGNOSIS — O24912 Unspecified diabetes mellitus in pregnancy, second trimester: Secondary | ICD-10-CM

## 2015-07-01 DIAGNOSIS — O99212 Obesity complicating pregnancy, second trimester: Secondary | ICD-10-CM

## 2015-07-01 DIAGNOSIS — O0992 Supervision of high risk pregnancy, unspecified, second trimester: Secondary | ICD-10-CM

## 2015-07-01 DIAGNOSIS — E669 Obesity, unspecified: Secondary | ICD-10-CM

## 2015-07-01 LAB — POCT URINALYSIS DIP (DEVICE)
Bilirubin Urine: NEGATIVE
GLUCOSE, UA: NEGATIVE mg/dL
Hgb urine dipstick: NEGATIVE
Ketones, ur: NEGATIVE mg/dL
Nitrite: NEGATIVE
PROTEIN: NEGATIVE mg/dL
Specific Gravity, Urine: 1.02 (ref 1.005–1.030)
UROBILINOGEN UA: 0.2 mg/dL (ref 0.0–1.0)
pH: 7 (ref 5.0–8.0)

## 2015-07-01 NOTE — Telephone Encounter (Signed)
Completed-  Patient did not come to appt today due to snow. I called to review glucose readings. FBS in the 90's and 2hpp in 80-90's per patient. Advised to call office this PM to reschedule appt.

## 2015-07-01 NOTE — Progress Notes (Signed)
Pt reports a lumpy area on her breast.

## 2015-07-01 NOTE — Progress Notes (Signed)
Subjective:  Andrea [name] is a 25 y.o. G1P0000 at 7441w1d being seen today for ongoing prenatal care.  She is currently monitored for the following issues for this high-risk pregnancy and has Diabetes mellitus type 2, uncontrolled, without complications (HCC); Preexisting diabetes complicating pregnancy, antepartum; Supervision of high risk pregnancy, antepartum; and Obesity affecting pregnancy, antepartum on her problem list.  Patient reports no complaints.  Contractions: Not present. Vag. Bleeding: None.  Movement: Present. Denies leaking of fluid.   The following portions of the patient's history were reviewed and updated as appropriate: allergies, current medications, past family history, past medical history, past social history, past surgical history and problem list. Problem list updated.  Objective:   Filed Vitals:   07/01/15 1015  BP: 126/88  Pulse: 102  Weight: 245 lb (111.131 kg)    Fetal Status: Fetal Heart Rate (bpm): 145   Movement: Present     General:  Alert, oriented and cooperative. Patient is in no acute distress.  Skin: Skin is warm and dry. No rash noted.   Cardiovascular: Normal heart rate noted  Respiratory: Normal respiratory effort, no problems with respiration noted  Abdomen: Soft, gravid, appropriate for gestational age. Pain/Pressure: Absent     Pelvic: Vag. Bleeding: None     Cervical exam deferred        Extremities: Normal range of motion.  Edema: Trace  Mental Status: Normal mood and affect. Normal behavior. Normal judgment and thought content.   Urinalysis: Urine Protein: Negative Urine Glucose: Negative  Assessment and Plan:  Pregnancy: G1P0000 at 9141w1d  1. Preexisting diabetes complicating pregnancy, antepartum, second trimester Patient did not bring log book but reports fasting in the 50's and pp 60-70 Continue current insulin regimen Normal fetal echo as per patient Follow up growth in April  2. Obesity affecting pregnancy, antepartum,  second trimester   3. Uncontrolled diabetes mellitus type 2 without complications, unspecified long term insulin use status (HCC)   4. Supervision of high risk pregnancy, antepartum, second trimester   Preterm labor symptoms and general obstetric precautions including but not limited to vaginal bleeding, contractions, leaking of fluid and fetal movement were reviewed in detail with the patient. Please refer to After Visit Summary for other counseling recommendations.  No Follow-up on file.   Catalina AntiguaPeggy Yesika Rispoli, MD

## 2015-07-22 ENCOUNTER — Encounter: Payer: Self-pay | Admitting: General Practice

## 2015-07-22 ENCOUNTER — Ambulatory Visit (INDEPENDENT_AMBULATORY_CARE_PROVIDER_SITE_OTHER): Payer: Self-pay | Admitting: Obstetrics and Gynecology

## 2015-07-22 VITALS — BP 126/86 | HR 90 | Temp 98.4°F

## 2015-07-22 DIAGNOSIS — O24319 Unspecified pre-existing diabetes mellitus in pregnancy, unspecified trimester: Secondary | ICD-10-CM

## 2015-07-22 DIAGNOSIS — O0992 Supervision of high risk pregnancy, unspecified, second trimester: Secondary | ICD-10-CM

## 2015-07-22 DIAGNOSIS — O24912 Unspecified diabetes mellitus in pregnancy, second trimester: Secondary | ICD-10-CM

## 2015-07-22 DIAGNOSIS — Z23 Encounter for immunization: Secondary | ICD-10-CM

## 2015-07-22 LAB — POCT URINALYSIS DIP (DEVICE)
Bilirubin Urine: NEGATIVE
GLUCOSE, UA: NEGATIVE mg/dL
HGB URINE DIPSTICK: NEGATIVE
Ketones, ur: NEGATIVE mg/dL
Leukocytes, UA: NEGATIVE
Nitrite: NEGATIVE
PH: 6 (ref 5.0–8.0)
PROTEIN: NEGATIVE mg/dL
SPECIFIC GRAVITY, URINE: 1.02 (ref 1.005–1.030)
UROBILINOGEN UA: 0.2 mg/dL (ref 0.0–1.0)

## 2015-07-22 LAB — CBC
HEMATOCRIT: 34.2 % — AB (ref 35.0–45.0)
HEMOGLOBIN: 11.2 g/dL — AB (ref 11.7–15.5)
MCH: 26.4 pg — AB (ref 27.0–33.0)
MCHC: 32.7 g/dL (ref 32.0–36.0)
MCV: 80.5 fL (ref 80.0–100.0)
MPV: 9.1 fL (ref 7.5–12.5)
Platelets: 331 10*3/uL (ref 140–400)
RBC: 4.25 MIL/uL (ref 3.80–5.10)
RDW: 16 % — ABNORMAL HIGH (ref 11.0–15.0)
WBC: 7.4 10*3/uL (ref 3.8–10.8)

## 2015-07-22 LAB — HEMOGLOBIN A1C
Hgb A1c MFr Bld: 5.8 % — ABNORMAL HIGH (ref <5.7)
Mean Plasma Glucose: 120 mg/dL

## 2015-07-22 LAB — HIV ANTIBODY (ROUTINE TESTING W REFLEX): HIV 1&2 Ab, 4th Generation: NONREACTIVE

## 2015-07-22 MED ORDER — TETANUS-DIPHTH-ACELL PERTUSSIS 5-2.5-18.5 LF-MCG/0.5 IM SUSP
0.5000 mL | Freq: Once | INTRAMUSCULAR | Status: AC
  Administered 2015-07-22: 0.5 mL via INTRAMUSCULAR

## 2015-07-22 NOTE — Progress Notes (Signed)
28 wk packet given  Tdap vaccine given  28 wk labs today

## 2015-07-22 NOTE — Progress Notes (Signed)
28 wk packet given  

## 2015-07-22 NOTE — Progress Notes (Signed)
Subjective:  Andrea [name] is a 25 y.o. G1P0000 at 4840w1d being seen today for ongoing prenatal care.  She is currently monitored for the following issues for this high-risk pregnancy and has Diabetes mellitus type 2, uncontrolled, without complications (HCC); Preexisting diabetes complicating pregnancy, antepartum; Supervision of high risk pregnancy, antepartum; and Obesity affecting pregnancy, antepartum on her problem list.  Patient reports. No log book again. fastings 80s. PPs 70s-100s, not checking regularly or exactly 2 hours after first bite of meal.  Contractions: Not present. Vag. Bleeding: None.  Movement: Present. Denies leaking of fluid.   The following portions of the patient's history were reviewed and updated as appropriate: allergies, current medications, past family history, past medical history, past social history, past surgical history and problem list. Problem list updated.  Objective:   Filed Vitals:   07/22/15 1032  BP: 126/86  Pulse: 90  Temp: 98.4 F (36.9 C)    Fetal Status: Fetal Heart Rate (bpm): 141   Movement: Present     General:  Alert, oriented and cooperative. Patient is in no acute distress.  Skin: Skin is warm and dry. No rash noted.   Cardiovascular: Normal heart rate noted  Respiratory: Normal respiratory effort, no problems with respiration noted  Abdomen: Soft, gravid, appropriate for gestational age. Pain/Pressure: Absent     Pelvic: Vag. Bleeding: None     Cervical exam deferred        Extremities: Normal range of motion.  Edema: Trace  Mental Status: Normal mood and affect. Normal behavior. Normal judgment and thought content.   Urinalysis: Urine Protein: Negative Urine Glucose: Negative  Assessment and Plan:  Pregnancy: G1P0000 at 7040w1d  1. Supervision of high risk pregnancy, antepartum, second trimester - HIV antibody (with reflex) - RPR - CBC  # BDM - admonished to bring log! And instructed in when to check glucose - a1c  today - growth u/s scheduled in 2 days - optho scheduled  Preterm labor symptoms and general obstetric precautions including but not limited to vaginal bleeding, contractions, leaking of fluid and fetal movement were reviewed in detail with the patient. Please refer to After Visit Summary for other counseling recommendations.    Kathrynn RunningNoah Bedford Albin Duckett, MD

## 2015-07-23 LAB — RPR

## 2015-07-24 ENCOUNTER — Ambulatory Visit (HOSPITAL_COMMUNITY)
Admission: RE | Admit: 2015-07-24 | Discharge: 2015-07-24 | Disposition: A | Discharge status: Home / Self Care | Payer: Medicaid Other | Source: Ambulatory Visit | Attending: Family Medicine | Admitting: Family Medicine

## 2015-07-24 ENCOUNTER — Encounter (HOSPITAL_COMMUNITY): Payer: Self-pay

## 2015-07-24 VITALS — BP 112/72 | HR 97 | Wt 249.4 lb

## 2015-07-24 DIAGNOSIS — Z3A26 26 weeks gestation of pregnancy: Secondary | ICD-10-CM | POA: Insufficient documentation

## 2015-07-24 DIAGNOSIS — O99212 Obesity complicating pregnancy, second trimester: Secondary | ICD-10-CM | POA: Diagnosis not present

## 2015-07-24 DIAGNOSIS — O24319 Unspecified pre-existing diabetes mellitus in pregnancy, unspecified trimester: Secondary | ICD-10-CM

## 2015-07-24 DIAGNOSIS — O0992 Supervision of high risk pregnancy, unspecified, second trimester: Secondary | ICD-10-CM

## 2015-07-24 DIAGNOSIS — O24112 Pre-existing diabetes mellitus, type 2, in pregnancy, second trimester: Secondary | ICD-10-CM | POA: Insufficient documentation

## 2015-07-25 ENCOUNTER — Other Ambulatory Visit (HOSPITAL_COMMUNITY): Payer: Self-pay | Admitting: *Deleted

## 2015-07-25 DIAGNOSIS — O24919 Unspecified diabetes mellitus in pregnancy, unspecified trimester: Secondary | ICD-10-CM

## 2015-07-26 ENCOUNTER — Encounter (HOSPITAL_COMMUNITY): Payer: Self-pay

## 2015-08-05 ENCOUNTER — Encounter: Payer: Self-pay | Admitting: Obstetrics and Gynecology

## 2015-08-21 ENCOUNTER — Other Ambulatory Visit (HOSPITAL_COMMUNITY): Payer: Self-pay | Admitting: Obstetrics and Gynecology

## 2015-08-21 ENCOUNTER — Encounter (HOSPITAL_COMMUNITY): Payer: Self-pay

## 2015-08-21 ENCOUNTER — Ambulatory Visit (HOSPITAL_COMMUNITY)
Admission: RE | Admit: 2015-08-21 | Discharge: 2015-08-21 | Disposition: A | Discharge status: Home / Self Care | Payer: Medicaid Other | Source: Ambulatory Visit | Attending: Family Medicine | Admitting: Family Medicine

## 2015-08-21 DIAGNOSIS — Z3A30 30 weeks gestation of pregnancy: Secondary | ICD-10-CM

## 2015-08-21 DIAGNOSIS — O24113 Pre-existing diabetes mellitus, type 2, in pregnancy, third trimester: Secondary | ICD-10-CM | POA: Insufficient documentation

## 2015-08-21 DIAGNOSIS — O99213 Obesity complicating pregnancy, third trimester: Secondary | ICD-10-CM

## 2015-08-21 DIAGNOSIS — O24919 Unspecified diabetes mellitus in pregnancy, unspecified trimester: Secondary | ICD-10-CM

## 2015-08-23 ENCOUNTER — Encounter (HOSPITAL_COMMUNITY): Payer: Self-pay

## 2015-08-23 ENCOUNTER — Emergency Department (HOSPITAL_COMMUNITY): Payer: Medicaid Other

## 2015-08-23 ENCOUNTER — Emergency Department (HOSPITAL_COMMUNITY)
Admission: EM | Admit: 2015-08-23 | Discharge: 2015-08-23 | Disposition: A | Discharge status: Home / Self Care | Payer: Medicaid Other | Attending: Emergency Medicine | Admitting: Emergency Medicine

## 2015-08-23 DIAGNOSIS — Z794 Long term (current) use of insulin: Secondary | ICD-10-CM | POA: Diagnosis not present

## 2015-08-23 DIAGNOSIS — Y9389 Activity, other specified: Secondary | ICD-10-CM | POA: Diagnosis not present

## 2015-08-23 DIAGNOSIS — Y9289 Other specified places as the place of occurrence of the external cause: Secondary | ICD-10-CM | POA: Insufficient documentation

## 2015-08-23 DIAGNOSIS — Z79899 Other long term (current) drug therapy: Secondary | ICD-10-CM | POA: Diagnosis not present

## 2015-08-23 DIAGNOSIS — O24113 Pre-existing diabetes mellitus, type 2, in pregnancy, third trimester: Secondary | ICD-10-CM | POA: Diagnosis not present

## 2015-08-23 DIAGNOSIS — S91342A Puncture wound with foreign body, left foot, initial encounter: Secondary | ICD-10-CM | POA: Insufficient documentation

## 2015-08-23 DIAGNOSIS — Z8619 Personal history of other infectious and parasitic diseases: Secondary | ICD-10-CM | POA: Diagnosis not present

## 2015-08-23 DIAGNOSIS — S90852A Superficial foreign body, left foot, initial encounter: Secondary | ICD-10-CM

## 2015-08-23 DIAGNOSIS — W1841XA Slipping, tripping and stumbling without falling due to stepping on object, initial encounter: Secondary | ICD-10-CM | POA: Insufficient documentation

## 2015-08-23 DIAGNOSIS — O9A213 Injury, poisoning and certain other consequences of external causes complicating pregnancy, third trimester: Secondary | ICD-10-CM | POA: Insufficient documentation

## 2015-08-23 DIAGNOSIS — Z7982 Long term (current) use of aspirin: Secondary | ICD-10-CM | POA: Diagnosis not present

## 2015-08-23 DIAGNOSIS — Y998 Other external cause status: Secondary | ICD-10-CM | POA: Insufficient documentation

## 2015-08-23 DIAGNOSIS — Z3A30 30 weeks gestation of pregnancy: Secondary | ICD-10-CM | POA: Diagnosis not present

## 2015-08-23 MED ORDER — CEPHALEXIN 500 MG PO CAPS: 500.0000 mg | ORAL_CAPSULE | Freq: Four times a day (QID) | ORAL | Status: DC

## 2015-08-23 NOTE — ED Provider Notes (Signed)
CSN: [id number]     Arrival date & time 08/23/15  1031 History  By signing my name below, I, Soijett Blue, attest that this documentation has been prepared under the direction and in the presence of Hyman Bible, PA-C Electronically Signed: Collins, ED Scribe. 08/23/2015. 12:32 PM.   Chief Complaint  Patient presents with  . Foot Pain      The history is provided by the patient. No language interpreter was used.    Andrea [name] is a 25 y.o. female with a medical hx of DM who presents to the Emergency Department complaining of left foot pain onset last night. Pt reports that she stepped on glass last night and she was unable to remove all of the glass from her foot. Pt states that her left foot pain is worsened with weight bearing. Pt reports that she is 2w5dpregnant at this time.  Pt reports that her sugars typically run in the 70s and 80s and last night her sugar was in the 180s. Pt is having associated symptoms of bruising to bottom of left foot, swelling, tingling to bottom of foot. She notes that she has tried removing the glass without medications for the relief of her symptoms. She denies fever, drainage, contractions, vaginal bleeding/discharge, and any other symptoms.    Past Medical History  Diagnosis Date  . Diabetes mellitus     type 2 on insulin  . Chicken pox    Past Surgical History  Procedure Laterality Date  . No past surgeries     Family History  Problem Relation Age of Onset  . Anesthesia problems Neg Hx   . Stroke Father   . Hypertension Father   . Diabetes Mellitus II Father   . Irregular heart beat Father   . Heart disease Father   . Hyperlipidemia Father   . Diabetes Mellitus II Mother   . Hyperlipidemia Mother   . Thyroid disease Mother   . Hypertension Maternal Aunt   . Thyroid disease Maternal Aunt   . Diabetes Mellitus II Paternal Grandmother    Social History  Substance Use Topics  . Smoking status: Never Smoker   . Smokeless  tobacco: None  . Alcohol Use: Yes     Comment: occasionally   OB History    Gravida Para Term Preterm AB TAB SAB Ectopic Multiple Living   _0     Review of Systems  A complete 10 system review of systems was obtained and all systems are negative except as noted in the HPI and PMH.   Allergies  Review of patient's allergies indicates no known allergies.  Home Medications   Prior to Admission medications   Medication Sig Start Date End Date Taking? Authorizing Provider  aspirin EC 81 MG tablet Take 1 tablet (81 mg total) by mouth daily. Take after 12 weeks for prevention of preeclampsia later in pregnancy 04/15/15   KCaren Macadam MD  Blood Glucose Monitoring Suppl ([place] W/DEVICE KIT Use as advised 10/31/12   CPhilemon Kingdom MD  docusate sodium (PHILLIPS STOOL SOFTENER) 100 MG capsule Take 100 mg by mouth 2 (two) times daily. Reported on 08/21/2015    Historical Provider, MD  glucose blood test strip Use as instructed 03/25/15   TDonnamae Jude MD  insulin glargine (LANTUS) 100 UNIT/ML injection Inject 60 units at bedtime - Solostar pens 06/03/15   KCaren Macadam MD  Lancets ([facility]) lancets  Use as instructed 03/25/15   Donnamae Jude, MD  ONE TOUCH LANCETS MISC Check sugars 3x a day 10/31/12   Philemon Kingdom, MD  Prenatal Multivit-Min-Fe-FA (PRENATAL VITAMINS) 0.8 MG tablet Take 1 tablet by mouth daily. 04/15/15   Caren Macadam, MD   BP 146/76 mmHg  Pulse 83  Temp(Src) 97.9 F (36.6 C) (Oral)  Resp 18  Ht _0  (1.575 m)  Wt 245 lb (111.131 kg)  BMI 44.80 kg/m2  SpO2 100%  LMP 01/20/2015 (Exact Date) Physical Exam  Constitutional: She is oriented to person, place, and time. She appears well-developed and well-nourished. No distress.  HENT:  Head: Normocephalic and atraumatic.  Eyes: EOM are normal.  Neck: Neck supple.  Cardiovascular: Normal rate, regular rhythm and normal heart sounds.  Exam reveals no  gallop and no friction rub.   No murmur heard. Pulmonary/Chest: Effort normal and breath sounds normal. No respiratory distress. She has no wheezes. She has no rales.  Abdominal: She exhibits no distension.  Musculoskeletal: Normal range of motion.  Small puncture wound to ball of left foot over distal third metatarsal. Unable to palpate glass and there is no surrounding erythema, edema, or warmth.  No drainage.  Neurological: She is alert and oriented to person, place, and time.  Skin: Skin is warm and dry.  Psychiatric: She has a normal mood and affect. Her behavior is normal.  Nursing note and vitals reviewed.   ED Course  Procedures (including critical care time) DIAGNOSTIC STUDIES: Oxygen Saturation is 100% on RA, nl by my interpretation.    COORDINATION OF CARE: 12:31 PM Discussed treatment plan with pt at bedside which includes left foot xray, consult with attending, and pt agreed to plan.    Labs Review Labs Reviewed - No data to display  Imaging Review Dg Foot Complete Left  08/23/2015  CLINICAL DATA:  Stepped on glass. Pain and swelling at the third toe. EXAM: LEFT FOOT - COMPLETE 3+ VIEW COMPARISON:  11/15/2011 FINDINGS: Negative for fracture or dislocation. There is a linear radiopaque structure along the plantar aspect of the forefoot, seen on the lateral view. This is concerning for a radiopaque foreign body. This structure roughly measures 1.5 cm. This structure is in the region of the proximal phalanges. IMPRESSION: Evidence for a linear foreign body along the plantar aspect of the forefoot. No acute bone abnormality. Electronically Signed   By: Markus Daft M.D.   On: 08/23/2015 11:51   I have personally reviewed and evaluated these images as part of my medical decision-making.   EKG Interpretation None      MDM   Final diagnoses:  None    Patient X-Ray negative for obvious fracture or dislocation. Xray positive for "IMPRESSION: Evidence for a linear foreign  body along the plantar aspect of the forefoot. No acute bone abnormality." Pt advised to follow up with orthopedics. Patient given post op shoe while in ED. Pt discharged home with keflex Rx and conservative therapy recommended and discussed. Patient will be discharged home & is agreeable with above plan. Returns precautions discussed. Pt appears safe for discharge.  I personally performed the services described in this documentation, which was scribed in my presence. The recorded information has been reviewed and is accurate.    Hyman Bible, PA-C 08/23/15 1420  Milton Ferguson, MD 08/23/15 [phone]

## 2015-08-23 NOTE — ED Notes (Signed)
Pt stepped on glass last night.  Pt is a diabetic.  Pregnant.

## 2015-08-23 NOTE — ED Notes (Signed)
Patient ambulating without difficulty in post op shoe.

## 2015-08-26 ENCOUNTER — Encounter: Payer: Self-pay | Admitting: Obstetrics and Gynecology

## 2015-08-26 ENCOUNTER — Ambulatory Visit (INDEPENDENT_AMBULATORY_CARE_PROVIDER_SITE_OTHER): Payer: Medicaid Other | Admitting: Obstetrics and Gynecology

## 2015-08-26 VITALS — BP 119/55 | HR 86 | Wt 246.5 lb

## 2015-08-26 DIAGNOSIS — O24913 Unspecified diabetes mellitus in pregnancy, third trimester: Secondary | ICD-10-CM | POA: Diagnosis not present

## 2015-08-26 DIAGNOSIS — O99213 Obesity complicating pregnancy, third trimester: Secondary | ICD-10-CM

## 2015-08-26 DIAGNOSIS — E1165 Type 2 diabetes mellitus with hyperglycemia: Secondary | ICD-10-CM | POA: Diagnosis not present

## 2015-08-26 DIAGNOSIS — O0993 Supervision of high risk pregnancy, unspecified, third trimester: Secondary | ICD-10-CM

## 2015-08-26 DIAGNOSIS — IMO0001 Reserved for inherently not codable concepts without codable children: Secondary | ICD-10-CM

## 2015-08-26 DIAGNOSIS — O24313 Unspecified pre-existing diabetes mellitus in pregnancy, third trimester: Secondary | ICD-10-CM

## 2015-08-26 DIAGNOSIS — E669 Obesity, unspecified: Secondary | ICD-10-CM

## 2015-08-26 LAB — POCT URINALYSIS DIP (DEVICE)
BILIRUBIN URINE: NEGATIVE
Bilirubin Urine: NEGATIVE
GLUCOSE, UA: NEGATIVE mg/dL
Glucose, UA: NEGATIVE mg/dL
Hgb urine dipstick: NEGATIVE
Ketones, ur: NEGATIVE mg/dL
Ketones, ur: NEGATIVE mg/dL
NITRITE: NEGATIVE
NITRITE: NEGATIVE
Protein, ur: NEGATIVE mg/dL
Protein, ur: NEGATIVE mg/dL
Specific Gravity, Urine: 1.02 (ref 1.005–1.030)
Specific Gravity, Urine: 1.02 (ref 1.005–1.030)
UROBILINOGEN UA: 0.2 mg/dL (ref 0.0–1.0)
Urobilinogen, UA: 0.2 mg/dL (ref 0.0–1.0)
pH: 6 (ref 5.0–8.0)
pH: 5.5 (ref 5.0–8.0)

## 2015-08-26 NOTE — Progress Notes (Signed)
Subjective:  Andrea BrineOlivia J [name] is a 25 y.o. G1P0000 at 6638w1d being seen today for ongoing prenatal care.  She is currently monitored for the following issues for this high-risk pregnancy and has Diabetes mellitus type 2, uncontrolled, without complications (HCC); Preexisting diabetes complicating pregnancy, antepartum; Supervision of high risk pregnancy, antepartum; and Obesity affecting pregnancy, antepartum on her problem list.  Patient reports no complaints.  Contractions: Not present.  .  Movement: Present. Denies leaking of fluid.   The following portions of the patient's history were reviewed and updated as appropriate: allergies, current medications, past family history, past medical history, past social history, past surgical history and problem list. Problem list updated.  Objective:   Filed Vitals:   08/26/15 1106  BP: 119/55  Pulse: 86  Weight: 246 lb 8 oz (111.812 kg)    Fetal Status: Fetal Heart Rate (bpm): 135 Fundal Height: 31 cm Movement: Present     General:  Alert, oriented and cooperative. Patient is in no acute distress.  Skin: Skin is warm and dry. No rash noted.   Cardiovascular: Normal heart rate noted  Respiratory: Normal respiratory effort, no problems with respiration noted  Abdomen: Soft, gravid, appropriate for gestational age. Pain/Pressure: Absent     Pelvic:       Cervical exam deferred        Extremities: Normal range of motion.     Mental Status: Normal mood and affect. Normal behavior. Normal judgment and thought content.   Urinalysis: Urine Protein: Negative Urine Glucose: Negative  Assessment and Plan:  Pregnancy: G1P0000 at 1438w1d  1. Supervision of high risk pregnancy, antepartum, third trimester Patient is doing well. She has a foreign object in her left foot (walked on a piece of glass) for which she has follow up arrangement with gen surgery on 5/16  2. Obesity affecting pregnancy, antepartum, third trimester   3. Preexisting diabetes  complicating pregnancy, antepartum, third trimester CBGs reviewed and all within range Continue lantus Will start twice weekly testing next week Follow up growth in June  4. Uncontrolled diabetes mellitus type 2 without complications, unspecified long term insulin use status (HCC)   Preterm labor symptoms and general obstetric precautions including but not limited to vaginal bleeding, contractions, leaking of fluid and fetal movement were reviewed in detail with the patient. Please refer to After Visit Summary for other counseling recommendations.  Return in about 1 week (around 09/02/2015) for ROB with NST.   Catalina AntiguaPeggy Aceyn Kathol, MD

## 2015-08-27 ENCOUNTER — Other Ambulatory Visit: Payer: Self-pay | Admitting: Orthopedic Surgery

## 2015-08-27 ENCOUNTER — Encounter (HOSPITAL_COMMUNITY): Payer: Self-pay | Admitting: *Deleted

## 2015-08-27 NOTE — Progress Notes (Signed)
Anesthesia Chart Review:  Pt is a 25 year old female scheduled for removal of foreign body (glass) from L foot on 08/28/2015 with Dr. Sherlean FootLucey. Procedure is posted for MAC with regional block.   Pt will be 2266w3d pregnant DOS.   Pt is a same day work up.   [facility], last visit 08/26/15, no new issues identified at that visit.   PMH includes:  DM. Never smoker. BMI 45  Medications include: ASA, lantus, prenatal vitamins  Labs will be obtained DOS.   I notified Dr. Katrinka BlazingSmith with neonatology about impending surgery.   I notified Christine, Rapid Response OB RN, about impending surgery. Holding should call again DOS when pt arrives to holding.   If labs acceptable DOS, I anticipate pt can proceed as scheduled.   Rica Mastngela Alisa Stjames, FNP-BC [facility] Phone: [phone] 08/27/2015 2:35 PM

## 2015-08-27 NOTE — Progress Notes (Addendum)
Pt is [redacted] weeks pregnant and has type 2 diabetes. Denies any cardiac history. Pt's last  A1C was 5.8 on 07/22/15. She takes Lantus 60 units daily. When asked if she takes it at night or morning, she stated that she takes "whenever during the day" if her blood sugar needs to come down or depending on what she has eaten. She sometimes takes it at night, sometimes in the morning or midday or even twice a day if she thinks she needs it. She took it after lunch today. States she probably won't take it this PM. I instructed her not to take tonight but in the AM to take 1/2 of her regular dose of Lantus. She voiced understanding. She states that her FBS usually run between 60-90. I instructed her to check her blood sugar every 2 hours after waking up until she leaves for the hospital. If blood sugar is 70 or below, treat with 1/2 cup of clear juice (apple or cranberry) and recheck blood sugar 15 minutes after drinking juice. If blood sugar continues to be 70 or below, call the Short Stay department and ask to speak to a nurse. She voiced understanding. Chart to be reviewed by Joslyn HyA. Kabbe, NP.

## 2015-08-28 ENCOUNTER — Encounter (HOSPITAL_COMMUNITY)
Admission: RE | Disposition: A | Discharge status: Home / Self Care | Payer: Self-pay | Source: Ambulatory Visit | Attending: Orthopedic Surgery

## 2015-08-28 ENCOUNTER — Ambulatory Visit (HOSPITAL_COMMUNITY): Payer: Medicaid Other | Admitting: Emergency Medicine

## 2015-08-28 ENCOUNTER — Ambulatory Visit (HOSPITAL_COMMUNITY)
Admission: RE | Admit: 2015-08-28 | Discharge: 2015-08-28 | Disposition: A | Discharge status: Home / Self Care | Payer: Medicaid Other | Source: Ambulatory Visit | Attending: Orthopedic Surgery | Admitting: Orthopedic Surgery

## 2015-08-28 ENCOUNTER — Encounter (HOSPITAL_COMMUNITY): Payer: Self-pay | Admitting: *Deleted

## 2015-08-28 DIAGNOSIS — O10013 Pre-existing essential hypertension complicating pregnancy, third trimester: Secondary | ICD-10-CM | POA: Diagnosis not present

## 2015-08-28 DIAGNOSIS — E119 Type 2 diabetes mellitus without complications: Secondary | ICD-10-CM | POA: Diagnosis not present

## 2015-08-28 DIAGNOSIS — O9A213 Injury, poisoning and certain other consequences of external causes complicating pregnancy, third trimester: Secondary | ICD-10-CM | POA: Diagnosis not present

## 2015-08-28 DIAGNOSIS — O24319 Unspecified pre-existing diabetes mellitus in pregnancy, unspecified trimester: Secondary | ICD-10-CM | POA: Insufficient documentation

## 2015-08-28 DIAGNOSIS — O24113 Pre-existing diabetes mellitus, type 2, in pregnancy, third trimester: Secondary | ICD-10-CM | POA: Insufficient documentation

## 2015-08-28 DIAGNOSIS — O99213 Obesity complicating pregnancy, third trimester: Secondary | ICD-10-CM | POA: Insufficient documentation

## 2015-08-28 DIAGNOSIS — Z6841 Body Mass Index (BMI) 40.0 and over, adult: Secondary | ICD-10-CM | POA: Insufficient documentation

## 2015-08-28 DIAGNOSIS — O26893 Other specified pregnancy related conditions, third trimester: Secondary | ICD-10-CM | POA: Insufficient documentation

## 2015-08-28 DIAGNOSIS — S90852A Superficial foreign body, left foot, initial encounter: Secondary | ICD-10-CM | POA: Diagnosis present

## 2015-08-28 DIAGNOSIS — Z3A31 31 weeks gestation of pregnancy: Secondary | ICD-10-CM | POA: Insufficient documentation

## 2015-08-28 HISTORY — PX: FOREIGN BODY REMOVAL: SHX962

## 2015-08-28 HISTORY — DX: Anemia, unspecified: D64.9

## 2015-08-28 LAB — BASIC METABOLIC PANEL
Anion gap: 9 (ref 5–15)
CO2: 21 mmol/L — ABNORMAL LOW (ref 22–32)
CREATININE: 0.54 mg/dL (ref 0.44–1.00)
Calcium: 8.6 mg/dL — ABNORMAL LOW (ref 8.9–10.3)
Chloride: 107 mmol/L (ref 101–111)
Glucose, Bld: 62 mg/dL — ABNORMAL LOW (ref 65–99)
Potassium: 3.8 mmol/L (ref 3.5–5.1)
SODIUM: 137 mmol/L (ref 135–145)

## 2015-08-28 LAB — GLUCOSE, CAPILLARY
GLUCOSE-CAPILLARY: 59 mg/dL — AB (ref 65–99)
GLUCOSE-CAPILLARY: 84 mg/dL (ref 65–99)
Glucose-Capillary: 51 mg/dL — ABNORMAL LOW (ref 65–99)
Glucose-Capillary: 81 mg/dL (ref 65–99)
Glucose-Capillary: 73 mg/dL (ref 65–99)

## 2015-08-28 SURGERY — REMOVAL FOREIGN BODY EXTREMITY
Anesthesia: Monitor Anesthesia Care | Laterality: Left

## 2015-08-28 MED ORDER — ACETAMINOPHEN 500 MG PO TABS
1000.0000 mg | ORAL_TABLET | Freq: Once | ORAL | Status: AC
  Administered 2015-08-28: 1000 mg via ORAL
  Filled 2015-08-28: qty 2

## 2015-08-28 MED ORDER — BUPIVACAINE-EPINEPHRINE (PF) 0.5% -1:200000 IJ SOLN
INTRAMUSCULAR | Status: DC | PRN
  Administered 2015-08-28: 30 mL via PERINEURAL

## 2015-08-28 MED ORDER — OXYCODONE HCL 5 MG PO TABS: 5.0000 mg | ORAL_TABLET | Freq: Once | ORAL | Status: DC | PRN

## 2015-08-28 MED ORDER — CHLORHEXIDINE GLUCONATE 4 % EX LIQD: 60.0000 mL | Freq: Once | CUTANEOUS | Status: DC

## 2015-08-28 MED ORDER — BUPIVACAINE HCL (PF) 0.25 % IJ SOLN
INTRAMUSCULAR | Status: AC
  Filled 2015-08-28: qty 30

## 2015-08-28 MED ORDER — DEXTROSE 50 % IV SOLN
25.0000 mL | Freq: Once | INTRAVENOUS | Status: AC
  Administered 2015-08-28: 25 mL via INTRAVENOUS
  Filled 2015-08-28: qty 50

## 2015-08-28 MED ORDER — DEXTROSE 50 % IV SOLN
INTRAVENOUS | Status: AC
  Filled 2015-08-28: qty 50

## 2015-08-28 MED ORDER — FENTANYL CITRATE (PF) 100 MCG/2ML IJ SOLN
50.0000 ug | Freq: Once | INTRAMUSCULAR | Status: AC
  Administered 2015-08-28: 50 ug via INTRAVENOUS

## 2015-08-28 MED ORDER — ONDANSETRON HCL 4 MG/2ML IJ SOLN: 4.0000 mg | Freq: Four times a day (QID) | INTRAMUSCULAR | Status: DC | PRN

## 2015-08-28 MED ORDER — PROPOFOL 500 MG/50ML IV EMUL
INTRAVENOUS | Status: DC | PRN
Start: 2015-08-28 — End: 2015-08-28
  Administered 2015-08-28: 100 ug/kg/min via INTRAVENOUS

## 2015-08-28 MED ORDER — CEFAZOLIN SODIUM-DEXTROSE 2-4 GM/100ML-% IV SOLN
2.0000 g | INTRAVENOUS | Status: AC
  Administered 2015-08-28: 2 g via INTRAVENOUS
  Filled 2015-08-28: qty 100

## 2015-08-28 MED ORDER — BUPIVACAINE HCL (PF) 0.25 % IJ SOLN
INTRAMUSCULAR | Status: DC | PRN
  Administered 2015-08-28: 4 mL
  Administered 2015-08-28: 6 mL

## 2015-08-28 MED ORDER — FENTANYL CITRATE (PF) 100 MCG/2ML IJ SOLN: 25.0000 ug | INTRAMUSCULAR | Status: DC | PRN

## 2015-08-28 MED ORDER — FENTANYL CITRATE (PF) 100 MCG/2ML IJ SOLN
INTRAMUSCULAR | Status: AC
  Filled 2015-08-28: qty 2

## 2015-08-28 MED ORDER — LACTATED RINGERS IV SOLN
INTRAVENOUS | Status: DC
  Administered 2015-08-28: 12:00:00 via INTRAVENOUS

## 2015-08-28 MED ORDER — LIDOCAINE HCL (CARDIAC) 20 MG/ML IV SOLN
INTRAVENOUS | Status: DC | PRN
  Administered 2015-08-28: 40 mg via INTRAVENOUS

## 2015-08-28 MED ORDER — OXYCODONE HCL 5 MG/5ML PO SOLN: 5.0000 mg | Freq: Once | ORAL | Status: DC | PRN

## 2015-08-28 MED ORDER — 0.9 % SODIUM CHLORIDE (POUR BTL) OPTIME
TOPICAL | Status: DC | PRN
  Administered 2015-08-28: 1000 mL

## 2015-08-28 SURGICAL SUPPLY — 46 items
BAG URINE DRAINAGE (UROLOGICAL SUPPLIES) IMPLANT
BANDAGE ESMARK 6X9 LF (GAUZE/BANDAGES/DRESSINGS) ×1 IMPLANT
BNDG CMPR 9X6 STRL LF SNTH (GAUZE/BANDAGES/DRESSINGS) ×1
BNDG COHESIVE 4X5 TAN STRL (GAUZE/BANDAGES/DRESSINGS) ×2 IMPLANT
BNDG ESMARK 6X9 LF (GAUZE/BANDAGES/DRESSINGS) ×3
BNDG GAUZE ELAST 4 BULKY (GAUZE/BANDAGES/DRESSINGS) ×2 IMPLANT
COVER SURGICAL LIGHT HANDLE (MISCELLANEOUS) ×3 IMPLANT
CUFF TOURNIQUET SINGLE 34IN LL (TOURNIQUET CUFF) IMPLANT
DRAPE EXTREMITY T 121X128X90 (DRAPE) ×3 IMPLANT
DRAPE PROXIMA HALF (DRAPES) ×3 IMPLANT
DRAPE U-SHAPE 47X51 STRL (DRAPES) ×3 IMPLANT
DRSG ADAPTIC 3X8 NADH LF (GAUZE/BANDAGES/DRESSINGS) ×3 IMPLANT
DRSG PAD ABDOMINAL 8X10 ST (GAUZE/BANDAGES/DRESSINGS) ×1 IMPLANT
DURAPREP 26ML APPLICATOR (WOUND CARE) ×4 IMPLANT
ELECT REM PT RETURN 9FT ADLT (ELECTROSURGICAL) ×3
ELECTRODE REM PT RTRN 9FT ADLT (ELECTROSURGICAL) ×1 IMPLANT
EVACUATOR 1/8 PVC DRAIN (DRAIN) ×1 IMPLANT
GAUZE SPONGE 4X4 12PLY STRL (GAUZE/BANDAGES/DRESSINGS) ×3 IMPLANT
GLOVE BIOGEL PI IND STRL 7.5 (GLOVE) IMPLANT
GLOVE BIOGEL PI IND STRL 8.5 (GLOVE) ×5 IMPLANT
GLOVE BIOGEL PI INDICATOR 7.5 (GLOVE)
GLOVE BIOGEL PI INDICATOR 8.5 (GLOVE) ×2
GLOVE SURG ORTHO 7.0 STRL STRW (GLOVE) ×2 IMPLANT
GLOVE SURG ORTHO 8.0 STRL STRW (GLOVE) ×8 IMPLANT
GOWN STRL REUS W/ TWL LRG LVL3 (GOWN DISPOSABLE) ×2 IMPLANT
GOWN STRL REUS W/ TWL XL LVL3 (GOWN DISPOSABLE) ×2 IMPLANT
GOWN STRL REUS W/TWL 2XL LVL3 (GOWN DISPOSABLE) ×3 IMPLANT
GOWN STRL REUS W/TWL LRG LVL3 (GOWN DISPOSABLE) ×6
GOWN STRL REUS W/TWL XL LVL3 (GOWN DISPOSABLE) ×6
KIT BASIN OR (CUSTOM PROCEDURE TRAY) ×3 IMPLANT
KIT ROOM TURNOVER OR (KITS) ×3 IMPLANT
MANIFOLD NEPTUNE II (INSTRUMENTS) ×1 IMPLANT
NS IRRIG 1000ML POUR BTL (IV SOLUTION) ×3 IMPLANT
PACK GENERAL/GYN (CUSTOM PROCEDURE TRAY) ×3 IMPLANT
PAD ARMBOARD 7.5X6 YLW CONV (MISCELLANEOUS) ×6 IMPLANT
PADDING CAST COTTON 6X4 STRL (CAST SUPPLIES) ×3 IMPLANT
STAPLER VISISTAT 35W (STAPLE) ×1 IMPLANT
SUCTION FRAZIER HANDLE 10FR (MISCELLANEOUS) ×2
SUCTION TUBE FRAZIER 10FR DISP (MISCELLANEOUS) ×1 IMPLANT
SUT MON AB 4-0 PC3 18 (SUTURE) ×2 IMPLANT
SUT VIC AB 0 CTB1 27 (SUTURE) ×6 IMPLANT
SYR 20CC LL (SYRINGE) ×4 IMPLANT
SYRINGE 10CC LL (SYRINGE) ×2 IMPLANT
TOWEL OR 17X24 6PK STRL BLUE (TOWEL DISPOSABLE) ×3 IMPLANT
TOWEL OR 17X26 10 PK STRL BLUE (TOWEL DISPOSABLE) ×3 IMPLANT
WATER STERILE IRR 1000ML POUR (IV SOLUTION) ×3 IMPLANT

## 2015-08-28 NOTE — Progress Notes (Signed)
cbg [id number] Dr Chaney MallingHodierne called and informed new orders noted

## 2015-08-28 NOTE — Progress Notes (Signed)
[facility]. States she will be over soon.

## 2015-08-28 NOTE — Anesthesia Procedure Notes (Signed)
Anesthesia Regional Block:  Popliteal block  Pre-Anesthetic Checklist: ,, timeout performed, Correct Patient, Correct Site, Correct Laterality, Correct Procedure, Correct Position, site marked, Risks and benefits discussed,  Surgical consent,  Pre-op evaluation,  At surgeon's request and post-op pain management  Laterality: Left  Prep: chloraprep       Needles:  Injection technique: Single-shot  Needle Type: Echogenic Stimulator Needle     Needle Length: 9cm 9 cm Needle Gauge: 21 and 21 G    Additional Needles:  Procedures: ultrasound guided (picture in chart) and nerve stimulator Popliteal block  Nerve Stimulator or Paresthesia:  Response: plantar flexion of foot, 0.45 mA,   Additional Responses:   Narrative:  Start time: 08/28/2015 1:35 PM End time: 08/28/2015 1:45 PM Injection made incrementally with aspirations every 5 mL.  Performed by: Personally  Anesthesiologist: Lafayette Dunlevy  Additional Notes: Functioning IV was confirmed and monitors were applied.  A 90mm 21ga Arrow echogenic stimulator needle was used. Sterile prep and drape,hand hygiene and sterile gloves were used.  Negative aspiration and negative test dose prior to incremental administration of local anesthetic. The patient tolerated the procedure well.  Ultrasound guidance: relevent anatomy identified, needle position confirmed, local anesthetic spread visualized around nerve(s), vascular puncture avoided.  Image printed for medical record.

## 2015-08-28 NOTE — Anesthesia Preprocedure Evaluation (Signed)
Anesthesia Evaluation  Patient identified by MRN, date of birth, ID band Patient awake    Reviewed: Allergy & Precautions, NPO status , Patient's Chart, lab work & pertinent test results  Airway Mallampati: II   Neck ROM: full    Dental   Pulmonary neg pulmonary ROS,    breath sounds clear to auscultation       Cardiovascular hypertension,  Rhythm:regular Rate:Normal     Neuro/Psych    GI/Hepatic   Endo/Other  diabetes, Type obesity  Renal/GU      Musculoskeletal   Abdominal   Peds  Hematology   Anesthesia Other Findings   Reproductive/Obstetrics (+) Pregnancy [redacted] weeks pregnant                             Anesthesia Physical Anesthesia Plan  ASA: II  Anesthesia Plan: MAC and Regional   Post-op Pain Management:    Induction: Intravenous  Airway Management Planned: Simple Face Mask  Additional Equipment:   Intra-op Plan:   Post-operative Plan:   Informed Consent: I have reviewed the patients History and Physical, chart, labs and discussed the procedure including the risks, benefits and alternatives for the proposed anesthesia with the patient or authorized representative who has indicated his/her understanding and acceptance.     Plan Discussed with: CRNA, Anesthesiologist and Surgeon  Anesthesia Plan Comments:         Anesthesia Quick Evaluation

## 2015-08-28 NOTE — Progress Notes (Addendum)
1255 Here for pre-op NST for this 25 yo G1P0 @31 .[redacted] wks GA in for removal of foreign body left foot.  FHR reassuring.  1540  Post-op NST complete.  FHR reassuring.

## 2015-08-28 NOTE — Progress Notes (Signed)
This note also relates to the following rows which could not be included: Pulse Rate - Cannot attach notes to unvalidated device data Resp - Cannot attach notes to unvalidated device data SpO2 - Cannot attach notes to unvalidated device data   Frequent fetal movement palpated.

## 2015-08-28 NOTE — Discharge Instructions (Signed)
Keep wound covered until healed  Progress weight bearing as tolerated by pain  Change dressing daily  Tylenol for pain  May get wet starting tomorrow   Recheck in the office in 1 week

## 2015-08-28 NOTE — Transfer of Care (Signed)
Immediate Anesthesia Transfer of Care Note  Patient: Andrea BrineOlivia J Wharton  Procedure(s) Performed: Procedure(s) with comments: REMOVAL FOREIGN BODY foot (Left) - regional block  Patient Location: PACU  Anesthesia Type:MAC combined with regional for post-op pain  Level of Consciousness: awake  Airway & Oxygen Therapy: Patient Spontanous Breathing  Post-op Assessment: Report given to RN and Post -op Vital signs reviewed and stable  Post vital signs: stable  Last Vitals:  Filed Vitals:   08/28/15 1410 08/28/15 1504  BP: 120/57   Pulse: 80 81  Temp:  36.4 C  Resp: 16 15    Last Pain:  Filed Vitals:   08/28/15 1505  PainSc: 0-No pain      Patients Stated Pain Goal: 1 (08/28/15 1204)  Complications: No apparent anesthesia complications

## 2015-08-28 NOTE — Progress Notes (Signed)
Patient's blood sugar 59 on arrival to short stay.  Dr. Chaney MallingHodierne notified and received verbal order to give 1/2 amp of D50.  Will recheck blood sugar and continue to monitor patient.

## 2015-08-29 ENCOUNTER — Encounter (HOSPITAL_COMMUNITY): Payer: Self-pay | Admitting: Orthopedic Surgery

## 2015-08-29 NOTE — Anesthesia Postprocedure Evaluation (Signed)
Anesthesia Post Note  Patient: Andrea BrineOlivia J Wharton  Procedure(s) Performed: Procedure(s) (LRB): REMOVAL FOREIGN BODY foot (Left)  Patient location during evaluation: PACU Anesthesia Type: MAC Level of consciousness: awake and alert Pain management: pain level controlled Vital Signs Assessment: post-procedure vital signs reviewed and stable Respiratory status: spontaneous breathing, nonlabored ventilation, respiratory function stable and patient connected to nasal cannula oxygen Cardiovascular status: stable and blood pressure returned to baseline Anesthetic complications: no    Last Vitals:  Filed Vitals:   08/28/15 1545 08/28/15 1603  BP:  115/82  Pulse: 83 71  Temp:    Resp: 20 18    Last Pain:  Filed Vitals:   08/28/15 1645  PainSc: 0-No pain                 Royal Vandevoort S

## 2015-08-29 NOTE — Op Note (Signed)
Dictation Number:  [id number]

## 2015-09-02 ENCOUNTER — Ambulatory Visit (INDEPENDENT_AMBULATORY_CARE_PROVIDER_SITE_OTHER): Payer: Medicaid Other | Admitting: Obstetrics & Gynecology

## 2015-09-02 VITALS — BP 124/70 | HR 87 | Wt 246.6 lb

## 2015-09-02 DIAGNOSIS — O0993 Supervision of high risk pregnancy, unspecified, third trimester: Secondary | ICD-10-CM

## 2015-09-02 DIAGNOSIS — O24913 Unspecified diabetes mellitus in pregnancy, third trimester: Secondary | ICD-10-CM | POA: Diagnosis present

## 2015-09-02 DIAGNOSIS — O99213 Obesity complicating pregnancy, third trimester: Secondary | ICD-10-CM | POA: Diagnosis not present

## 2015-09-02 DIAGNOSIS — E669 Obesity, unspecified: Secondary | ICD-10-CM

## 2015-09-02 DIAGNOSIS — O24313 Unspecified pre-existing diabetes mellitus in pregnancy, third trimester: Secondary | ICD-10-CM

## 2015-09-02 LAB — POCT URINALYSIS DIP (DEVICE)
BILIRUBIN URINE: NEGATIVE
Glucose, UA: NEGATIVE mg/dL
Hgb urine dipstick: NEGATIVE
Ketones, ur: NEGATIVE mg/dL
NITRITE: NEGATIVE
Protein, ur: NEGATIVE mg/dL
Specific Gravity, Urine: 1.02 (ref 1.005–1.030)
Urobilinogen, UA: 0.2 mg/dL (ref 0.0–1.0)
pH: 5.5 (ref 5.0–8.0)

## 2015-09-02 NOTE — Op Note (Addendum)
[name]:  [name], Andrea              ACCOUNT NO.:  [id number]  MEDICAL RECORD NO.:  [id number]  LOCATION:                                 FACILITY:  PHYSICIAN:  Mila HomerStephen D. Sherlean FootLucey, M.D. DATE OF BIRTH:  1991-01-15  DATE OF PROCEDURE:  08/28/2015 DATE OF DISCHARGE:  08/28/2015                              OPERATIVE REPORT   SURGEON:  Mila HomerStephen D. Sherlean FootLucey, M.D.  ASSISTANT:  None.  ANESTHESIA:  Regional block with IV sedation.  PREOPERATIVE DIAGNOSIS:  Left foot, foreign body (glass).  POSTOPERATIVE DIAGNOSIS:  Left foot, foreign body (glass).  PROCEDURE:  Left foot foreign body removal with deep irrigation and debridement.  INDICATION FOR PROCEDURE:  The patient is a 25 year old African American woman who is pregnant, stepped on glass couple of days ago, was sent me on call and informed consent was obtained for loose body removal.  DESCRIPTION OF PROCEDURE:  The patient was laid supine, administered regional anesthesia plus IV sedation, and the left ankle and foot were prepped and draped in usual fashion.  The extremity was exsanguinated to the ankle and left in place.  I used the #15 blade to ellipse the incision.  I then deeply probed in subfascial layer and found the glass. I used [employer] imaging for this.  I then irrigated the deep space and the superficial space.  I then dressed with Xeroform dressing, sponges, Coban, and the let tourniquet down.  COMPLICATIONS:  None.  DRAINS:  None.    ______________________________ Mila HomerStephen D. Sherlean FootLucey, M.D.   ______________________________ Mila HomerStephen D. Sherlean FootLucey, M.D.   SDL/MEDQ  D:  08/29/2015  T:  08/30/2015  Job:  [id number]

## 2015-09-02 NOTE — Progress Notes (Signed)
Pt states she had 2 episodes of clear fluid leaking from vagina today.

## 2015-09-02 NOTE — Patient Instructions (Signed)

## 2015-09-02 NOTE — Progress Notes (Signed)
Subjective: had pink stained vaginal discharge this morning  Andrea [name] is a 25 y.o. G1P0000 at 5159w1d being seen today for ongoing prenatal care.  She is currently monitored for the following issues for this high-risk pregnancy and has Diabetes mellitus type 2, uncontrolled, without complications (HCC); Preexisting diabetes complicating pregnancy, antepartum; Supervision of high risk pregnancy, antepartum; and Obesity affecting pregnancy, antepartum on her problem list. She did not bring her logbook, state normal range except 200 last night Patient reports vaginal drainage .  Contractions: Not present. Vag. Bleeding: None.  Movement: Present. Denies leaking of fluid. Pink discharge this am  The following portions of the patient's history were reviewed and updated as appropriate: allergies, current medications, past family history, past medical history, past social history, past surgical history and problem list. Problem list updated.  Objective:   Filed Vitals:   09/02/15 1438  BP: 124/70  Pulse: 87  Weight: 246 lb 9.6 oz (111.857 kg)    Fetal Status: Fetal Heart Rate (bpm): NST   Movement: Present     General:  Alert, oriented and cooperative. Patient is in no acute distress.  Skin: Skin is warm and dry. No rash noted.   Cardiovascular: Normal heart rate noted  Respiratory: Normal respiratory effort, no problems with respiration noted  Abdomen: Soft, gravid, appropriate for gestational age. Pain/Pressure: Present     Pelvic: Vag. Bleeding: None     Cervical exam performed      no pool or blood, negative fern test done by me  Extremities: Normal range of motion.  Edema: None  Mental Status: Normal mood and affect. Normal behavior. Normal judgment and thought content.   Urinalysis: Urine Protein: Negative Urine Glucose: Negative  Assessment and Plan:  Pregnancy: G1P0000 at 9859w1d  1. Preexisting diabetes complicating pregnancy, antepartum, third trimester Report most normal  range - Fetal nonstress test  2. Supervision of high risk pregnancy, antepartum, third trimester NST reactive today. No sign of ROM  Preterm labor symptoms and general obstetric precautions including but not limited to vaginal bleeding, contractions, leaking of fluid and fetal movement were reviewed in detail with the patient. Please refer to After Visit Summary for other counseling recommendations.  Return in about 8 days (around 09/10/2015) for Ob fu and NST @ 1330 (overbook).   Andrea PhenixJames G Arnold, MD

## 2015-09-03 ENCOUNTER — Inpatient Hospital Stay (HOSPITAL_COMMUNITY)
Admission: AD | Admit: 2015-09-03 | Discharge: 2015-09-19 | DRG: 765 | Disposition: A | Discharge status: Home / Self Care | Payer: Medicaid Other | Source: Ambulatory Visit | Attending: Obstetrics & Gynecology | Admitting: Obstetrics & Gynecology

## 2015-09-03 ENCOUNTER — Encounter (HOSPITAL_COMMUNITY): Payer: Self-pay | Admitting: *Deleted

## 2015-09-03 DIAGNOSIS — O42113 Preterm premature rupture of membranes, onset of labor more than 24 hours following rupture, third trimester: Principal | ICD-10-CM | POA: Diagnosis present

## 2015-09-03 DIAGNOSIS — Z3A32 32 weeks gestation of pregnancy: Secondary | ICD-10-CM

## 2015-09-03 DIAGNOSIS — Z823 Family history of stroke: Secondary | ICD-10-CM | POA: Diagnosis not present

## 2015-09-03 DIAGNOSIS — O24113 Pre-existing diabetes mellitus, type 2, in pregnancy, third trimester: Secondary | ICD-10-CM | POA: Diagnosis present

## 2015-09-03 DIAGNOSIS — O99214 Obesity complicating childbirth: Secondary | ICD-10-CM | POA: Diagnosis present

## 2015-09-03 DIAGNOSIS — Z833 Family history of diabetes mellitus: Secondary | ICD-10-CM | POA: Diagnosis not present

## 2015-09-03 DIAGNOSIS — E669 Obesity, unspecified: Secondary | ICD-10-CM | POA: Diagnosis present

## 2015-09-03 DIAGNOSIS — O2412 Pre-existing diabetes mellitus, type 2, in childbirth: Secondary | ICD-10-CM | POA: Diagnosis not present

## 2015-09-03 DIAGNOSIS — O34219 Maternal care for unspecified type scar from previous cesarean delivery: Secondary | ICD-10-CM

## 2015-09-03 DIAGNOSIS — O0990 Supervision of high risk pregnancy, unspecified, unspecified trimester: Secondary | ICD-10-CM

## 2015-09-03 DIAGNOSIS — Z6841 Body Mass Index (BMI) 40.0 and over, adult: Secondary | ICD-10-CM | POA: Diagnosis not present

## 2015-09-03 DIAGNOSIS — Z794 Long term (current) use of insulin: Secondary | ICD-10-CM | POA: Diagnosis not present

## 2015-09-03 DIAGNOSIS — O42913 Preterm premature rupture of membranes, unspecified as to length of time between rupture and onset of labor, third trimester: Secondary | ICD-10-CM

## 2015-09-03 DIAGNOSIS — E1165 Type 2 diabetes mellitus with hyperglycemia: Secondary | ICD-10-CM | POA: Diagnosis present

## 2015-09-03 DIAGNOSIS — O42919 Preterm premature rupture of membranes, unspecified as to length of time between rupture and onset of labor, unspecified trimester: Secondary | ICD-10-CM | POA: Diagnosis not present

## 2015-09-03 DIAGNOSIS — Z3A34 34 weeks gestation of pregnancy: Secondary | ICD-10-CM

## 2015-09-03 DIAGNOSIS — O4290 Premature rupture of membranes, unspecified as to length of time between rupture and onset of labor, unspecified weeks of gestation: Secondary | ICD-10-CM | POA: Diagnosis present

## 2015-09-03 DIAGNOSIS — E119 Type 2 diabetes mellitus without complications: Secondary | ICD-10-CM | POA: Diagnosis not present

## 2015-09-03 DIAGNOSIS — Z3A33 33 weeks gestation of pregnancy: Secondary | ICD-10-CM | POA: Diagnosis not present

## 2015-09-03 DIAGNOSIS — O42013 Preterm premature rupture of membranes, onset of labor within 24 hours of rupture, third trimester: Secondary | ICD-10-CM | POA: Diagnosis present

## 2015-09-03 DIAGNOSIS — Z8249 Family history of ischemic heart disease and other diseases of the circulatory system: Secondary | ICD-10-CM | POA: Diagnosis not present

## 2015-09-03 DIAGNOSIS — O99210 Obesity complicating pregnancy, unspecified trimester: Secondary | ICD-10-CM | POA: Diagnosis present

## 2015-09-03 DIAGNOSIS — O24319 Unspecified pre-existing diabetes mellitus in pregnancy, unspecified trimester: Secondary | ICD-10-CM | POA: Diagnosis present

## 2015-09-03 DIAGNOSIS — Z98891 History of uterine scar from previous surgery: Secondary | ICD-10-CM

## 2015-09-03 LAB — OB RESULTS CONSOLE GC/CHLAMYDIA: GC PROBE AMP, GENITAL: NEGATIVE

## 2015-09-03 LAB — URINE MICROSCOPIC-ADD ON

## 2015-09-03 LAB — GLUCOSE, CAPILLARY
Glucose-Capillary: 136 mg/dL — ABNORMAL HIGH (ref 65–99)
Glucose-Capillary: 254 mg/dL — ABNORMAL HIGH (ref 65–99)

## 2015-09-03 LAB — URINALYSIS, ROUTINE W REFLEX MICROSCOPIC
Bilirubin Urine: NEGATIVE
Glucose, UA: NEGATIVE mg/dL
Hgb urine dipstick: NEGATIVE
Ketones, ur: NEGATIVE mg/dL
NITRITE: NEGATIVE
PROTEIN: NEGATIVE mg/dL
Specific Gravity, Urine: 1.02 (ref 1.005–1.030)
pH: 6 (ref 5.0–8.0)

## 2015-09-03 LAB — OB RESULTS CONSOLE GBS: STREP GROUP B AG: NEGATIVE

## 2015-09-03 LAB — TYPE AND SCREEN
ABO/RH(D): B POS
ANTIBODY SCREEN: NEGATIVE

## 2015-09-03 LAB — WET PREP, GENITAL
Clue Cells Wet Prep HPF POC: NONE SEEN
SPERM: NONE SEEN
Trich, Wet Prep: NONE SEEN
Yeast Wet Prep HPF POC: NONE SEEN

## 2015-09-03 LAB — AMNISURE RUPTURE OF MEMBRANE (ROM) NOT AT ARMC: AMNISURE: POSITIVE

## 2015-09-03 LAB — ABO/RH: ABO/RH(D): B POS

## 2015-09-03 MED ORDER — AMOXICILLIN 500 MG PO CAPS
500.0000 mg | ORAL_CAPSULE | Freq: Three times a day (TID) | ORAL | Status: AC
  Administered 2015-09-05 – 2015-09-10 (×15): 500 mg via ORAL
  Filled 2015-09-03 (×15): qty 1

## 2015-09-03 MED ORDER — AZITHROMYCIN 250 MG PO TABS
500.0000 mg | ORAL_TABLET | Freq: Every day | ORAL | Status: AC
  Administered 2015-09-05 – 2015-09-09 (×5): 500 mg via ORAL
  Filled 2015-09-03 (×5): qty 2

## 2015-09-03 MED ORDER — BETAMETHASONE SOD PHOS & ACET 6 (3-3) MG/ML IJ SUSP
12.0000 mg | INTRAMUSCULAR | Status: AC
  Administered 2015-09-04: 12 mg via INTRAMUSCULAR
  Filled 2015-09-03 (×2): qty 2

## 2015-09-03 MED ORDER — DOCUSATE SODIUM 100 MG PO CAPS
100.0000 mg | ORAL_CAPSULE | Freq: Every day | ORAL | Status: DC
  Administered 2015-09-03 – 2015-09-14 (×12): 100 mg via ORAL
  Filled 2015-09-03 (×13): qty 1

## 2015-09-03 MED ORDER — INSULIN GLARGINE 100 UNIT/ML ~~LOC~~ SOLN
60.0000 [IU] | Freq: Every day | SUBCUTANEOUS | Status: DC
  Administered 2015-09-03: 60 [IU] via SUBCUTANEOUS
  Filled 2015-09-03: qty 0.6

## 2015-09-03 MED ORDER — SODIUM CHLORIDE 0.9 % IV SOLN
2.0000 g | Freq: Four times a day (QID) | INTRAVENOUS | Status: AC
  Administered 2015-09-03 – 2015-09-05 (×8): 2 g via INTRAVENOUS
  Filled 2015-09-03 (×8): qty 2000

## 2015-09-03 MED ORDER — ZOLPIDEM TARTRATE 5 MG PO TABS: 5.0000 mg | ORAL_TABLET | Freq: Every evening | ORAL | Status: DC | PRN

## 2015-09-03 MED ORDER — CALCIUM CARBONATE ANTACID 500 MG PO CHEW: 2.0000 | CHEWABLE_TABLET | ORAL | Status: DC | PRN

## 2015-09-03 MED ORDER — PRENATAL MULTIVITAMIN CH
1.0000 | ORAL_TABLET | Freq: Every day | ORAL | Status: DC
  Administered 2015-09-03 – 2015-09-14 (×13): 1 via ORAL
  Filled 2015-09-03 (×12): qty 1

## 2015-09-03 MED ORDER — LACTATED RINGERS IV SOLN
INTRAVENOUS | Status: DC
  Administered 2015-09-03 – 2015-09-08 (×13): via INTRAVENOUS

## 2015-09-03 MED ORDER — ACETAMINOPHEN 325 MG PO TABS
650.0000 mg | ORAL_TABLET | ORAL | Status: DC | PRN
  Administered 2015-09-06 – 2015-09-07 (×2): 650 mg via ORAL
  Filled 2015-09-03 (×3): qty 2

## 2015-09-03 MED ORDER — BETAMETHASONE SOD PHOS & ACET 6 (3-3) MG/ML IJ SUSP
12.0000 mg | Freq: Once | INTRAMUSCULAR | Status: AC
  Administered 2015-09-03: 12 mg via INTRAMUSCULAR
  Filled 2015-09-03: qty 2

## 2015-09-03 MED ORDER — DEXTROSE 5 % IV SOLN
500.0000 mg | INTRAVENOUS | Status: AC
  Administered 2015-09-03 – 2015-09-04 (×2): 500 mg via INTRAVENOUS
  Filled 2015-09-03 (×2): qty 500

## 2015-09-03 NOTE — MAU Provider Note (Signed)
Chief Complaint:  Rupture of Membranes   First Provider Initiated Contact with Patient 09/03/15 0830     HPI: Andrea [name] is a 25 y.o. year old G2P0000 female at 65w2dweeks gestation who presents to MAU reporting leaking of clear fluid since 1300 yesterday 09/02/15. Was examined in clinic, but testing was neg for ROM. Continued to leak all night long. Denies contractions, fever, chills, abd pain, vaginal discharge. Fluid has looked link at times, but  No frank vaginal bleeding. Good fetal movement.   Pregnancy Course: Type 2 DM on Lantus. Poor control.   Past Medical History: Past Medical History  Diagnosis Date  . Diabetes mellitus     type 2 on insulin  . Chicken pox   . Anemia     as a teenager    Past obstetric history: OB History  Gravida Para Term Preterm AB SAB TAB Ectopic Multiple Living  '1 0 0 0 0 0 0 0 0 0 '    # Outcome Date GA Lbr Len/2nd Weight Sex Delivery Anes PTL Lv  1 Current               Past Surgical History: Past Surgical History  Procedure Laterality Date  . No past surgeries    . Foreign body removal Left 08/28/2015    Procedure: REMOVAL FOREIGN BODY foot;  Surgeon: SVickey Huger MD;  [facility]  Service: Orthopedics;  Laterality: Left;  regional block     Family History: Family History  Problem Relation Age of Onset  . Anesthesia problems Neg Hx   . Stroke Father   . Hypertension Father   . Diabetes Mellitus II Father   . Irregular heart beat Father   . Heart disease Father   . Hyperlipidemia Father   . Diabetes Mellitus II Mother   . Hyperlipidemia Mother   . Thyroid disease Mother   . Hypertension Maternal Aunt   . Thyroid disease Maternal Aunt   . Diabetes Mellitus II Paternal Grandmother     Social History: Social History  Substance Use Topics  . Smoking status: Never Smoker   . Smokeless tobacco: Never Used  . Alcohol Use: Yes     Comment: occasionally - none while pregnant    Allergies: No Known Allergies  Meds:   Prescriptions prior to admission  Medication Sig Dispense Refill Last Dose  . aspirin EC 81 MG tablet Take 1 tablet (81 mg total) by mouth daily. Take after 12 weeks for prevention of preeclampsia later in pregnancy 300 tablet 2 09/02/2015 at Unknown time  . Blood Glucose Monitoring Suppl ([place] W/DEVICE KIT Use as advised 1 each 0 09/02/2015 at Unknown time  . cephALEXin (KEFLEX) 500 MG capsule Take 1 capsule (500 mg total) by mouth 4 (four) times daily. 20 capsule 0 Past Week at Unknown time  . glucose blood test strip Use as instructed 100 each 12 09/02/2015 at Unknown time  . insulin glargine (LANTUS) 100 UNIT/ML injection Inject 60 units at bedtime - Solostar pens 15 mL 11 09/02/2015 at Unknown time  . Lancets (ONETOUCH ULTRASOFT) lancets Use as instructed 100 each 12 09/02/2015 at Unknown time  . ONE TOUCH LANCETS MISC Check sugars 3x a day 200 each 11 09/02/2015 at Unknown time  . Prenatal Multivit-Min-Fe-FA (PRENATAL VITAMINS) 0.8 MG tablet Take 1 tablet by mouth daily. 30 tablet 12 09/02/2015 at Unknown time    I have reviewed patient's Past Medical Hx, Surgical Hx, Family Hx, Social Hx, medications  and allergies.   ROS:  Review of Systems  Constitutional: Negative for fever and chills.  Gastrointestinal: Positive for rectal pain (slight rectal pressure). Negative for abdominal pain.  Genitourinary: Positive for vaginal bleeding. Negative for dysuria.       Pos for LOF  Musculoskeletal: Negative for back pain.    Physical Exam  Patient Vitals for the past 24 hrs:  BP Temp Temp src Pulse Resp Height Weight  09/03/15 0745 113/75 mmHg 98.4 F (36.9 C) Oral 101 18 - -  09/03/15 0737 - - - - - '5\' 2"'  (1.575 m) 248 lb 9.6 oz (112.764 kg)   Constitutional: Well-developed, well-nourished female in no acute distress.  Cardiovascular: normal rate Respiratory: normal effort GI: Abd soft, non-tender, gravid appropriate for gestational age. MS: Extremities nontender, no  edema, normal ROM Neurologic: Alert and oriented x 4.  GU: Neg CVAT.  Sterile Spec Exam: NEFG, pooling small amoutn of clear fluid. No blood, cervix clean, visually closed.  Digital exam deferred     FHT:  Baseline 140 , moderate variability, accelerations present, no decelerations Contractions: UI   Labs: Results for orders placed or performed during the hospital encounter of 09/03/15 (from the past 24 hour(s))  Urinalysis, Routine w reflex microscopic (not at [facility])     Status: Abnormal   Collection Time: 09/03/15  7:38 AM  Result Value Ref Range   Color, Urine YELLOW YELLOW   APPearance CLEAR CLEAR   Specific Gravity, Urine 1.020 1.005 - 1.030   pH 6.0 5.0 - 8.0   Glucose, UA NEGATIVE NEGATIVE mg/dL   Hgb urine dipstick NEGATIVE NEGATIVE   Bilirubin Urine NEGATIVE NEGATIVE   Ketones, ur NEGATIVE NEGATIVE mg/dL   Protein, ur NEGATIVE NEGATIVE mg/dL   Nitrite NEGATIVE NEGATIVE   Leukocytes, UA TRACE (A) NEGATIVE  Urine microscopic-add on     Status: Abnormal   Collection Time: 09/03/15  7:38 AM  Result Value Ref Range   Squamous Epithelial / LPF 0-5 (A) NONE SEEN   WBC, UA 0-5 0 - 5 WBC/hpf   RBC / HPF 0-5 0 - 5 RBC/hpf   Bacteria, UA FEW (A) NONE SEEN   Urine-Other MUCOUS PRESENT   Amnisure rupture of membrane (rom)not at [facility]     Status: None   Collection Time: 09/03/15  7:58 AM  Result Value Ref Range   Amnisure ROM POSITIVE    Fern Pos  Imaging:  NA  MAU Course: Amnisure, fern, GC/Chlamydia, Wet prep, GBS.  MDM: -  32.2 PPROM x 20 hours. No evidence of active labor or [place]  Assessment: 1. Preterm premature rupture of membranes in third trimester, unspecified duration to onset of labor     Plan: Admit to Ante BMZ Latency ABX Cultures Pending NICU consult  Manya Silvas, CNM 09/03/2015 9:01 AM

## 2015-09-03 NOTE — MAU Note (Signed)
Pt started leaking around 1300 yesterday.  She went to her appointment at 1400 and they did a speculum exam and said she was not ruptured.  Pt states she went home and has been leaking ever since.  Pt states she has been constantly changing pads and her pants.

## 2015-09-03 NOTE — H&P (Signed)
ANTEPARTUM ADMISSION HISTORY AND PHYSICAL NOTE   History of Present Illness: Andrea [name] is a 25 y.o. G1P0000 at 36w2dpresenting for leakage of fluid. Began yesterday at 1 pm. No fever. No contractions or bleeding.    Patient Active Problem List   Diagnosis Date Noted  . Preterm premature rupture of membranes (PPROM) with unknown onset of labor 09/03/2015  . Obesity affecting pregnancy, antepartum 04/16/2015  . Preexisting diabetes complicating pregnancy, antepartum 04/15/2015  . Supervision of high risk pregnancy, antepartum 04/15/2015  . Diabetes mellitus type 2, uncontrolled, without complications ([place] [id number]/83/2549   Past Medical History  Diagnosis Date  . Diabetes mellitus     type 2 on insulin  . Chicken pox   . Anemia     as a teenager    Past Surgical History  Procedure Laterality Date  . No past surgeries    . Foreign body removal Left 08/28/2015    Procedure: REMOVAL FOREIGN BODY foot;  Surgeon: SVickey Huger MD;  [facility]  Service: Orthopedics;  Laterality: Left;  regional block    OB History  Gravida Para Term Preterm AB SAB TAB Ectopic Multiple Living  1 0 0 0 0 0 0 0 0 0    # Outcome Date GA Lbr Len/2nd Weight Sex Delivery Anes PTL Lv  1 Current               Social History   Social History  . Marital Status: Legally Separated    Spouse Name: N/A  . Number of Children: N/A  . Years of Education: N/A   Social History Main Topics  . Smoking status: Never Smoker   . Smokeless tobacco: Never Used  . Alcohol Use: Yes     Comment: occasionally - none while pregnant  . Drug Use: No  . Sexual Activity: Yes    Birth Control/ Protection: None   Other Topics Concern  . None   Social History Narrative    Family History  Problem Relation Age of Onset  . Anesthesia problems Neg Hx   . Stroke Father   . Hypertension Father   . Diabetes Mellitus II Father   . Irregular heart beat Father   . Heart disease Father   . Hyperlipidemia Father    . Diabetes Mellitus II Mother   . Hyperlipidemia Mother   . Thyroid disease Mother   . Hypertension Maternal Aunt   . Thyroid disease Maternal Aunt   . Diabetes Mellitus II Paternal Grandmother     No Known Allergies  Prescriptions prior to admission  Medication Sig Dispense Refill Last Dose  . aspirin EC 81 MG tablet Take 1 tablet (81 mg total) by mouth daily. Take after 12 weeks for prevention of preeclampsia later in pregnancy 300 tablet 2 09/02/2015 at Unknown time  . Blood Glucose Monitoring Suppl ([place] W/DEVICE KIT Use as advised 1 each 0 09/02/2015 at Unknown time  . cephALEXin (KEFLEX) 500 MG capsule Take 1 capsule (500 mg total) by mouth 4 (four) times daily. 20 capsule 0 Past Week at Unknown time  . glucose blood test strip Use as instructed 100 each 12 09/02/2015 at Unknown time  . insulin glargine (LANTUS) 100 UNIT/ML injection Inject 60 units at bedtime - Solostar pens 15 mL 11 09/02/2015 at Unknown time  . Lancets (ONETOUCH ULTRASOFT) lancets Use as instructed 100 each 12 09/02/2015 at Unknown time  . ONE TOUCH LANCETS MISC Check sugars 3x a day 200 each  11 09/02/2015 at Unknown time  . Prenatal Multivit-Min-Fe-FA (PRENATAL VITAMINS) 0.8 MG tablet Take 1 tablet by mouth daily. 30 tablet 12 09/02/2015 at Unknown time    Review of Systems - Negative except  as per hpi  Vitals:  BP 113/75 mmHg  Pulse 101  Temp(Src) 98.4 F (36.9 C) (Oral)  Resp 18  Ht 5' 2" (1.575 m)  Wt 248 lb 9.6 oz (112.764 kg)  BMI 45.46 kg/m2  LMP 01/20/2015 (Exact Date) Physical Examination: CONSTITUTIONAL: Well-developed, well-nourished female in no acute distress.  HENT:  Normocephalic, atraumatic, External right and left ear normal. Oropharynx is clear and moist EYES: Conjunctivae and EOM are normal. Pupils are equal, round, and reactive to light. No scleral icterus.  NECK: Normal range of motion, supple, no masses SKIN: Skin is warm and dry. No rash noted. Not diaphoretic. No  erythema. No pallor. [place]: Alert and oriented to person, place, and time. Normal reflexes, muscle tone coordination. No cranial nerve deficit noted. PSYCHIATRIC: Normal mood and affect. Normal behavior. Normal judgment and thought content. CARDIOVASCULAR: Normal heart rate noted, regular rhythm RESPIRATORY: Effort and breath sounds normal, no problems with respiration noted ABDOMEN: Soft, nontender, nondistended, gravid. MUSCULOSKELETAL: Normal range of motion. No edema and no tenderness. 2+ distal pulses. GU: SSE by CNM cervix visually closed  Fetal Monitoring: 135/mod/+a/-d Tocometer: Flat  Vertex by bedside u/s  Labs:  Results for orders placed or performed during the hospital encounter of 09/03/15 (from the past 24 hour(s))  Urinalysis, Routine w reflex microscopic (not at [facility])   Collection Time: 09/03/15  7:38 AM  Result Value Ref Range   Color, Urine YELLOW YELLOW   APPearance CLEAR CLEAR   Specific Gravity, Urine 1.020 1.005 - 1.030   pH 6.0 5.0 - 8.0   Glucose, UA NEGATIVE NEGATIVE mg/dL   Hgb urine dipstick NEGATIVE NEGATIVE   Bilirubin Urine NEGATIVE NEGATIVE   Ketones, ur NEGATIVE NEGATIVE mg/dL   Protein, ur NEGATIVE NEGATIVE mg/dL   Nitrite NEGATIVE NEGATIVE   Leukocytes, UA TRACE (A) NEGATIVE  Urine microscopic-add on   Collection Time: 09/03/15  7:38 AM  Result Value Ref Range   Squamous Epithelial / LPF 0-5 (A) NONE SEEN   WBC, UA 0-5 0 - 5 WBC/hpf   RBC / HPF 0-5 0 - 5 RBC/hpf   Bacteria, UA FEW (A) NONE SEEN   Urine-Other MUCOUS PRESENT   Amnisure rupture of membrane (rom)not at [facility]   Collection Time: 09/03/15  7:58 AM  Result Value Ref Range   Amnisure ROM POSITIVE   Wet prep, genital   Collection Time: 09/03/15  8:47 AM  Result Value Ref Range   Yeast Wet Prep HPF POC NONE SEEN NONE SEEN   Trich, Wet Prep NONE SEEN NONE SEEN   Clue Cells Wet Prep HPF POC NONE SEEN NONE SEEN   WBC, Wet Prep HPF POC MANY (A) NONE SEEN   Sperm NONE SEEN   Type  and screen [place]   Collection Time: 09/03/15  9:55 AM  Result Value Ref Range   ABO/RH(D) B POS    Antibody Screen NEG    Sample Expiration 09/06/2015   Results for orders placed or performed in visit on 09/02/15 (from the past 24 hour(s))  POCT urinalysis dip (device)   Collection Time: 09/02/15  2:52 PM  Result Value Ref Range   Glucose, UA NEGATIVE NEGATIVE mg/dL   Bilirubin Urine NEGATIVE NEGATIVE   Ketones, ur NEGATIVE NEGATIVE mg/dL   Specific  Gravity, Urine 1.020 1.005 - 1.030   Hgb urine dipstick NEGATIVE NEGATIVE   pH 5.5 5.0 - 8.0   Protein, ur NEGATIVE NEGATIVE mg/dL   Urobilinogen, UA 0.2 0.0 - 1.0 mg/dL   Nitrite NEGATIVE NEGATIVE   Leukocytes, UA TRACE (A) NEGATIVE    Imaging Studies: Dg Foot Complete Left  08/23/2015  CLINICAL DATA:  Stepped on glass. Pain and swelling at the third toe. EXAM: LEFT FOOT - COMPLETE 3+ VIEW COMPARISON:  11/15/2011 FINDINGS: Negative for fracture or dislocation. There is a linear radiopaque structure along the plantar aspect of the forefoot, seen on the lateral view. This is concerning for a radiopaque foreign body. This structure roughly measures 1.5 cm. This structure is in the region of the proximal phalanges. IMPRESSION: Evidence for a linear foreign body along the plantar aspect of the forefoot. No acute bone abnormality. Electronically Signed   By: Markus Daft M.D.   On: 08/23/2015 11:51   [place] [facility] Ob Follow Up  08/21/2015  OBSTETRICAL ULTRASOUND: This exam was performed within a [facility]. The OB US report was generated in the AS system, and faxed to the ordering physician.  This report is available in the [employer]. See the AS Obstetric US report via the Image Link.    Assessment and Plan: Patient Active Problem List   Diagnosis Date Noted  . Preterm premature rupture of membranes (PPROM) with unknown onset of labor 09/03/2015  . Obesity affecting pregnancy, antepartum 04/16/2015   . Preexisting diabetes complicating pregnancy, antepartum 04/15/2015  . Supervision of high risk pregnancy, antepartum 04/15/2015  . Diabetes mellitus type 2, uncontrolled, without complications ([place]) 94/76/5465   # PPROM - no signs infection, not in labor, nst reactive - amp/azith - bmz today and again in 24 hours - bid nst - wet prep and ua unremarkable - f/u g/c and GBS  # BDM - on lantus as short-acting insulin caused frequent hypoglycemia. A1c 5.8% last month - continue lantus 60 units qhs - fasting and 2-hour PP CBG  Admit to Antenatal Routine antenatal care  Desma Maxim, MD obgyn fellow Faculty Practice, [facility]

## 2015-09-03 NOTE — Progress Notes (Signed)
Dr. Alvester MorinNewton notified of pt in MAU.  Notified that pt is a G1P0 at 6640w2d.  Notified that pt has Type 2 Diabetes.  Notified that pt complains of leaking of fluid since 1300 yesterday.  Notified that pt states she had a doctor's appointment at 1400 yesterday and they ruled out SROM but ever since she has been leaking fluid and changing pads and clothes.  Provider states to collect an amnisure.

## 2015-09-04 DIAGNOSIS — O42919 Preterm premature rupture of membranes, unspecified as to length of time between rupture and onset of labor, unspecified trimester: Secondary | ICD-10-CM

## 2015-09-04 DIAGNOSIS — E1165 Type 2 diabetes mellitus with hyperglycemia: Secondary | ICD-10-CM

## 2015-09-04 DIAGNOSIS — Z794 Long term (current) use of insulin: Secondary | ICD-10-CM

## 2015-09-04 LAB — GLUCOSE, CAPILLARY
GLUCOSE-CAPILLARY: 174 mg/dL — AB (ref 65–99)
GLUCOSE-CAPILLARY: 176 mg/dL — AB (ref 65–99)
Glucose-Capillary: 139 mg/dL — ABNORMAL HIGH (ref 65–99)
Glucose-Capillary: 165 mg/dL — ABNORMAL HIGH (ref 65–99)
Glucose-Capillary: 127 mg/dL — ABNORMAL HIGH (ref 65–99)

## 2015-09-04 LAB — GC/CHLAMYDIA PROBE AMP (~~LOC~~) NOT AT ARMC
CHLAMYDIA, DNA PROBE: NEGATIVE
Neisseria Gonorrhea: NEGATIVE

## 2015-09-04 MED ORDER — INSULIN NPH (HUMAN) (ISOPHANE) 100 UNIT/ML ~~LOC~~ SUSP
32.0000 [IU] | Freq: Two times a day (BID) | SUBCUTANEOUS | Status: DC
  Administered 2015-09-04 – 2015-09-06 (×4): 32 [IU] via SUBCUTANEOUS

## 2015-09-04 MED ORDER — INSULIN NPH (HUMAN) (ISOPHANE) 100 UNIT/ML ~~LOC~~ SUSP
30.0000 [IU] | Freq: Two times a day (BID) | SUBCUTANEOUS | Status: DC
  Administered 2015-09-04: 30 [IU] via SUBCUTANEOUS
  Filled 2015-09-04: qty 10

## 2015-09-04 MED ORDER — INSULIN ASPART 100 UNIT/ML ~~LOC~~ SOLN
10.0000 [IU] | Freq: Three times a day (TID) | SUBCUTANEOUS | Status: DC
  Administered 2015-09-04 – 2015-09-07 (×8): 10 [IU] via SUBCUTANEOUS

## 2015-09-04 NOTE — Progress Notes (Signed)
Patient ID: Andrea [name], female   DOB: Mar 20, 1991, 25 y.o.   MRN: [id number] FACULTY PRACTICE ANTEPARTUM(COMPREHENSIVE) NOTE  Andrea [name] is a 25 y.o. G1P0000 at [redacted]w[redacted]d by early ultrasound who is admitted for PROM.   Fetal presentation is cephalic. Length of Stay:  1  Days  Subjective: Still leaking.  BS have gotten more elevated. Patient reports the fetal movement as active. Patient reports uterine contraction  activity as none. Patient reports  vaginal bleeding as none. Patient describes fluid per vagina as Clear.  Vitals:  Blood pressure 127/62, pulse 106, temperature 98.6 F (37 C), temperature source Oral, resp. rate 16, height  (1.575 m), weight 248 lb 9.6 oz (112.764 kg), last menstrual period 01/20/2015. Physical Examination:  General appearance - alert, well appearing, and in no distress Chest - normal effort Abdomen - gravid, NT Fundal Height:  size equals dates Extremities: Homans sign is negative, no sign of DVT  Membranes:ruptured, clear fluid  Fetal Monitoring:  Baseline: 145 bpm, Variability: Good {> 6 bpm), Accelerations: Reactive and Decelerations: Absent  Labs:  Results for orders placed or performed during the hospital encounter of 09/03/15 (from the past 24 hour(s))  Urinalysis, Routine w reflex microscopic (not at [facility])   Collection Time: 09/03/15  7:38 AM  Result Value Ref Range   Color, Urine YELLOW YELLOW   APPearance CLEAR CLEAR   Specific Gravity, Urine 1.020 1.005 - 1.030   pH 6.0 5.0 - 8.0   Glucose, UA NEGATIVE NEGATIVE mg/dL   Hgb urine dipstick NEGATIVE NEGATIVE   Bilirubin Urine NEGATIVE NEGATIVE   Ketones, ur NEGATIVE NEGATIVE mg/dL   Protein, ur NEGATIVE NEGATIVE mg/dL   Nitrite NEGATIVE NEGATIVE   Leukocytes, UA TRACE (A) NEGATIVE  Urine microscopic-add on   Collection Time: 09/03/15  7:38 AM  Result Value Ref Range   Squamous Epithelial / LPF 0-5 (A) NONE SEEN   WBC, UA 0-5 0 - 5 WBC/hpf   RBC / HPF 0-5 0 - 5 RBC/hpf   Bacteria, UA FEW (A) NONE SEEN   Urine-Other MUCOUS PRESENT   Amnisure rupture of membrane (rom)not at [facility]   Collection Time: 09/03/15  7:58 AM  Result Value Ref Range   Amnisure ROM POSITIVE   Wet prep, genital   Collection Time: 09/03/15  8:47 AM  Result Value Ref Range   Yeast Wet Prep HPF POC NONE SEEN NONE SEEN   Trich, Wet Prep NONE SEEN NONE SEEN   Clue Cells Wet Prep HPF POC NONE SEEN NONE SEEN   WBC, Wet Prep HPF POC MANY (A) NONE SEEN   Sperm NONE SEEN   Type and screen [facility]    Collection Time: 09/03/15  9:55 AM  Result Value Ref Range   ABO/RH(D) B POS    Antibody Screen NEG    Sample Expiration 09/06/2015   ABO/Rh   Collection Time: 09/03/15  9:55 AM  Result Value Ref Range   ABO/RH(D) B POS   Glucose, capillary   Collection Time: 09/03/15  2:26 PM  Result Value Ref Range   Glucose-Capillary 136 (H) 65 - 99 mg/dL  Glucose, capillary   Collection Time: 09/03/15  9:14 PM  Result Value Ref Range   Glucose-Capillary 254 (H) 65 - 99 mg/dL       Medications:  Scheduled . ampicillin (OMNIPEN) IV  2 g Intravenous Q6H   Followed by  . [START ON 09/05/2015] amoxicillin  500 mg Oral Q8H  . azithromycin  500 mg  Intravenous Q24H   Followed by  . [START ON 09/05/2015] azithromycin  500 mg Oral Daily  . betamethasone acetate-betamethasone sodium phosphate  12 mg Intramuscular Q24H  . docusate sodium  100 mg Oral Daily  . insulin glargine  60 Units Subcutaneous QHS  . prenatal multivitamin  1 tablet Oral Q1200   I have reviewed the patient's current medications.  ASSESSMENT: Active Problems:   Diabetes mellitus type 2, uncontrolled, without complications (HCC)   Preterm premature rupture of membranes (PPROM) with unknown onset of labor   PLAN: Continue latency abx.  Complete BMZ today Delivery with s/sx's chorio Given elevated BS--related to BMZ--switch to NPH and Novolog  Aldous Housel S, MD 09/04/2015,7:15 AM

## 2015-09-04 NOTE — Progress Notes (Signed)
Inpatient Diabetes Program Recommendations  AACE/ADA: New Consensus Statement on Inpatient Glycemic Control (2015)  Target Ranges:  Prepandial:   less than 140 mg/dL      Peak postprandial:   less than 180 mg/dL (1-2 hours)      Critically ill patients:  140 - 180 mg/dL   Review of Glycemic Control  Diabetes history: Pre-existing  Dm 2 Outpatient Diabetes medications: Lantus 60 units at HS Current orders for Inpatient glycemic control: NPH 30 units am and HS and novolog 10 units tidwc.  Inpatient Diabetes Program Recommendations:    First dose solumedrol given yesterday.  Noted regular diet ordered - changed to GDM carbohydrate modified With co-sign required.  Glad to assist with any concerns or needs.  Thank you Lenor CoffinAnn Tylah Mancillas, RN, MSN, CDE  Diabetes Inpatient Program Office: [phone] Pager: [phone] 8:00 am to 5:00 pm

## 2015-09-04 NOTE — Progress Notes (Signed)
Initial Nutrition Assessment  DOCUMENTATION CODES:  Obesity unspecified  INTERVENTION:  Carbohydrate modified gestational diet, double protein portions allowed NUTRITION DIAGNOSIS:   Increased nutrient needs related to  (pregnancy and fetal growth requirements) as evidenced by  (32 weeks IUP).  GOAL:  Patient will meet greater than or equal to 90% of their needs  MONITOR:  Labs  REASON FOR ASSESSMENT:  Antenatal, Gestational Diabetes   ASSESSMENT:   32 3/7 weeks IUP, PROM, Hx of DM II. pre-preg weight approx 218 lbs, BMI 39.9. 30 lb weight gain. Diet tol well  Diet Order:  Diet gestational carb mod Room service appropriate?: Yes; Fluid consistency:: Thin  Skin:  Reviewed, no issues  Height:   Ht Readings from Last 1 Encounters:  09/03/15 5\' 2"  (1.575 m)    Weight:   Wt Readings from Last 1 Encounters:  09/04/15 250 lb 3.2 oz (113.49 kg)   Ideal Body Weight:  50 kg  BMI:  Body mass index is 45.75 kg/(m^2).  Estimated Nutritional Needs:   Kcal:  2100-2300  Protein:  95-105 g  Fluid:  2.4 L  EDUCATION NEEDS:   No education needs identified at this time [facility]  Elisabeth CaraKatherine Jaedin Regina M.Odis LusterEd. R.D. LDN Neonatal Nutrition Support Specialist/RD III Pager [phone]      Phone [phone]

## 2015-09-05 ENCOUNTER — Other Ambulatory Visit: Payer: Medicaid Other

## 2015-09-05 DIAGNOSIS — E119 Type 2 diabetes mellitus without complications: Secondary | ICD-10-CM

## 2015-09-05 DIAGNOSIS — Z794 Long term (current) use of insulin: Secondary | ICD-10-CM

## 2015-09-05 DIAGNOSIS — O42113 Preterm premature rupture of membranes, onset of labor more than 24 hours following rupture, third trimester: Secondary | ICD-10-CM

## 2015-09-05 DIAGNOSIS — O24113 Pre-existing diabetes mellitus, type 2, in pregnancy, third trimester: Secondary | ICD-10-CM

## 2015-09-05 DIAGNOSIS — Z3A32 32 weeks gestation of pregnancy: Secondary | ICD-10-CM

## 2015-09-05 LAB — GLUCOSE, CAPILLARY
GLUCOSE-CAPILLARY: 67 mg/dL (ref 65–99)
GLUCOSE-CAPILLARY: 151 mg/dL — AB (ref 65–99)
Glucose-Capillary: 76 mg/dL (ref 65–99)
Glucose-Capillary: 84 mg/dL (ref 65–99)
Glucose-Capillary: 69 mg/dL (ref 65–99)

## 2015-09-05 LAB — CULTURE, BETA STREP (GROUP B ONLY): Special Requests: NORMAL

## 2015-09-05 NOTE — Progress Notes (Addendum)
Patient ID: Andrea [name], female   DOB: 01-07-1991, [age] y.o.   MRN: [id number] Patient ID: Andrea [name], female   DOB: 01-07-1991, 25 y.o.   MRN: [id number] FACULTY PRACTICE ANTEPARTUM(COMPREHENSIVE) NOTE  Andrea [name] is a 25 y.o. G1P0000 at 7812w4d by early ultrasound who is admitted for PROM.   Fetal presentation is cephalic. Length of Stay:  2  Days  Subjective: Still leaking.   Patient reports the fetal movement as active. Patient reports uterine contraction  activity as none. Patient reports  vaginal bleeding as none. Patient describes fluid per vagina as Clear.  Vitals:  Blood pressure 130/71, pulse 85, temperature 98.2 F (36.8 C), temperature source Oral, resp. rate 18, height 5\' 2"  (1.575 m), weight 250 lb 3.2 oz (113.49 kg), last menstrual period 01/20/2015. Physical Examination:  General appearance - alert, well appearing, and in no distress Chest - normal effort Abdomen - gravid, NT Fundal Height:  size equals dates Extremities: Homans sign is negative, no sign of DVT  Membranes:ruptured, clear fluid  Fetal Monitoring:  Baseline: 145 bpm, Variability: Good {> 6 bpm), Accelerations: Reactive and Decelerations: Absent  Labs:  Results for orders placed or performed during the hospital encounter of 09/03/15 (from the past 24 hour(s))  Glucose, capillary   Collection Time: 09/04/15  7:55 AM  Result Value Ref Range   Glucose-Capillary 139 (H) 65 - 99 mg/dL  Glucose, capillary   Collection Time: 09/04/15 10:16 AM  Result Value Ref Range   Glucose-Capillary 165 (H) 65 - 99 mg/dL  Glucose, capillary   Collection Time: 09/04/15 12:11 PM  Result Value Ref Range   Glucose-Capillary 174 (H) 65 - 99 mg/dL  Glucose, capillary   Collection Time: 09/04/15  2:05 PM  Result Value Ref Range   Glucose-Capillary 127 (H) 65 - 99 mg/dL   Comment 1 Notify RN    Comment 2 Document in Chart   Glucose, capillary   Collection Time: 09/04/15  7:28 PM  Result Value Ref Range   Glucose-Capillary 176 (H) 65 - 99 mg/dL       Medications:  Scheduled . amoxicillin  500 mg Oral Q8H  . azithromycin  500 mg Oral Daily  . betamethasone acetate-betamethasone sodium phosphate  12 mg Intramuscular Q24H  . docusate sodium  100 mg Oral Daily  . insulin aspart  10 Units Subcutaneous TID WC  . insulin NPH Human  32 Units Subcutaneous BID AC & HS  . prenatal multivitamin  1 tablet Oral Q1200   I have reviewed the patient's current medications.  ASSESSMENT: 3112w4d Estimated Date of Delivery: 10/27/15  Active Problems:   Diabetes mellitus type 2, uncontrolled, without complications (HCC)   Preterm premature rupture of membranes (PPROM) with unknown onset of labor   PLAN: Continue latency abx.  Completed BMZ yesterday Delivery with s/sx's chorio Given elevated BS--related to BMZ--switch to NPH and Novolog:  BS are now much improved  Lazaro ArmsEURE,Kensley Valladares H, MD 09/05/2015,7:41 AM

## 2015-09-06 ENCOUNTER — Encounter (HOSPITAL_COMMUNITY): Payer: Self-pay | Admitting: Obstetrics & Gynecology

## 2015-09-06 DIAGNOSIS — Z3A32 32 weeks gestation of pregnancy: Secondary | ICD-10-CM

## 2015-09-06 LAB — GLUCOSE, CAPILLARY
GLUCOSE-CAPILLARY: 89 mg/dL (ref 65–99)
GLUCOSE-CAPILLARY: 88 mg/dL (ref 65–99)
Glucose-Capillary: 52 mg/dL — ABNORMAL LOW (ref 65–99)
Glucose-Capillary: 56 mg/dL — ABNORMAL LOW (ref 65–99)
Glucose-Capillary: 66 mg/dL (ref 65–99)
Glucose-Capillary: 64 mg/dL — ABNORMAL LOW (ref 65–99)
Glucose-Capillary: 93 mg/dL (ref 65–99)
Glucose-Capillary: 103 mg/dL — ABNORMAL HIGH (ref 65–99)

## 2015-09-06 MED ORDER — INSULIN NPH (HUMAN) (ISOPHANE) 100 UNIT/ML ~~LOC~~ SUSP
28.0000 [IU] | Freq: Two times a day (BID) | SUBCUTANEOUS | Status: DC
  Administered 2015-09-06 – 2015-09-07 (×3): 28 [IU] via SUBCUTANEOUS
  Administered 2015-09-08: 25 [IU] via SUBCUTANEOUS

## 2015-09-06 NOTE — Progress Notes (Signed)
Hypoglycemic Event  CBG: 56  Treatment: 15 GM carbohydrate snack  Symptoms: None  Follow-up CBG: Time:0828 CBG Result:52  Possible Reasons for Event: Unknown  Comments/MD notified:Patient didn't drink juice and eat snack she was given. Pt educated and RN watched her drink juice and eat. RN will Recheck CBG in 15 minutes.  Follow-up CBG: [id number]:0849 CBG result:89  Comments/ MD notified: Dr. Alvester MorinNewton notified at 0930 of AM CBG results and that patient is now eating breakfast. Per MD- RN to hold AM Novolog dose and call MD with 2 hour PP CBG result. RN to give morning NPH now.    Pearlie OysterHaynes, Jarrid Lienhard Y

## 2015-09-06 NOTE — Progress Notes (Signed)
FACULTY PRACTICE ANTEPARTUM(COMPREHENSIVE) NOTE  Andrea [name] is a 25 y.o. G1P0000 at 2664w5d by LMP who is admitted for rupture of membranes.   Fetal presentation is cephalic. Length of Stay:  3  Days  Subjective:  Patient reports the fetal movement as active. Patient reports uterine contraction  activity as none. Patient reports  vaginal bleeding as none. Patient describes fluid per vagina as Clear.  Vitals:  Blood pressure 111/82, pulse 90, temperature 98.8 F (37.1 C), temperature source Oral, resp. rate 18, height 5\' 2"  (1.575 m), weight 113.49 kg (250 lb 3.2 oz), last menstrual period 01/20/2015. Physical Examination:  General appearance - alert, well appearing, and in no distress Heart - normal rate and regular rhythm Abdomen - soft, nontender, nondistended Fundal Height:  size equals dates Cervical Exam: Not evaluated.  Extremities: extremities normal, atraumatic, no cyanosis or edema and Homans sign is negative, no sign of DVT  Membranes:ruptured, clear fluid  Fetal Monitoring:     Fetal Heart Rate A      Mode  External filed at 09/05/2015 1815    Baseline Rate (A)  135 bpm filed at 09/05/2015 1815    Variability  6-25 BPM filed at 09/05/2015 1815    Accelerations  10 x 10 filed at 09/05/2015 1815    Decelerations  Variable filed at 09/05/2015 1815      Labs:  Results for orders placed or performed during the hospital encounter of 09/03/15 (from the past 24 hour(s))  Glucose, capillary   Collection Time: 09/05/15  8:08 AM  Result Value Ref Range   Glucose-Capillary 76 65 - 99 mg/dL   Comment 1 Notify RN    Comment 2 Document in Chart   Glucose, capillary   Collection Time: 09/05/15 10:41 AM  Result Value Ref Range   Glucose-Capillary 84 65 - 99 mg/dL   Comment 1 Notify RN    Comment 2 Document in Chart   Glucose, capillary   Collection Time: 09/05/15  1:25 PM  Result Value Ref Range   Glucose-Capillary 69 65 - 99 mg/dL   Comment 1 Notify RN    Comment 2 Document in Chart   Glucose, capillary   Collection Time: 09/05/15  3:29 PM  Result Value Ref Range   Glucose-Capillary 67 65 - 99 mg/dL   Comment 1 Notify RN    Comment 2 Document in Chart   Glucose, capillary   Collection Time: 09/05/15 10:03 PM  Result Value Ref Range   Glucose-Capillary 151 (H) 65 - 99 mg/dL      Medications:  Scheduled . amoxicillin  500 mg Oral Q8H  . azithromycin  500 mg Oral Daily  . docusate sodium  100 mg Oral Daily  . insulin aspart  10 Units Subcutaneous TID WC  . insulin NPH Human  32 Units Subcutaneous BID AC & HS  . prenatal multivitamin  1 tablet Oral Q1200   I have reviewed the patient's current medications.  ASSESSMENT: Patient Active Problem List   Diagnosis Date Noted  . Preterm premature rupture of membranes (PPROM) with unknown onset of labor 09/03/2015  . Obesity affecting pregnancy, antepartum 04/16/2015  . Preexisting diabetes complicating pregnancy, antepartum 04/15/2015  . Supervision of high risk pregnancy, antepartum 04/15/2015  . Diabetes mellitus type 2, uncontrolled, without complications (HCC) 10/31/2012    PLAN: Observation for s/sx PTL, III, fetal status  Nealie Mchatton 09/06/2015,7:03 AM

## 2015-09-06 NOTE — Progress Notes (Signed)
Inpatient Diabetes Program Recommendations  AACE/ADA: New Consensus Statement on Inpatient Glycemic Control (2015)  Target Ranges:  Prepandial:   less than 140 mg/dL      Peak postprandial:   less than 180 mg/dL (1-2 hours)      Critically ill patients:  140 - 180 mg/dL   Review of Glycemic Control  Results for Andrea BrineWHARTON, Apoorva J (MRN [id number]) as of 09/06/2015 15:46  Ref. Range 09/05/2015 15:29 09/05/2015 22:03 09/06/2015 07:53 09/06/2015 08:28 09/06/2015 08:49 09/06/2015 10:48 09/06/2015 12:13 09/06/2015 14:27  Glucose-Capillary Latest Ref Range: 65-99 mg/dL 67 119151 (H) 56 (L) 52 (L) 89 66 64 (L) 93   Hypoglycemia   Inpatient Diabetes Program Recommendations:    Reduce Novolog meal coverage to 6 units tidwc Use Diabetes in Pregnancy Glycemic Control Order Set for Novolog correction tidwc and hs.  Will to continue to follow. Thank you. Ailene Ardshonda Shamell Hittle, RD, LDN, CDE Inpatient Diabetes Coordinator [phone]

## 2015-09-07 LAB — CBC WITH DIFFERENTIAL/PLATELET
BASOS PCT: 0 %
Basophils Absolute: 0 10*3/uL (ref 0.0–0.1)
Eosinophils Absolute: 0.1 10*3/uL (ref 0.0–0.7)
Eosinophils Relative: 1 %
HEMATOCRIT: 29.1 % — AB (ref 36.0–46.0)
HEMOGLOBIN: 9.9 g/dL — AB (ref 12.0–15.0)
Lymphocytes Relative: 25 %
Lymphs Abs: 2 10*3/uL (ref 0.7–4.0)
MCH: 26.7 pg (ref 26.0–34.0)
MCHC: 34 g/dL (ref 30.0–36.0)
MCV: 78.4 fL (ref 78.0–100.0)
MONOS PCT: 7 %
Monocytes Absolute: 0.6 10*3/uL (ref 0.1–1.0)
NEUTROS ABS: 5.3 10*3/uL (ref 1.7–7.7)
NEUTROS PCT: 67 %
Platelets: 270 10*3/uL (ref 150–400)
RBC: 3.71 MIL/uL — AB (ref 3.87–5.11)
RDW: 14.4 % (ref 11.5–15.5)
WBC: 8 10*3/uL (ref 4.0–10.5)

## 2015-09-07 LAB — GLUCOSE, CAPILLARY
GLUCOSE-CAPILLARY: 53 mg/dL — AB (ref 65–99)
GLUCOSE-CAPILLARY: 88 mg/dL (ref 65–99)
GLUCOSE-CAPILLARY: 66 mg/dL (ref 65–99)
GLUCOSE-CAPILLARY: 52 mg/dL — AB (ref 65–99)
GLUCOSE-CAPILLARY: 72 mg/dL (ref 65–99)
Glucose-Capillary: 111 mg/dL — ABNORMAL HIGH (ref 65–99)
Glucose-Capillary: 78 mg/dL (ref 65–99)
Glucose-Capillary: 165 mg/dL — ABNORMAL HIGH (ref 65–99)

## 2015-09-07 MED ORDER — DOCUSATE SODIUM 100 MG PO CAPS
100.0000 mg | ORAL_CAPSULE | Freq: Once | ORAL | Status: AC
Start: 2015-09-07 — End: 2015-09-07
  Administered 2015-09-07: 100 mg via ORAL

## 2015-09-07 NOTE — Progress Notes (Signed)
Insulin held currently, will reassess after meal complete.

## 2015-09-07 NOTE — Progress Notes (Signed)
Patient ID: Andrea [name], female   DOB: 11-11-1990, 25 y.o.   MRN: [id number] FACULTY PRACTICE ANTEPARTUM(COMPREHENSIVE) NOTE  SUA SPADAFORA is a 25 y.o. G1P0000 at [redacted]w[redacted]d by best clinical estimate who is admitted for PROM.   Fetal presentation is cephalic. Length of Stay:  4  Days  Subjective: Had low BS on yesterday. Patient reports the fetal movement as active. Patient reports uterine contraction  activity as none. Patient reports  vaginal bleeding as none. Patient describes fluid per vagina as Clear.  Vitals:  Blood pressure 111/66, pulse 101, temperature 98.5 F (36.9 C), temperature source Oral, resp. rate 18, height  (1.575 m), weight 250 lb 3.2 oz (113.49 kg), last menstrual period 01/20/2015. Physical Examination:  General appearance - alert, well appearing, and in no distress Chest - normal effort Abdomen - gravid, NT Fundal Height:  size equals dates Extremities: Homans sign is negative, no sign of DVT  Membranes:ruptured, clear fluid  Fetal Monitoring:  Baseline: 150 bpm, Variability: Good {> 6 bpm), Accelerations: Reactive and Decelerations: Variable: mild  Labs:  Results for orders placed or performed during the hospital encounter of 09/03/15 (from the past 24 hour(s))  Glucose, capillary   Collection Time: 09/06/15  7:53 AM  Result Value Ref Range   Glucose-Capillary 56 (L) 65 - 99 mg/dL   Comment 1 Notify RN    Comment 2 Document in Chart   Glucose, capillary   Collection Time: 09/06/15  8:28 AM  Result Value Ref Range   Glucose-Capillary 52 (L) 65 - 99 mg/dL   Comment 1 Document in Chart   Glucose, capillary   Collection Time: 09/06/15  8:49 AM  Result Value Ref Range   Glucose-Capillary 89 65 - 99 mg/dL   Comment 1 Notify RN    Comment 2 Document in Chart   Glucose, capillary   Collection Time: 09/06/15 10:48 AM  Result Value Ref Range   Glucose-Capillary 66 65 - 99 mg/dL   Comment 1 Notify RN    Comment 2 Document in Chart   Glucose,  capillary   Collection Time: 09/06/15 12:13 PM  Result Value Ref Range   Glucose-Capillary 64 (L) 65 - 99 mg/dL   Comment 1 Notify RN    Comment 2 Document in Chart   Glucose, capillary   Collection Time: 09/06/15  2:27 PM  Result Value Ref Range   Glucose-Capillary 93 65 - 99 mg/dL   Comment 1 Notify RN    Comment 2 Document in Chart   Glucose, capillary   Collection Time: 09/06/15  6:53 PM  Result Value Ref Range   Glucose-Capillary 103 (H) 65 - 99 mg/dL   Comment 1 Notify RN    Comment 2 Document in Chart   Glucose, capillary   Collection Time: 09/06/15  9:04 PM  Result Value Ref Range   Glucose-Capillary 88 65 - 99 mg/dL     Medications:  Scheduled . amoxicillin  500 mg Oral Q8H  . azithromycin  500 mg Oral Daily  . docusate sodium  100 mg Oral Daily  . insulin aspart  10 Units Subcutaneous TID WC  . insulin NPH Human  28 Units Subcutaneous BID AC & HS  . prenatal multivitamin  1 tablet Oral Q1200   I have reviewed the patient's current medications.  ASSESSMENT: Active Problems:   Diabetes mellitus type 2, uncontrolled, without complications (HCC)   Preterm premature rupture of membranes (PPROM) with unknown onset of labor   PLAN: Stop meal coverage  and return to long acting insulin Continue inpatient management of PPROM Continue latency Abx Delivery with s/sx's chorio or at 34 wks  Herman Fiero S, MD 09/07/2015,7:28 AM

## 2015-09-08 DIAGNOSIS — O42913 Preterm premature rupture of membranes, unspecified as to length of time between rupture and onset of labor, third trimester: Secondary | ICD-10-CM | POA: Diagnosis not present

## 2015-09-08 LAB — GLUCOSE, CAPILLARY
GLUCOSE-CAPILLARY: 62 mg/dL — AB (ref 65–99)
GLUCOSE-CAPILLARY: 67 mg/dL (ref 65–99)
GLUCOSE-CAPILLARY: 80 mg/dL (ref 65–99)
GLUCOSE-CAPILLARY: 122 mg/dL — AB (ref 65–99)
GLUCOSE-CAPILLARY: 115 mg/dL — AB (ref 65–99)
Glucose-Capillary: 82 mg/dL (ref 65–99)

## 2015-09-08 MED ORDER — INSULIN ASPART 100 UNIT/ML ~~LOC~~ SOLN
10.0000 [IU] | Freq: Every day | SUBCUTANEOUS | Status: DC
Start: 2015-09-08 — End: 2015-09-10
  Administered 2015-09-08: 10 [IU] via SUBCUTANEOUS

## 2015-09-08 MED ORDER — INSULIN NPH (HUMAN) (ISOPHANE) 100 UNIT/ML ~~LOC~~ SUSP
25.0000 [IU] | Freq: Two times a day (BID) | SUBCUTANEOUS | Status: DC
  Administered 2015-09-08 – 2015-09-09 (×2): 25 [IU] via SUBCUTANEOUS

## 2015-09-08 NOTE — Progress Notes (Signed)
Snack provided, pt denies s/s of hypoglycemia. Call bell within reach

## 2015-09-08 NOTE — Progress Notes (Signed)
Patient ID: Minette BrineOlivia J Wharton, female   DOB: [birth year], 25 y.o.   MRN: [id number] FACULTY PRACTICE ANTEPARTUM(COMPREHENSIVE) NOTE  Minette BrineOlivia J Wharton is a 25 y.o. G1P0000 at 258w6d by best clinical estimate who is admitted for PROM.   Fetal presentation is cephalic. Length of Stay:  5  Days  Subjective: Patient doing well.  Did have a low blood sugar yesterday afternoon, yesterday evening CBG was elevated.  Received premeal insulin yesterday.   Patient reports the fetal movement as active. Patient reports uterine contraction  activity as none. Patient reports  vaginal bleeding as none. Patient describes fluid per vagina as Clear.  Vitals:  Blood pressure 124/63, pulse 83, temperature 98 F (36.7 C), temperature source Oral, resp. rate 18, height 5\' 2"  (1.575 m), weight 250 lb 3.2 oz (113.49 kg), last menstrual period 01/20/2015. Physical Examination:  General appearance - alert, well appearing, and in no distress Heart: regular rate, no murmur Lungs: clear to auscultation bilaterally, no wheezing. Abdomen - gravid, NT Fundal Height:  size equals dates Extremities: Homans sign is negative, no sign of DVT  Membranes:ruptured, clear fluid  Fetal Monitoring:  Baseline: 150 bpm, Variability: Good {> 6 bpm), Accelerations: Reactive and Decelerations: Variable: mild  Labs:  Results for orders placed or performed during the hospital encounter of 09/03/15 (from the past 24 hour(s))  CBC with Differential/Platelet   Collection Time: 09/07/15  7:56 AM  Result Value Ref Range   WBC 8.0 4.0 - 10.5 K/uL   RBC 3.71 (L) 3.87 - 5.11 MIL/uL   Hemoglobin 9.9 (L) 12.0 - 15.0 g/dL   HCT 40.929.1 (L) 81.136.0 - 91.446.0 %   MCV 78.4 78.0 - 100.0 fL   MCH 26.7 26.0 - 34.0 pg   MCHC 34.0 30.0 - 36.0 g/dL   RDW 78.214.4 95.611.5 - 21.315.5 %   Platelets 270 150 - 400 K/uL   Neutrophils Relative % 67 %   Neutro Abs 5.3 1.7 - 7.7 K/uL   Lymphocytes Relative 25 %   Lymphs Abs 2.0 0.7 - 4.0 K/uL   Monocytes Relative 7 %   Monocytes Absolute 0.6 0.1 - 1.0 K/uL   Eosinophils Relative 1 %   Eosinophils Absolute 0.1 0.0 - 0.7 K/uL   Basophils Relative 0 %   Basophils Absolute 0.0 0.0 - 0.1 K/uL  Glucose, capillary   Collection Time: 09/07/15 10:39 AM  Result Value Ref Range   Glucose-Capillary 53 (L) 65 - 99 mg/dL  Glucose, capillary   Collection Time: 09/07/15 11:42 AM  Result Value Ref Range   Glucose-Capillary 88 65 - 99 mg/dL  Glucose, capillary   Collection Time: 09/07/15  1:06 PM  Result Value Ref Range   Glucose-Capillary 66 65 - 99 mg/dL   Comment 1 Notify RN   Glucose, capillary   Collection Time: 09/07/15  2:08 PM  Result Value Ref Range   Glucose-Capillary 52 (L) 65 - 99 mg/dL  Glucose, capillary   Collection Time: 09/07/15  2:38 PM  Result Value Ref Range   Glucose-Capillary 72 65 - 99 mg/dL  Glucose, capillary   Collection Time: 09/07/15  4:19 PM  Result Value Ref Range   Glucose-Capillary 111 (H) 65 - 99 mg/dL  Glucose, capillary   Collection Time: 09/07/15  8:27 PM  Result Value Ref Range   Glucose-Capillary 78 65 - 99 mg/dL  Glucose, capillary   Collection Time: 09/07/15 10:45 PM  Result Value Ref Range   Glucose-Capillary 165 (H) 65 - 99 mg/dL  Medications:  Scheduled . amoxicillin  500 mg Oral Q8H  . azithromycin  500 mg Oral Daily  . docusate sodium  100 mg Oral Daily  . insulin aspart  10 Units Subcutaneous TID WC  . insulin NPH Human  28 Units Subcutaneous BID AC & HS  . prenatal multivitamin  1 tablet Oral Q1200   I have reviewed the patient's current medications.  ASSESSMENT: Active Problems:   Diabetes mellitus type 2, uncontrolled, without complications (HCC)   Preterm premature rupture of membranes (PPROM) with unknown onset of labor   PLAN: 1.  PPROM  Continue inpatient management of PPROM  Continue latency Abx  Delivery with s/sx's chorio or at 34 wks  NST reactive 2.  DM2  Hold insulin premeal, except at dinner.  Continue CBGs 2hr PP  and qhs.  STINSON, JACOB JEHIEL, DO 09/08/2015,6:42 AM

## 2015-09-08 NOTE — Progress Notes (Signed)
Per CNM ok to leave IV out for now and educate on reasons to restart one

## 2015-09-09 LAB — GLUCOSE, CAPILLARY
GLUCOSE-CAPILLARY: 56 mg/dL — AB (ref 65–99)
GLUCOSE-CAPILLARY: 84 mg/dL (ref 65–99)
Glucose-Capillary: 92 mg/dL (ref 65–99)
Glucose-Capillary: 65 mg/dL (ref 65–99)
Glucose-Capillary: 176 mg/dL — ABNORMAL HIGH (ref 65–99)

## 2015-09-09 MED ORDER — INSULIN NPH (HUMAN) (ISOPHANE) 100 UNIT/ML ~~LOC~~ SUSP
23.0000 [IU] | Freq: Two times a day (BID) | SUBCUTANEOUS | Status: DC
  Administered 2015-09-09 – 2015-09-14 (×10): 23 [IU] via SUBCUTANEOUS

## 2015-09-09 NOTE — Progress Notes (Signed)
Results for [name], Andrea [name] (MRN [id number]) as of 09/09/2015 15:59  Ref. Range 09/08/2015 11:22 09/08/2015 13:44 09/08/2015 14:51 09/08/2015 16:54 09/08/2015 18:17 09/08/2015 20:25 09/09/2015 09:12 09/09/2015 11:55 09/09/2015 14:50  Glucose-Capillary Latest Ref Range: 65-99 mg/dL 62 (L) 67 80 956122 (H) 82 115 (H) 56 (L) 84 92   FBS < 60 mg/dL. Needs insulin adjustment.  Recommendations: Decrease NPH to 23 units bid.  Will continue to follow. Thank you. Andrea [name], RD, LDN, CDE Inpatient Diabetes Coordinator [phone]

## 2015-09-09 NOTE — Progress Notes (Signed)
Patient ID: Andrea [name], female   DOB: 21-Mar-1991, 25 y.o.   MRN: [id number] FACULTY PRACTICE ANTEPARTUM(COMPREHENSIVE) NOTE  Andrea [name] is a 25 y.o. G1P0000 at 4825w1d by best clinical estimate who is admitted for PROM.   Fetal presentation is cephalic. Length of Stay:  6  Days  Subjective: Decreased insulin again yesterday. Feels well. Patient reports the fetal movement as active. Patient reports uterine contraction  activity as none. Patient reports  vaginal bleeding as none. Patient describes fluid per vagina as Clear.  Vitals:  Blood pressure 130/79, pulse 96, temperature 99.3 F (37.4 C), temperature source Oral, resp. rate 20, height 5\' 2"  (1.575 m), weight 250 lb 3.2 oz (113.49 kg), last menstrual period 01/20/2015. Physical Examination:  General appearance - alert, well appearing, and in no distress Chest - normal effort Abdomen - gravid, NT Fundal Height:  size equals dates Extremities: Homans sign is negative, no sign of DVT  Membranes:ruptured, clear fluid  Fetal Monitoring:  Baseline: 145 bpm, Variability: Good {> 6 bpm), Accelerations: Reactive and Decelerations: Absent  Labs:  Results for orders placed or performed during the hospital encounter of 09/03/15 (from the past 24 hour(s))  Glucose, capillary   Collection Time: 09/08/15 11:22 AM  Result Value Ref Range   Glucose-Capillary 62 (L) 65 - 99 mg/dL  Glucose, capillary   Collection Time: 09/08/15  1:44 PM  Result Value Ref Range   Glucose-Capillary 67 65 - 99 mg/dL  Glucose, capillary   Collection Time: 09/08/15  2:51 PM  Result Value Ref Range   Glucose-Capillary 80 65 - 99 mg/dL   Comment 1 Notify RN   Glucose, capillary   Collection Time: 09/08/15  4:54 PM  Result Value Ref Range   Glucose-Capillary 122 (H) 65 - 99 mg/dL  Glucose, capillary   Collection Time: 09/08/15  6:17 PM  Result Value Ref Range   Glucose-Capillary 82 65 - 99 mg/dL   Comment 1 Notify RN   Glucose, capillary   Collection Time: 09/08/15  8:25 PM  Result Value Ref Range   Glucose-Capillary 115 (H) 65 - 99 mg/dL     Medications:  Scheduled . amoxicillin  500 mg Oral Q8H  . azithromycin  500 mg Oral Daily  . docusate sodium  100 mg Oral Daily  . insulin aspart  10 Units Subcutaneous QAC supper  . insulin NPH Human  25 Units Subcutaneous BID AC & HS  . prenatal multivitamin  1 tablet Oral Q1200   I have reviewed the patient's current medications.  ASSESSMENT: Patient Active Problem List   Diagnosis Date Noted  . Preterm premature rupture of membranes (PPROM) with unknown onset of labor 09/03/2015  . Obesity affecting pregnancy, antepartum 04/16/2015  . Preexisting diabetes complicating pregnancy, antepartum 04/15/2015  . Supervision of high risk pregnancy, antepartum 04/15/2015  . Diabetes mellitus type 2, uncontrolled, without complications (HCC) 10/31/2012    PLAN: Continue inpatient management Latency Abx Delivery at 34 wks or with s/sx's chorio Glycemic control.  Reva BoresPRATT,TANYA S, MD 09/09/2015,7:06 AM

## 2015-09-10 ENCOUNTER — Inpatient Hospital Stay (HOSPITAL_COMMUNITY): Payer: Medicaid Other

## 2015-09-10 DIAGNOSIS — O42913 Preterm premature rupture of membranes, unspecified as to length of time between rupture and onset of labor, third trimester: Secondary | ICD-10-CM | POA: Diagnosis not present

## 2015-09-10 LAB — GLUCOSE, CAPILLARY
GLUCOSE-CAPILLARY: 67 mg/dL (ref 65–99)
GLUCOSE-CAPILLARY: 88 mg/dL (ref 65–99)
GLUCOSE-CAPILLARY: 62 mg/dL — AB (ref 65–99)
GLUCOSE-CAPILLARY: 71 mg/dL (ref 65–99)
GLUCOSE-CAPILLARY: 95 mg/dL (ref 65–99)
Glucose-Capillary: 81 mg/dL (ref 65–99)
Glucose-Capillary: 167 mg/dL — ABNORMAL HIGH (ref 65–99)

## 2015-09-10 LAB — TYPE AND SCREEN
ABO/RH(D): B POS
Antibody Screen: NEGATIVE

## 2015-09-10 MED ORDER — INSULIN ASPART 100 UNIT/ML ~~LOC~~ SOLN
14.0000 [IU] | Freq: Every day | SUBCUTANEOUS | Status: DC
  Administered 2015-09-10 – 2015-09-14 (×5): 14 [IU] via SUBCUTANEOUS

## 2015-09-10 NOTE — Progress Notes (Addendum)
Patient ID: Andrea [name], female   DOB: [birth year], 25 y.o.   MRN: [id number] FACULTY PRACTICE ANTEPARTUM(COMPREHENSIVE) NOTE  Andrea [name] is a 25 y.o. G1P0000 at 1649w6d by best clinical estimate who is admitted for PROM.   Fetal presentation is cephalic. Length of Stay:  7  Days  Subjective: Patient feels well.  Some CBGs low.  Asymptomatic.   Patient reports the fetal movement as active. Patient reports uterine contraction  activity as none. Patient reports  vaginal bleeding as none. Patient describes fluid per vagina as Clear.  Vitals:  Blood pressure 117/67, pulse 99, temperature 98.7 F (37.1 C), temperature source Oral, resp. rate 20, height 5\' 2"  (1.575 m), weight 250 lb 3.2 oz (113.49 kg), last menstrual period 01/20/2015. Physical Examination:  General appearance - alert, well appearing, and in no distress Heart: regular rate, no murmur Lungs: clear to auscultation bilaterally, no wheezing. Abdomen - gravid, NT Fundal Height:  size equals dates Extremities: Homans sign is negative, no sign of DVT  Membranes:ruptured, clear fluid  Fetal Monitoring:  Baseline: 150 bpm, Variability: Good {> 6 bpm), Accelerations: Reactive and Decelerations: Variable: mild  Labs:  Results for orders placed or performed during the hospital encounter of 09/03/15 (from the past 24 hour(s))  Glucose, capillary   Collection Time: 09/09/15  9:12 AM  Result Value Ref Range   Glucose-Capillary 56 (L) 65 - 99 mg/dL  Glucose, capillary   Collection Time: 09/09/15 11:55 AM  Result Value Ref Range   Glucose-Capillary 84 65 - 99 mg/dL  Glucose, capillary   Collection Time: 09/09/15  2:50 PM  Result Value Ref Range   Glucose-Capillary 92 65 - 99 mg/dL  Glucose, capillary   Collection Time: 09/09/15  7:15 PM  Result Value Ref Range   Glucose-Capillary 65 65 - 99 mg/dL  Glucose, capillary   Collection Time: 09/09/15 10:02 PM  Result Value Ref Range   Glucose-Capillary 176 (H) 65 - 99 mg/dL      Medications:  Scheduled . docusate sodium  100 mg Oral Daily  . insulin aspart  14 Units Subcutaneous QAC supper  . insulin NPH Human  23 Units Subcutaneous BID AC & HS  . prenatal multivitamin  1 tablet Oral Q1200   I have reviewed the patient's current medications.  ASSESSMENT: Active Problems:   Diabetes mellitus type 2, uncontrolled, without complications (HCC)   Preterm premature rupture of membranes (PPROM) with unknown onset of labor   PLAN: 1.  PPROM  Continue inpatient management of PPROM  S/p latency antibiotics.  S/p BMZ.  Delivery with s/sx's chorio or at 34 wks  NST reactive  US today for growth 2.  DM2  Increase pre-dinner insulin to 14 units.  NPH 23 units.    Continue NST BID.  Seymour Pavlak JEHIEL, DO 09/10/2015,6:40 AM

## 2015-09-11 DIAGNOSIS — O42913 Preterm premature rupture of membranes, unspecified as to length of time between rupture and onset of labor, third trimester: Secondary | ICD-10-CM | POA: Diagnosis not present

## 2015-09-11 DIAGNOSIS — O24113 Pre-existing diabetes mellitus, type 2, in pregnancy, third trimester: Secondary | ICD-10-CM

## 2015-09-11 DIAGNOSIS — O4290 Premature rupture of membranes, unspecified as to length of time between rupture and onset of labor, unspecified weeks of gestation: Secondary | ICD-10-CM | POA: Diagnosis present

## 2015-09-11 LAB — GLUCOSE, CAPILLARY
GLUCOSE-CAPILLARY: 116 mg/dL — AB (ref 65–99)
GLUCOSE-CAPILLARY: 114 mg/dL — AB (ref 65–99)
Glucose-Capillary: 76 mg/dL (ref 65–99)
Glucose-Capillary: 118 mg/dL — ABNORMAL HIGH (ref 65–99)
Glucose-Capillary: 80 mg/dL (ref 65–99)
Glucose-Capillary: 153 mg/dL — ABNORMAL HIGH (ref 65–99)

## 2015-09-11 NOTE — Progress Notes (Signed)
Patient ID: Andrea [name], female   DOB: 1991/02/02, 25 y.o.   MRN: [id number] FACULTY PRACTICE ANTEPARTUM(COMPREHENSIVE) NOTE  Andrea [name] is a 25 y.o. G1P0000 at [redacted]w[redacted]d by best clinical estimate who is admitted for PROM.   Fetal presentation is cephalic. Length of Stay:  8  Days  Subjective: Patient feels well without complaints   Patient reports the fetal movement as active. Patient reports uterine contraction  activity as none. Patient reports  vaginal bleeding as none. Patient describes fluid per vagina as Clear.  Vitals:  Blood pressure 109/63, pulse 96, temperature 98.5 F (36.9 C), temperature source Oral, resp. rate 18, height  (1.575 m), weight 250 lb 3.2 oz (113.49 kg), last menstrual period 01/20/2015. Physical Examination:  General appearance - alert, well appearing, and in no distress Heart: regular rate, no murmur Lungs: clear to auscultation bilaterally, no wheezing. Abdomen - gravid, NT Fundal Height:  size equals dates Extremities: Homans sign is negative, no sign of DVT  Membranes:ruptured, clear fluid  Fetal Monitoring:  Baseline: 150 bpm, Variability: Good {> 6 bpm), Accelerations: Reactive and Decelerations: Variable: mild  Labs:  Results for orders placed or performed during the hospital encounter of 09/03/15 (from the past 24 hour(s))  Type and screen [facility] [place]   Collection Time: 09/10/15  8:35 AM  Result Value Ref Range   ABO/RH(D) B POS    Antibody Screen NEG    Sample Expiration 09/13/2015   Glucose, capillary   Collection Time: 09/10/15  9:44 AM  Result Value Ref Range   Glucose-Capillary 67 65 - 99 mg/dL   Comment 1 Notify RN    Comment 2 Document in Chart   Glucose, capillary   Collection Time: 09/10/15  1:17 PM  Result Value Ref Range   Glucose-Capillary 88 65 - 99 mg/dL   Comment 1 Notify RN    Comment 2 Document in Chart   Glucose, capillary   Collection Time: 09/10/15  2:31 PM  Result Value Ref Range   Glucose-Capillary 62 (L) 65 - 99 mg/dL  Glucose, capillary   Collection Time: 09/10/15  2:51 PM  Result Value Ref Range   Glucose-Capillary 71 65 - 99 mg/dL   Comment 1 Notify RN    Comment 2 Document in Chart   Glucose, capillary   Collection Time: 09/10/15  4:40 PM  Result Value Ref Range   Glucose-Capillary 81 65 - 99 mg/dL   Comment 1 Notify RN    Comment 2 Document in Chart   Glucose, capillary   Collection Time: 09/10/15  6:52 PM  Result Value Ref Range   Glucose-Capillary 95 65 - 99 mg/dL   Comment 1 Notify RN    Comment 2 Document in Chart   Glucose, capillary   Collection Time: 09/10/15 11:19 PM  Result Value Ref Range   Glucose-Capillary 167 (H) 65 - 99 mg/dL     Medications:  Scheduled . docusate sodium  100 mg Oral Daily  . insulin aspart  14 Units Subcutaneous QAC supper  . insulin NPH Human  23 Units Subcutaneous BID AC & HS  . prenatal multivitamin  1 tablet Oral Q1200   I have reviewed the patient's current medications.  ASSESSMENT: Active Problems:   Diabetes mellitus type 2, uncontrolled, without complications (HCC)   Preterm premature rupture of membranes (PPROM) with unknown onset of labor   PLAN: 1.  PPROM  Continue inpatient management of PPROM  S/p latency antibiotics.  S/p BMZ.  Delivery with  s/sx's chorio or at 34 wks  NST reactive  5/30 EFW 4 lb 10 oz  2.  DM2  Continue current insulin regimen  Continue NST BID.  Foye Damron, MD 09/11/2015,7:29 AM

## 2015-09-12 DIAGNOSIS — O42919 Preterm premature rupture of membranes, unspecified as to length of time between rupture and onset of labor, unspecified trimester: Secondary | ICD-10-CM

## 2015-09-12 DIAGNOSIS — O24113 Pre-existing diabetes mellitus, type 2, in pregnancy, third trimester: Secondary | ICD-10-CM

## 2015-09-12 DIAGNOSIS — Z794 Long term (current) use of insulin: Secondary | ICD-10-CM

## 2015-09-12 DIAGNOSIS — Z3A33 33 weeks gestation of pregnancy: Secondary | ICD-10-CM

## 2015-09-12 LAB — GLUCOSE, CAPILLARY
GLUCOSE-CAPILLARY: 101 mg/dL — AB (ref 65–99)
GLUCOSE-CAPILLARY: 171 mg/dL — AB (ref 65–99)
GLUCOSE-CAPILLARY: 106 mg/dL — AB (ref 65–99)
Glucose-Capillary: 87 mg/dL (ref 65–99)
Glucose-Capillary: 171 mg/dL — ABNORMAL HIGH (ref 65–99)

## 2015-09-12 NOTE — Progress Notes (Signed)
Patient ID: Andrea [name], female   DOB: [birth year], 25 y.o.   MRN: [id number] FACULTY PRACTICE ANTEPARTUM(COMPREHENSIVE) NOTE  Andrea [name] is a 25 y.o. G1P0000 at [redacted]w[redacted]d   who is admitted for rupture of membranes Fetal presentation is cephalic. Length of Stay:  9  Days  Subjective: Pt denies contractions  Patient reports the fetal movement as active. Patient reports uterine contraction  activity as none. Patient reports  vaginal bleeding as none. Patient describes fluid per vagina as Clear.  Vitals:  Blood pressure 111/55, pulse 97, temperature 98.8 F (37.1 C), temperature source Oral, resp. rate 18, height  (1.575 m), weight 112.719 kg (248 lb 8 oz), last menstrual period 01/20/2015. Physical Examination:  General appearance - alert, well appearing, and in no distress and normal appearing weight Heart - normal rate and regular rhythm Abdomen - soft, nontender, nondistended Fundal Height:  size equals dates Cervical Exam: Not evaluated. and . Extremities: extremities normal, atraumatic, no cyanosis or edema and Homans sign is negative, no sign of DVT with DTRs 2+ bilaterally Membranes:, ruptured  Fetal Monitoring:  Baseline: 150 bpm, with good variability, no decels  Labs:  Results for orders placed or performed during the hospital encounter of 09/03/15 (from the past 24 hour(s))  Glucose, capillary   Collection Time: 09/11/15  8:41 AM  Result Value Ref Range   Glucose-Capillary 76 65 - 99 mg/dL   Comment 1 Notify RN    Comment 2 Document in Chart   Glucose, capillary   Collection Time: 09/11/15 10:52 AM  Result Value Ref Range   Glucose-Capillary 116 (H) 65 - 99 mg/dL   Comment 1 Notify RN    Comment 2 Document in Chart   Glucose, capillary   Collection Time: 09/11/15  2:33 PM  Result Value Ref Range   Glucose-Capillary 114 (H) 65 - 99 mg/dL   Comment 1 Notify RN    Comment 2 Document in Chart   Glucose, capillary   Collection Time: 09/11/15  5:02 PM   Result Value Ref Range   Glucose-Capillary 118 (H) 65 - 99 mg/dL   Comment 1 Notify RN    Comment 2 Document in Chart   Glucose, capillary   Collection Time: 09/11/15  8:45 PM  Result Value Ref Range   Glucose-Capillary 80 65 - 99 mg/dL  Glucose, capillary   Collection Time: 09/11/15 11:03 PM  Result Value Ref Range   Glucose-Capillary 153 (H) 65 - 99 mg/dL   CBG (last 3)   Recent Labs  09/11/15 1702 09/11/15 2045 09/11/15 2303  GLUCAP 118* 80 153*      Imaging Studies:  AFI 10 on last u/s 5/30, low normal . Monitor strip reviewed, reassuring.no contractions  Currently EPIC will not allow sonographic studies to automatically populate into notes.  In the meantime, copy and paste results into note or free text.  Medications:  Scheduled . docusate sodium  100 mg Oral Daily  . insulin aspart  14 Units Subcutaneous QAC supper  . insulin NPH Human  23 Units Subcutaneous BID AC & HS  . prenatal multivitamin  1 tablet Oral Q1200   I have reviewed the patient's current medications.  ASSESSMENT: Patient Active Problem List   Diagnosis Date Noted  . Pre-existing type 2 diabetes mellitus in pregnancy in third trimester   . Premature rupture of membranes   . Preterm premature rupture of membranes (PPROM) with unknown onset of labor 09/03/2015  . Obesity affecting pregnancy, antepartum 04/16/2015  .  Preexisting diabetes complicating pregnancy, antepartum 04/15/2015  . Supervision of high risk pregnancy, antepartum 04/15/2015  . Diabetes mellitus type 2, uncontrolled, without complications (HCC) 10/31/2012    PLAN: 1. PPROM  Continue inpatient management of PPROM  S/p latency antibiotics.  S/p BMZ.  Delivery with s/sx's chorio or at 34 wks  NST reactive  5/30 EFW 4 lb 10 oz, AFI 10  2. DM2  Continue current insulin regimen  Continue NST BID.    Nikisha Fleece V 09/12/2015,7:06 AM

## 2015-09-13 DIAGNOSIS — O42913 Preterm premature rupture of membranes, unspecified as to length of time between rupture and onset of labor, third trimester: Secondary | ICD-10-CM | POA: Diagnosis not present

## 2015-09-13 LAB — TYPE AND SCREEN
ABO/RH(D): B POS
Antibody Screen: NEGATIVE

## 2015-09-13 LAB — GLUCOSE, CAPILLARY
GLUCOSE-CAPILLARY: 73 mg/dL (ref 65–99)
GLUCOSE-CAPILLARY: 163 mg/dL — AB (ref 65–99)
GLUCOSE-CAPILLARY: 116 mg/dL — AB (ref 65–99)
GLUCOSE-CAPILLARY: 144 mg/dL — AB (ref 65–99)
GLUCOSE-CAPILLARY: 107 mg/dL — AB (ref 65–99)
Glucose-Capillary: 184 mg/dL — ABNORMAL HIGH (ref 65–99)

## 2015-09-13 NOTE — Progress Notes (Signed)
Patient ID: Andrea [name], female   DOB: [birth year], 25 y.o.   MRN: [id number] ACULTY PRACTICE ANTEPARTUM COMPREHENSIVE PROGRESS NOTE  Andrea [name] is a 25 y.o. G1P0000 at 791w5d  who is admitted for PROM.   Fetal presentation is cephalic. Length of Stay:  10  Days  Subjective: Pt with no complaints.  Does not eat at regularly scheduled times.  Had soda in room. Pt reports that she is aware of the diet she should be on.    Patient reports good fetal movement.  She reports no uterine contractions, and no bleeding.  Reports continued loss of fluid per vagina.  Vitals:  Blood pressure 132/76, pulse 128, temperature 98.7 F (37.1 C), temperature source Oral, resp. rate 18, height 5\' 2"  (1.575 m), weight 248 lb 8 oz (112.719 kg), last menstrual period 01/20/2015. Physical Examination: General appearance - alert, well appearing, and in no distress Abdomen - soft, nontender, nondistended, no masses or organomegaly gravid Extremities - peripheral pulses normal, no pedal edema, no clubbing or cyanosis Cervical Exam: Not evaluated.  Membranes:ruptured  Fetal Monitoring:  Baseline: 150's bpm, Variability: Good {> 6 bpm), Accelerations: Reactive, Decelerations: Absent and TOCO with no contractions  Labs:  Results for orders placed or performed during the hospital encounter of 09/03/15 (from the past 24 hour(s))  Glucose, capillary   Collection Time: 09/12/15  7:50 AM  Result Value Ref Range   Glucose-Capillary 101 (H) 65 - 99 mg/dL  Glucose, capillary   Collection Time: 09/12/15  9:54 AM  Result Value Ref Range   Glucose-Capillary 171 (H) 65 - 99 mg/dL  Glucose, capillary   Collection Time: 09/12/15  3:09 PM  Result Value Ref Range   Glucose-Capillary 106 (H) 65 - 99 mg/dL   Comment 1 Notify RN    Comment 2 Document in Chart   Glucose, capillary   Collection Time: 09/12/15  5:06 PM  Result Value Ref Range   Glucose-Capillary 87 65 - 99 mg/dL   Comment 1 Notify RN    Comment 2  Document in Chart   Glucose, capillary   Collection Time: 09/12/15  7:09 PM  Result Value Ref Range   Glucose-Capillary 171 (H) 65 - 99 mg/dL    Imaging Studies:    None pending   Medications:  Scheduled . docusate sodium  100 mg Oral Daily  . insulin aspart  14 Units Subcutaneous QAC supper  . insulin NPH Human  23 Units Subcutaneous BID AC & HS  . prenatal multivitamin  1 tablet Oral Q1200   I have reviewed the patient's current medications.  ASSESSMENT: Patient Active Problem List   Diagnosis Date Noted  . Pre-existing type 2 diabetes mellitus in pregnancy in third trimester   . Premature rupture of membranes   . Preterm premature rupture of membranes (PPROM) with unknown onset of labor 09/03/2015  . Obesity affecting pregnancy, antepartum 04/16/2015  . Preexisting diabetes complicating pregnancy, antepartum 04/15/2015  . Supervision of high risk pregnancy, antepartum 04/15/2015  . Diabetes mellitus type 2, uncontrolled, without complications (HCC) 10/31/2012    PLAN: 1. PPROM  Continue inpatient management of PPROM  S/p latency antibiotics.  S/p BMZ.  Delivery with s/sx's chorio or at 34 wks  NST reactive  5/30 EFW 4 lb 10 oz  2. DM2  Continue current insulin regimen  Continue NST BID.   HARRAWAY-SMITH, Jakobee Brackins 09/13/2015,6:10 AM

## 2015-09-14 DIAGNOSIS — Z3A33 33 weeks gestation of pregnancy: Secondary | ICD-10-CM

## 2015-09-14 DIAGNOSIS — O42919 Preterm premature rupture of membranes, unspecified as to length of time between rupture and onset of labor, unspecified trimester: Secondary | ICD-10-CM

## 2015-09-14 DIAGNOSIS — O24113 Pre-existing diabetes mellitus, type 2, in pregnancy, third trimester: Secondary | ICD-10-CM

## 2015-09-14 DIAGNOSIS — Z794 Long term (current) use of insulin: Secondary | ICD-10-CM

## 2015-09-14 LAB — GLUCOSE, CAPILLARY
GLUCOSE-CAPILLARY: 69 mg/dL (ref 65–99)
GLUCOSE-CAPILLARY: 86 mg/dL (ref 65–99)
GLUCOSE-CAPILLARY: 179 mg/dL — AB (ref 65–99)
GLUCOSE-CAPILLARY: 116 mg/dL — AB (ref 65–99)
GLUCOSE-CAPILLARY: 96 mg/dL (ref 65–99)
Glucose-Capillary: 76 mg/dL (ref 65–99)

## 2015-09-14 NOTE — Progress Notes (Signed)
Patient ID: Andrea [name], female   DOB: [birth year], 25 y.o.   MRN: [id number] Patient ID: Andrea [name], female   DOB: [birth year], 25 y.o.   MRN: [id number] ACULTY PRACTICE ANTEPARTUM COMPREHENSIVE PROGRESS NOTE  Andrea [name] is a 25 y.o. G1P0000 at 5235w6d  who is admitted for PROM.   Fetal presentation is cephalic. Length of Stay:  11  Days  Subjective: Pt with no complaints.  Does not eat at regularly scheduled times.  Had soda in room. Pt reports that she is aware of the diet she should be on.    Patient reports good fetal movement.  She reports no uterine contractions, and no bleeding.  Reports continued loss of fluid per vagina.  Vitals:  Blood pressure 113/65, pulse 85, temperature 98.9 F (37.2 C), temperature source Oral, resp. rate 18, height 5\' 2"  (1.575 m), weight 248 lb 8 oz (112.719 kg), last menstrual period 01/20/2015. Physical Examination: General appearance - alert, well appearing, and in no distress Abdomen - soft, nontender, nondistended, no masses or organomegaly gravid Extremities - peripheral pulses normal, no pedal edema, no clubbing or cyanosis Cervical Exam: Not evaluated.  Membranes:ruptured  Fetal Monitoring:  Baseline: 150's bpm, Variability: Good {> 6 bpm), Accelerations: Reactive, Decelerations: Absent and TOCO with no contractions  Labs:  Results for orders placed or performed during the hospital encounter of 09/03/15 (from the past 24 hour(s))  Glucose, capillary   Collection Time: 09/13/15  2:57 PM  Result Value Ref Range   Glucose-Capillary 116 (H) 65 - 99 mg/dL  Glucose, capillary   Collection Time: 09/13/15  5:18 PM  Result Value Ref Range   Glucose-Capillary 144 (H) 65 - 99 mg/dL  Glucose, capillary   Collection Time: 09/13/15  8:13 PM  Result Value Ref Range   Glucose-Capillary 107 (H) 65 - 99 mg/dL  Glucose, capillary   Collection Time: 09/13/15 10:28 PM  Result Value Ref Range   Glucose-Capillary 184 (H) 65 - 99 mg/dL  Glucose,  capillary   Collection Time: 09/14/15  8:14 AM  Result Value Ref Range   Glucose-Capillary 69 65 - 99 mg/dL  Glucose, capillary   Collection Time: 09/14/15 10:19 AM  Result Value Ref Range   Glucose-Capillary 86 65 - 99 mg/dL   Comment 1 Notify RN   Glucose, capillary   Collection Time: 09/14/15  1:39 PM  Result Value Ref Range   Glucose-Capillary 76 65 - 99 mg/dL   Comment 1 Notify RN     Imaging Studies:    None pending   Medications:  Scheduled . docusate sodium  100 mg Oral Daily  . insulin aspart  14 Units Subcutaneous QAC supper  . insulin NPH Human  23 Units Subcutaneous BID AC & HS  . prenatal multivitamin  1 tablet Oral Q1200   I have reviewed the patient's current medications.  ASSESSMENT: Patient Active Problem List   Diagnosis Date Noted  . Pre-existing type 2 diabetes mellitus in pregnancy in third trimester   . Premature rupture of membranes   . Preterm premature rupture of membranes (PPROM) with unknown onset of labor 09/03/2015  . Obesity affecting pregnancy, antepartum 04/16/2015  . Preexisting diabetes complicating pregnancy, antepartum 04/15/2015  . Supervision of high risk pregnancy, antepartum 04/15/2015  . Diabetes mellitus type 2, uncontrolled, without complications (HCC) 10/31/2012    PLAN: 1. PPROM  Continue inpatient management of PPROM  S/p latency antibiotics.  S/p BMZ.  Delivery with s/sx's chorio or at 34 wks  NST  reactive  5/30 EFW 4 lb 10 oz  2. DM2  Continue current insulin regimen  Continue NST BID, induction planned for tonight    EURE,LUTHER H 09/14/2015,2:50 PM

## 2015-09-14 NOTE — Consult Note (Signed)
Neonatology Consult to Antenatal Patient:  I was asked by Dr. Despina HiddenEure to see this patient in order to provide antenatal counseling due to anticipated delivery in the next 1-2 days at 34 weeks.  Ms. Andrea [name] was admitted on 5/23 due to premature ROM and is now 4233 6/[redacted] weeks GA. Sh is a pre-gestational Type 2 diabetic, on insulin. She is currently not having active labor. She got BMZ on 5/23-24 and was treated with IV Ampicillin (changing to oral Amoxicillin) and Azithromycin. An induction of labor will begin tonight at midnight. This is her first child, a boy.  I spoke with the patient with her husband and mother present. We discussed usual DR management, possible respiratory complications and need for support, IV access, feedings (mother desires breast feeding, which was encouraged), LOS, Mortality and Morbidity, and long term outcomes. She had a few questions, which I answered. She has already toured the NICU. I would be glad to come back if she has more questions later.  Thank you for asking me to see this patient.  Doretha Souhristie C. Quinette Hentges, MD Neonatologist  The total length of face-to-face or floor/unit time for this encounter was 25 minutes. Counseling and/or coordination of care was 15 minutes of the above.

## 2015-09-15 ENCOUNTER — Inpatient Hospital Stay (HOSPITAL_COMMUNITY): Admit: 2015-09-15 | Payer: Medicaid Other

## 2015-09-15 ENCOUNTER — Inpatient Hospital Stay (HOSPITAL_COMMUNITY): Payer: Medicaid Other | Admitting: Anesthesiology

## 2015-09-15 DIAGNOSIS — Z794 Long term (current) use of insulin: Secondary | ICD-10-CM

## 2015-09-15 DIAGNOSIS — O42919 Preterm premature rupture of membranes, unspecified as to length of time between rupture and onset of labor, unspecified trimester: Secondary | ICD-10-CM

## 2015-09-15 DIAGNOSIS — O42013 Preterm premature rupture of membranes, onset of labor within 24 hours of rupture, third trimester: Secondary | ICD-10-CM | POA: Diagnosis present

## 2015-09-15 DIAGNOSIS — Z3A34 34 weeks gestation of pregnancy: Secondary | ICD-10-CM

## 2015-09-15 DIAGNOSIS — O24113 Pre-existing diabetes mellitus, type 2, in pregnancy, third trimester: Secondary | ICD-10-CM

## 2015-09-15 LAB — GLUCOSE, CAPILLARY
GLUCOSE-CAPILLARY: 92 mg/dL (ref 65–99)
GLUCOSE-CAPILLARY: 93 mg/dL (ref 65–99)
GLUCOSE-CAPILLARY: 114 mg/dL — AB (ref 65–99)
GLUCOSE-CAPILLARY: 94 mg/dL (ref 65–99)
Glucose-Capillary: 76 mg/dL (ref 65–99)
Glucose-Capillary: 153 mg/dL — ABNORMAL HIGH (ref 65–99)

## 2015-09-15 LAB — CBC
HCT: 34 % — ABNORMAL LOW (ref 36.0–46.0)
HEMOGLOBIN: 12 g/dL (ref 12.0–15.0)
MCH: 27.3 pg (ref 26.0–34.0)
MCHC: 35.3 g/dL (ref 30.0–36.0)
MCV: 77.3 fL — ABNORMAL LOW (ref 78.0–100.0)
Platelets: 351 10*3/uL (ref 150–400)
RBC: 4.4 MIL/uL (ref 3.87–5.11)
RDW: 14.1 % (ref 11.5–15.5)
WBC: 8 10*3/uL (ref 4.0–10.5)

## 2015-09-15 LAB — RPR: RPR: NONREACTIVE

## 2015-09-15 MED ORDER — FENTANYL CITRATE (PF) 100 MCG/2ML IJ SOLN
100.0000 ug | INTRAMUSCULAR | Status: DC | PRN
  Administered 2015-09-15 (×3): 100 ug via INTRAVENOUS
  Filled 2015-09-15 (×3): qty 2

## 2015-09-15 MED ORDER — ACETAMINOPHEN 325 MG PO TABS: 650.0000 mg | ORAL_TABLET | ORAL | Status: DC | PRN

## 2015-09-15 MED ORDER — LACTATED RINGERS IV SOLN: 500.0000 mL | Freq: Once | INTRAVENOUS | Status: DC

## 2015-09-15 MED ORDER — EPHEDRINE 5 MG/ML INJ: 10.0000 mg | INTRAVENOUS | Status: DC | PRN

## 2015-09-15 MED ORDER — PHENYLEPHRINE 40 MCG/ML (10ML) SYRINGE FOR IV PUSH (FOR BLOOD PRESSURE SUPPORT): 80.0000 ug | PREFILLED_SYRINGE | INTRAVENOUS | Status: DC | PRN

## 2015-09-15 MED ORDER — SODIUM CHLORIDE 0.9 % IV SOLN: 14.0000 mL/h | INTRAVENOUS | Status: DC | PRN

## 2015-09-15 MED ORDER — PHENYLEPHRINE 40 MCG/ML (10ML) SYRINGE FOR IV PUSH (FOR BLOOD PRESSURE SUPPORT)
80.0000 ug | PREFILLED_SYRINGE | INTRAVENOUS | Status: DC | PRN
  Filled 2015-09-15: qty 10

## 2015-09-15 MED ORDER — TERBUTALINE SULFATE 1 MG/ML IJ SOLN: 0.2500 mg | Freq: Once | INTRAMUSCULAR | Status: DC | PRN

## 2015-09-15 MED ORDER — DIPHENHYDRAMINE HCL 50 MG/ML IJ SOLN: 12.5000 mg | INTRAMUSCULAR | Status: DC | PRN

## 2015-09-15 MED ORDER — LIDOCAINE HCL (PF) 1 % IJ SOLN
INTRAMUSCULAR | Status: DC | PRN
  Administered 2015-09-15 (×2): 5 mL via EPIDURAL

## 2015-09-15 MED ORDER — OXYCODONE-ACETAMINOPHEN 5-325 MG PO TABS: 2.0000 | ORAL_TABLET | ORAL | Status: DC | PRN

## 2015-09-15 MED ORDER — SOD CITRATE-CITRIC ACID 500-334 MG/5ML PO SOLN
30.0000 mL | ORAL | Status: DC | PRN
  Administered 2015-09-16: 30 mL via ORAL
  Filled 2015-09-15: qty 15

## 2015-09-15 MED ORDER — MISOPROSTOL 50MCG HALF TABLET
50.0000 ug | ORAL_TABLET | ORAL | Status: DC | PRN
  Administered 2015-09-15 (×4): 50 ug via ORAL
  Filled 2015-09-15 (×4): qty 0.5

## 2015-09-15 MED ORDER — FENTANYL 2.5 MCG/ML BUPIVACAINE 1/10 % EPIDURAL INFUSION (WH - ANES)
14.0000 mL/h | INTRAMUSCULAR | Status: DC | PRN
  Administered 2015-09-15 – 2015-09-16 (×4): 14 mL/h via EPIDURAL
  Filled 2015-09-15 (×4): qty 125

## 2015-09-15 MED ORDER — LACTATED RINGERS IV SOLN
500.0000 mL | Freq: Once | INTRAVENOUS | Status: AC
  Administered 2015-09-15: 500 mL via INTRAVENOUS

## 2015-09-15 MED ORDER — OXYTOCIN 40 UNITS IN LACTATED RINGERS INFUSION - SIMPLE MED
1.0000 m[IU]/min | INTRAVENOUS | Status: DC
  Administered 2015-09-15: 2 m[IU]/min via INTRAVENOUS

## 2015-09-15 MED ORDER — OXYTOCIN 40 UNITS IN LACTATED RINGERS INFUSION - SIMPLE MED
2.5000 [IU]/h | INTRAVENOUS | Status: DC
  Filled 2015-09-15: qty 1000

## 2015-09-15 MED ORDER — LACTATED RINGERS IV SOLN
INTRAVENOUS | Status: DC
  Administered 2015-09-15 – 2015-09-16 (×3): via INTRAVENOUS

## 2015-09-15 MED ORDER — LACTATED RINGERS IV SOLN: 500.0000 mL | INTRAVENOUS | Status: DC | PRN

## 2015-09-15 MED ORDER — ONDANSETRON HCL 4 MG/2ML IJ SOLN: 4.0000 mg | Freq: Four times a day (QID) | INTRAMUSCULAR | Status: DC | PRN

## 2015-09-15 MED ORDER — OXYCODONE-ACETAMINOPHEN 5-325 MG PO TABS: 1.0000 | ORAL_TABLET | ORAL | Status: DC | PRN

## 2015-09-15 MED ORDER — FLEET ENEMA 7-19 GM/118ML RE ENEM: 1.0000 | ENEMA | RECTAL | Status: DC | PRN

## 2015-09-15 MED ORDER — LIDOCAINE HCL (PF) 1 % IJ SOLN: 30.0000 mL | INTRAMUSCULAR | Status: DC | PRN

## 2015-09-15 MED ORDER — OXYTOCIN BOLUS FROM INFUSION: 500.0000 mL | INTRAVENOUS | Status: DC

## 2015-09-15 NOTE — Progress Notes (Signed)
Labor Progress Note Andrea [name] is a 25 y.o. G1P0000 at 6732w0d admitted for IOL for PPROM S: No complaint at this time. Pain well controlled with epidural. Some mild range BP's this evening. Denies headache, vision changes or RUQ pain.   O:  BP 142/85 mmHg  Pulse 103  Temp(Src) 98.1 F (36.7 C) (Oral)  Resp 22  Ht 5\' 2"  (1.575 m)  Wt 248 lb 8 oz (112.719 kg)  BMI 45.44 kg/m2  SpO2 99%  LMP 01/20/2015 (Exact Date) EFM: 150/Mod var/no decels  CVE: Dilation: 1.5 Effacement (%): 50 Cervical Position: Middle Station: -2 Presentation: Vertex Exam by:: D Jasso, RN   A&P: 25 y.o. G1P0000 8032w0d admitted for IOL for PPROM. S/p BMZ x2 and Antibiotics. #Labor: cytotec to pitocin.  #Pain: epidural #FWB: CAT-I #ID: s/p course of Antibiotics for PPROM. GBS negative  Andrea Herculesaye T Rayquan Amrhein, MD 9:21 PM

## 2015-09-15 NOTE — Progress Notes (Signed)
Labor Progress Note Andrea [name] is a 25 y.o. G1P0000 at 5768w0d presented for IOl for PPROM after prolonged hospitalization.   S: No contractions. Feeling well and excited about having her baby  O:  BP 113/78 mmHg  Pulse 105  Temp(Src) 99.6 F (37.6 C) (Oral)  Resp 18  Ht 5\' 2"  (1.575 m)  Wt 248 lb 8 oz (112.719 kg)  BMI 45.44 kg/m2  LMP 01/20/2015 (Exact Date) EFM: 145/mod/+accels, no decels  CVE: Dilation: 1 Effacement (%): 50 Station: -3 Presentation: Vertex Exam by:: Dr. Alvester MorinNewton   A&P: 25 y.o. G1P0000 3068w0d here for IOL for PPROM #Labor: Will start with cytotec 50mcg PO. In 2-3 doses plan for FB as her cervix in posterior.  Indications for IOL for this patient is PPROM. I discussed the indication for, risk, benefits and alternatives to induction of labor.  Discussed methods of cervical ripening including Cytotec and foley bulb as well initiation of pitocin. Discussed need for fetal monitoring with Cytotec administration and pitocin initiation. Patient voiced understanding. Questions were answered and patient agreed to proceed with induction of labor.    #Pain: prn medications #FWB:  Cat I. S/p BMZ #GBS negative  Federico FlakeKimberly Niles Tiger Spieker, MD 1:05 AM

## 2015-09-15 NOTE — Progress Notes (Signed)
Andrea [name] is a 25 y.o. G1P0000 at 3728w0d by ultrasound admitted for induction of labor due to Spontaneous rupture of BOW.  Subjective:   Objective: BP 132/74 mmHg  Pulse 93  Temp(Src) 97.7 F (36.5 C) (Oral)  Resp 18  Ht 5\' 2"  (1.575 m)  Wt 248 lb 8 oz (112.719 kg)  BMI 45.44 kg/m2  LMP 01/20/2015 (Exact Date)      FHT:  FHR: 140's bpm, variability: moderate,  accelerations:  Present,  decelerations:  Absent UC:   irregular, every 4-7 minutes and mild SVE:   Dilation: 1 Effacement (%): 50 Station: -3 Exam by:: Dr. Alvester MorinNewton  Labs: Lab Results  Component Value Date   WBC 8.0 09/15/2015   HGB 12.0 09/15/2015   HCT 34.0* 09/15/2015   MCV 77.3* 09/15/2015   PLT 351 09/15/2015    Assessment / Plan: Induction of labor due to PROM,  progressing well on pitocin  Labor: yet to be in labor Preeclampsia:  no signs or symptoms of toxicity and intake and ouput balanced Fetal Wellbeing:  Category I Pain Control:  Labor support without medications I/D:  n/a Anticipated MOD:  NSVD  Andrea [name] 09/15/2015, 11:33 AM

## 2015-09-15 NOTE — Anesthesia Preprocedure Evaluation (Signed)
Anesthesia Evaluation  Patient identified by MRN, date of birth, ID band Patient awake    Reviewed: Allergy & Precautions, H&P , NPO status , Patient's Chart, lab work & pertinent test results  Airway Mallampati: II  TM Distance: >3 FB Neck ROM: full    Dental no notable dental hx. (+) Dental Advisory Given, Teeth Intact   Pulmonary neg pulmonary ROS,    Pulmonary exam normal breath sounds clear to auscultation       Cardiovascular Exercise Tolerance: Good negative cardio ROS Normal cardiovascular exam Rhythm:regular Rate:Normal     Neuro/Psych negative neurological ROS  negative psych ROS   GI/Hepatic negative GI ROS, Neg liver ROS,   Endo/Other  diabetes, Well Controlled, Type obesity  Renal/GU negative Renal ROS  negative genitourinary   Musculoskeletal   Abdominal (+) + obese,   Peds  Hematology negative hematology ROS (+)   Anesthesia Other Findings   Reproductive/Obstetrics negative OB ROS (+) Pregnancy                             Anesthesia Physical Anesthesia Plan  ASA: III  Anesthesia Plan: Epidural   Post-op Pain Management:    Induction:   Airway Management Planned:   Additional Equipment:   Intra-op Plan:   Post-operative Plan:   Informed Consent: I have reviewed the patients History and Physical, chart, labs and discussed the procedure including the risks, benefits and alternatives for the proposed anesthesia with the patient or authorized representative who has indicated his/her understanding and acceptance.   Dental Advisory Given  Plan Discussed with: CRNA and Surgeon  Anesthesia Plan Comments:         Anesthesia Quick Evaluation

## 2015-09-15 NOTE — Anesthesia Procedure Notes (Signed)
Epidural Patient location during procedure: OB Start time: 09/15/2015 7:05 PM End time: 09/15/2015 7:15 PM  Staffing Anesthesiologist: Ronelle NighEWELL, Allix Blomquist Performed by: anesthesiologist   Preanesthetic Checklist Completed: patient identified, site marked, surgical consent, pre-op evaluation, timeout performed, IV checked, risks and benefits discussed and monitors and equipment checked  Epidural Patient position: sitting Prep: site prepped and draped and DuraPrep Patient monitoring: continuous pulse ox and blood pressure Approach: midline Location: L3-L4 Injection technique: LOR air  Needle:  Needle type: Tuohy  Needle gauge: 17 G Needle length: 9 cm and 9 Needle insertion depth: 8 cm Catheter type: closed end flexible Catheter size: 19 Gauge Catheter at skin depth: 14 cm Test dose: negative  Assessment Sensory level: T10 Events: blood not aspirated, injection not painful, no injection resistance, negative IV test and no paresthesia  Additional Notes Patient identified. Risks/Benefits/Options discussed with patient including but not limited to bleeding, infection, nerve damage, paralysis, failed block, incomplete pain control, headache, blood pressure changes, nausea, vomiting, reactions to medication both or allergic, itching and postpartum back pain. Confirmed with bedside nurse the patient's most recent platelet count. Confirmed with patient that they are not currently taking any anticoagulation, have any bleeding history or any family history of bleeding disorders. Patient expressed understanding and wished to proceed. All questions were answered. Sterile technique was used throughout the entire procedure. Please see nursing notes for vital signs. Test dose was given through epidural catheter and negative prior to continuing to dose epidural or start infusion. Warning signs of high block given to the patient including shortness of breath, tingling/numbness in hands, complete motor block,  or any concerning symptoms with instructions to call for help. Patient was given instructions on fall risk and not to get out of bed. All questions and concerns addressed with instructions to call with any issues or inadequate analgesia.

## 2015-09-15 NOTE — Progress Notes (Signed)
Minette BrineOlivia J Wharton is a 25 y.o. G1P0000 at 7965w0d by ultrasound admitted for induction of labor due to Spontaneous rupture of BOW.  Subjective:   Objective: BP 131/64 mmHg  Pulse 89  Temp(Src) 97.7 F (36.5 C) (Oral)  Resp 18  Ht 5\' 2"  (1.575 m)  Wt 248 lb 8 oz (112.719 kg)  BMI 45.44 kg/m2  LMP 01/20/2015 (Exact Date)      FHT:  FHR: 130's bpm, variability: moderate,  accelerations:  Present,  decelerations:  Absent UC:   irregular, every 2-5 minutes and mild SVE:   Dilation: 1 Effacement (%): 50 Station: -3 Exam by:: Camelia EngK. Haynes, RN  Labs: Lab Results  Component Value Date   WBC 8.0 09/15/2015   HGB 12.0 09/15/2015   HCT 34.0* 09/15/2015   MCV 77.3* 09/15/2015   PLT 351 09/15/2015    Assessment / Plan: yet to be in labor  Labor: Progressing normally Preeclampsia:  no signs or symptoms of toxicity and intake and ouput balanced Fetal Wellbeing:  Category I Pain Control:  IV pain meds I/D:  n/a Anticipated MOD:  NSVD  Wyvonnia DuskyMarie Flynt Breeze 09/15/2015, 4:18 PM

## 2015-09-15 NOTE — Anesthesia Pain Management Evaluation Note (Signed)
  [facility] Pain Management Visit Note  Patient: Andrea BrineOlivia J Wharton, 25 y.o., female  "Hello I am a [facility]. We have an anesthesia team available at all times to provide care throughout the hospital, including epidural management and anesthesia for C-section. I don't know your plan for the delivery whether it a natural birth, water birth, IV sedation, nitrous supplementation, doula or epidural, but we want to meet your pain goals."   1.Was your pain managed to your expectations on prior hospitalizations? yes    2.What is your expectation for pain management during this hospitalization?     epidural  3.How can we help you reach that goal? Epidural when appropriate  Record the patient's initial score and the patient's pain goal.   Pain: 5  Pain Goal: 8  [facility] wants you to be able to say your pain was always managed very well.  [facility],Jhovanny Guinta 09/15/2015

## 2015-09-16 ENCOUNTER — Encounter (HOSPITAL_COMMUNITY): Payer: Self-pay | Admitting: Certified Registered Nurse Anesthetist

## 2015-09-16 ENCOUNTER — Encounter (HOSPITAL_COMMUNITY)
Admission: AD | Disposition: A | Discharge status: Home / Self Care | Payer: Self-pay | Source: Ambulatory Visit | Attending: Family Medicine

## 2015-09-16 LAB — GLUCOSE, CAPILLARY
GLUCOSE-CAPILLARY: 153 mg/dL — AB (ref 65–99)
GLUCOSE-CAPILLARY: 103 mg/dL — AB (ref 65–99)
Glucose-Capillary: 103 mg/dL — ABNORMAL HIGH (ref 65–99)
Glucose-Capillary: 92 mg/dL (ref 65–99)

## 2015-09-16 LAB — TYPE AND SCREEN
ABO/RH(D): B POS
Antibody Screen: NEGATIVE

## 2015-09-16 SURGERY — Surgical Case
Anesthesia: Epidural | Site: Abdomen

## 2015-09-16 MED ORDER — MORPHINE SULFATE (PF) 0.5 MG/ML IJ SOLN
INTRAMUSCULAR | Status: DC | PRN
  Administered 2015-09-16: 4 mg via EPIDURAL

## 2015-09-16 MED ORDER — ONDANSETRON HCL 4 MG/2ML IJ SOLN
INTRAMUSCULAR | Status: AC
Start: 2015-09-16 — End: 2015-09-16
  Filled 2015-09-16: qty 2

## 2015-09-16 MED ORDER — ONDANSETRON HCL 4 MG/2ML IJ SOLN
INTRAMUSCULAR | Status: DC | PRN
  Administered 2015-09-16: 4 mg via INTRAVENOUS

## 2015-09-16 MED ORDER — LIDOCAINE-EPINEPHRINE (PF) 2 %-1:200000 IJ SOLN
INTRAMUSCULAR | Status: AC
  Filled 2015-09-16: qty 20

## 2015-09-16 MED ORDER — WITCH HAZEL-GLYCERIN EX PADS: 1.0000 "application " | MEDICATED_PAD | CUTANEOUS | Status: DC | PRN

## 2015-09-16 MED ORDER — NALBUPHINE HCL 10 MG/ML IJ SOLN: 5.0000 mg | INTRAMUSCULAR | Status: DC | PRN

## 2015-09-16 MED ORDER — DEXAMETHASONE SODIUM PHOSPHATE 10 MG/ML IJ SOLN
INTRAMUSCULAR | Status: AC
  Filled 2015-09-16: qty 1

## 2015-09-16 MED ORDER — KETOROLAC TROMETHAMINE 30 MG/ML IJ SOLN
30.0000 mg | Freq: Four times a day (QID) | INTRAMUSCULAR | Status: DC | PRN
  Administered 2015-09-16: 30 mg via INTRAVENOUS
  Filled 2015-09-16: qty 1

## 2015-09-16 MED ORDER — ACETAMINOPHEN 500 MG PO TABS
1000.0000 mg | ORAL_TABLET | Freq: Four times a day (QID) | ORAL | Status: DC
  Administered 2015-09-16: 1000 mg via ORAL
  Filled 2015-09-16: qty 2

## 2015-09-16 MED ORDER — SCOPOLAMINE 1 MG/3DAYS TD PT72: 1.0000 | MEDICATED_PATCH | Freq: Once | TRANSDERMAL | Status: DC

## 2015-09-16 MED ORDER — DIPHENHYDRAMINE HCL 25 MG PO CAPS: 25.0000 mg | ORAL_CAPSULE | ORAL | Status: DC | PRN

## 2015-09-16 MED ORDER — SODIUM BICARBONATE 8.4 % IV SOLN
INTRAVENOUS | Status: AC
  Filled 2015-09-16: qty 50

## 2015-09-16 MED ORDER — LACTATED RINGERS IV SOLN
INTRAVENOUS | Status: DC
  Administered 2015-09-16: 600 mL via INTRAUTERINE

## 2015-09-16 MED ORDER — KETOROLAC TROMETHAMINE 30 MG/ML IJ SOLN: 30.0000 mg | Freq: Four times a day (QID) | INTRAMUSCULAR | Status: DC | PRN

## 2015-09-16 MED ORDER — SODIUM CHLORIDE 0.9% FLUSH: 3.0000 mL | INTRAVENOUS | Status: DC | PRN

## 2015-09-16 MED ORDER — DIPHENHYDRAMINE HCL 50 MG/ML IJ SOLN
INTRAMUSCULAR | Status: AC
  Filled 2015-09-16: qty 1

## 2015-09-16 MED ORDER — MEPERIDINE HCL 25 MG/ML IJ SOLN
6.2500 mg | INTRAMUSCULAR | Status: DC | PRN
Start: 2015-09-16 — End: 2015-09-16

## 2015-09-16 MED ORDER — NALBUPHINE HCL 10 MG/ML IJ SOLN: 5.0000 mg | Freq: Once | INTRAMUSCULAR | Status: DC | PRN

## 2015-09-16 MED ORDER — ONDANSETRON HCL 4 MG/2ML IJ SOLN: 4.0000 mg | Freq: Three times a day (TID) | INTRAMUSCULAR | Status: DC | PRN

## 2015-09-16 MED ORDER — CEFAZOLIN SODIUM 10 G IJ SOLR
INTRAMUSCULAR | Status: AC
  Filled 2015-09-16: qty 3000

## 2015-09-16 MED ORDER — DEXAMETHASONE SODIUM PHOSPHATE 4 MG/ML IJ SOLN
INTRAMUSCULAR | Status: AC
  Filled 2015-09-16: qty 1

## 2015-09-16 MED ORDER — MORPHINE SULFATE (PF) 0.5 MG/ML IJ SOLN
INTRAMUSCULAR | Status: AC
  Filled 2015-09-16: qty 10

## 2015-09-16 MED ORDER — DIPHENHYDRAMINE HCL 50 MG/ML IJ SOLN: 12.5000 mg | INTRAMUSCULAR | Status: DC | PRN

## 2015-09-16 MED ORDER — DEXAMETHASONE SODIUM PHOSPHATE 10 MG/ML IJ SOLN
INTRAMUSCULAR | Status: DC | PRN
  Administered 2015-09-16: 4 mg via INTRAVENOUS

## 2015-09-16 MED ORDER — NALOXONE HCL 2 MG/2ML IJ SOSY: 1.0000 ug/kg/h | PREFILLED_SYRINGE | INTRAVENOUS | Status: DC | PRN

## 2015-09-16 MED ORDER — DIBUCAINE 1 % RE OINT: 1.0000 "application " | TOPICAL_OINTMENT | RECTAL | Status: DC | PRN

## 2015-09-16 MED ORDER — SCOPOLAMINE 1 MG/3DAYS TD PT72
MEDICATED_PATCH | TRANSDERMAL | Status: AC
  Filled 2015-09-16: qty 1

## 2015-09-16 MED ORDER — SODIUM CHLORIDE 0.9 % IR SOLN
Status: DC | PRN
  Administered 2015-09-16: 1000 mL

## 2015-09-16 MED ORDER — SCOPOLAMINE 1 MG/3DAYS TD PT72
MEDICATED_PATCH | TRANSDERMAL | Status: DC | PRN
  Administered 2015-09-16: 1 via TRANSDERMAL

## 2015-09-16 MED ORDER — NALOXONE HCL 0.4 MG/ML IJ SOLN: 0.4000 mg | INTRAMUSCULAR | Status: DC | PRN

## 2015-09-16 MED ORDER — DIPHENHYDRAMINE HCL 50 MG/ML IJ SOLN
INTRAMUSCULAR | Status: DC | PRN
  Administered 2015-09-16: 25 mg via INTRAVENOUS

## 2015-09-16 MED ORDER — SODIUM BICARBONATE 8.4 % IV SOLN
INTRAVENOUS | Status: DC | PRN
  Administered 2015-09-16: 5 mL via EPIDURAL
  Administered 2015-09-16: 2 mL via EPIDURAL
  Administered 2015-09-16: 3 mL via EPIDURAL
  Administered 2015-09-16: 4 mL via EPIDURAL

## 2015-09-16 MED ORDER — OXYTOCIN 10 UNIT/ML IJ SOLN
40.0000 [IU] | INTRAVENOUS | Status: DC | PRN
  Administered 2015-09-16: 40 [IU] via INTRAVENOUS

## 2015-09-16 MED ORDER — MEPERIDINE HCL 25 MG/ML IJ SOLN
INTRAMUSCULAR | Status: AC
  Filled 2015-09-16: qty 1

## 2015-09-16 MED ORDER — MEPERIDINE HCL 25 MG/ML IJ SOLN
INTRAMUSCULAR | Status: DC | PRN
  Administered 2015-09-16: 12.5 mg via INTRAVENOUS

## 2015-09-16 MED ORDER — CEFAZOLIN SODIUM-DEXTROSE 2-3 GM-% IV SOLR
INTRAVENOUS | Status: DC | PRN
  Administered 2015-09-16: 2 g via INTRAVENOUS

## 2015-09-16 SURGICAL SUPPLY — 31 items
CLAMP CORD UMBIL (MISCELLANEOUS) IMPLANT
CLOTH BEACON ORANGE TIMEOUT ST (SAFETY) ×3 IMPLANT
DRAIN JACKSON PRT FLT 7MM (DRAIN) IMPLANT
DRESSING DISP NPWT PICO 4X16 (MISCELLANEOUS) ×2 IMPLANT
DRSG OPSITE POSTOP 4X10 (GAUZE/BANDAGES/DRESSINGS) ×3 IMPLANT
DURAPREP 26ML APPLICATOR (WOUND CARE) ×3 IMPLANT
ELECT REM PT RETURN 9FT ADLT (ELECTROSURGICAL) ×3
ELECTRODE REM PT RTRN 9FT ADLT (ELECTROSURGICAL) ×1 IMPLANT
EVACUATOR SILICONE 100CC (DRAIN) IMPLANT
EXTRACTOR VACUUM M CUP 4 TUBE (SUCTIONS) IMPLANT
EXTRACTOR VACUUM M CUP 4' TUBE (SUCTIONS)
GLOVE BIO SURGEON STRL SZ 6.5 (GLOVE) ×1 IMPLANT
GLOVE BIO SURGEON STRL SZ7 (GLOVE) ×3 IMPLANT
GLOVE BIO SURGEONS STRL SZ 6.5 (GLOVE) ×1
GLOVE BIOGEL PI IND STRL 7.0 (GLOVE) ×2 IMPLANT
GLOVE BIOGEL PI INDICATOR 7.0 (GLOVE) ×10
GOWN STRL REUS W/TWL LRG LVL3 (GOWN DISPOSABLE) ×6 IMPLANT
KIT ABG SYR 3ML LUER SLIP (SYRINGE) ×2 IMPLANT
NDL HYPO 25X5/8 SAFETYGLIDE (NEEDLE) ×1 IMPLANT
NEEDLE HYPO 25X5/8 SAFETYGLIDE (NEEDLE) ×3 IMPLANT
NS IRRIG 1000ML POUR BTL (IV SOLUTION) ×3 IMPLANT
PACK C SECTION WH (CUSTOM PROCEDURE TRAY) ×3 IMPLANT
PAD OB MATERNITY 4.3X12.25 (PERSONAL CARE ITEMS) ×3 IMPLANT
PENCIL SMOKE EVAC W/HOLSTER (ELECTROSURGICAL) ×3 IMPLANT
RTRCTR C-SECT PINK 25CM LRG (MISCELLANEOUS) ×3 IMPLANT
SUT PLAIN 2 0 XLH (SUTURE) ×2 IMPLANT
SUT VIC AB 0 CTX 36 (SUTURE) ×15
SUT VIC AB 0 CTX36XBRD ANBCTRL (SUTURE) ×5 IMPLANT
SUT VIC AB 4-0 KS 27 (SUTURE) ×3 IMPLANT
TOWEL OR 17X24 6PK STRL BLUE (TOWEL DISPOSABLE) ×3 IMPLANT
TRAY FOLEY CATH SILVER 14FR (SET/KITS/TRAYS/PACK) ×3 IMPLANT

## 2015-09-16 NOTE — Transfer of Care (Signed)
Immediate Anesthesia Transfer of Care Note  Patient: Andrea [name]  Procedure(s) Performed: Procedure(s): CESAREAN SECTION (N/A)  Patient Location: PACU  Anesthesia Type:Epidural  Level of Consciousness: awake, alert , oriented and patient cooperative  Airway & Oxygen Therapy: Patient Spontanous Breathing  Post-op Assessment: Report given to RN and Post -op Vital signs reviewed and stable  Post vital signs: Reviewed and stable  Last Vitals:  Filed Vitals:   09/16/15 1936 09/16/15 2000  BP: 120/60 129/65  Pulse: 89 95  Temp:    Resp:      Last Pain:  Filed Vitals:   09/16/15 2012  PainSc: 0-No pain      Patients Stated Pain Goal: 2 (09/15/15 1534)  Complications: No apparent anesthesia complications

## 2015-09-16 NOTE — Progress Notes (Signed)
LABOR PROGRESS NOTE  Andrea [name] is a 25 y.o. G1P0000 at 4369w1d  admitted for PPROM on 5/22 @1300   Subjective: Pt doing well with epidural in place.   Objective: BP 137/79 mmHg  Pulse 83  Temp(Src) 98.1 F (36.7 C) (Oral)  Resp 16  Ht 5\' 2"  (1.575 m)  Wt 248 lb 8 oz (112.719 kg)  BMI 45.44 kg/m2  SpO2 100%  LMP 01/20/2015 (Exact Date) or  Filed Vitals:   09/16/15 1101 09/16/15 1115 09/16/15 1130 09/16/15 1201  BP: 116/68  123/76 137/79  Pulse: 91  86 83  Temp:   98.1 F (36.7 C)   TempSrc:   Oral   Resp:  16    Height:      Weight:      SpO2:        Dilation: 5 Effacement (%): 80 Cervical Position: Middle Station: -1 Presentation: Vertex Exam by:: Dr. Alvester MorinNewton  Labs: Lab Results  Component Value Date   WBC 8.0 09/15/2015   HGB 12.0 09/15/2015   HCT 34.0* 09/15/2015   MCV 77.3* 09/15/2015   PLT 351 09/15/2015    Patient Active Problem List   Diagnosis Date Noted  . Preterm premature rupture of membranes (PPROM) with onset of labor within 24 hours of rupture in third trimester, antepartum 09/15/2015  . Pre-existing type 2 diabetes mellitus in pregnancy in third trimester   . Premature rupture of membranes   . Preterm premature rupture of membranes (PPROM) with unknown onset of labor 09/03/2015  . Obesity affecting pregnancy, antepartum 04/16/2015  . Preexisting diabetes complicating pregnancy, antepartum 04/15/2015  . Supervision of high risk pregnancy, antepartum 04/15/2015  . Diabetes mellitus type 2, uncontrolled, without complications (HCC) 10/31/2012    Assessment / Plan: 25 y.o. G1P0000 at 5869w1d here for PPROM on 5/22 now IOL with pitocin  Labor: Progressing, continue pit Fetal Wellbeing:  IUPC in place Pain Control:  Epidural Anticipated MOD:  NSVD  Andrea LeatherwoodKelly M Romaine Neville, MD 09/16/2015, 12:22 PM

## 2015-09-16 NOTE — Progress Notes (Signed)
Patient ID: Andrea [name], female   DOB: 12-12-1990, 25 y.o.   MRN: [id number] Labor Progress Note Andrea [name] is a 25 y.o. G1P0000 at 2771w1d presented for PPROM and now undergoing induction of labor  S: s/p epidural with good relief. Some vaginal bleeding  O:  BP 125/56 mmHg  Pulse 82  Temp(Src) 97.6 F (36.4 C) (Axillary)  Resp 16  Ht 5\' 2"  (1.575 m)  Wt 248 lb 8 oz (112.719 kg)  BMI 45.44 kg/m2  SpO2 100%  LMP 01/20/2015 (Exact Date) EFM: 150/mod/+accels, recurrent variables to 80s lasting 1 minute with return to baseline.   CVE: Dilation: 5 Effacement (%): 80 Cervical Position: Middle Station: -1 Presentation: Vertex Exam by:: Dr. Alvester MorinNewton  IUPC placed easily but had return of blood despite placement posteriorly (placenta is anterior) and FSE placed  A&P: 25 y.o. G1P0000 4071w1d with PPROM #Labor: Progressing well. IUPC and FSE placed. Pit was stopped during recurrent variables and position changes were performed.  When reactive for 20 minutes, plan to restart the pitocin .  #Pain: Epidural in place #FWB: Cat II, will start amnioinfusion given deep variables.  #GBS negative  MOD- expect vaginal delivery.   Federico FlakeKimberly Niles Jamone Garrido, MD 11:15 AM

## 2015-09-16 NOTE — Progress Notes (Signed)
Patient ID: Andrea [name], female   DOB: 1990-10-21, 25 y.o.   MRN: [id number] Labor Progress Note Andrea [name] is a 25 y.o. G1P0000 at 7044w1d presented for PPROM and now undergoing induction of labor  S: s/p epidural with persistent left hip pain. Called by RN for FHR tracing. Patient having recurrent variables.   O:  BP 138/82 mmHg  Pulse 95  Temp(Src) 98.5 F (36.9 C) (Axillary)  Resp 16  Ht 5\' 2"  (1.575 m)  Wt 248 lb 8 oz (112.719 kg)  BMI 45.44 kg/m2  SpO2 100%  LMP 01/20/2015 (Exact Date) EFM: 150/mod/+accels, variable decelerations- recurrent and deep with return to baseline but lasting 1 minute and touch 70.  CVE: Dilation: 6 Effacement (%): 80 Cervical Position: Middle Station: 0 Presentation: Vertex Exam by:: Kuper Rennels   Fetal head feels asynclitic.   IUPC#2 placed easily posteriorly- filled with clear fluid.   A&P: 25 y.o. G1P0000 4344w1d with PPROM #Labor: Protracted first stage. Has been 5-6cm since 1300. Patient has been on and off pitocin due to recurrent variables.  Discussed with patient that if we are unable to continue pitocin to help continue labor we may need to proceed to a c-section. Currently still reassured by the good beat to beat variability.  #Pain: Epidural in place #FWB: Cat II- Amnioinfusion not able to continued due to lack of return. Encouraged position changes.  #GBS negative   MOD- Guarded.   Federico FlakeKimberly Niles Keatin Benham, MD 6:08 PM

## 2015-09-16 NOTE — Progress Notes (Signed)
Labor Progress Note Andrea [name] is a 25 y.o. G1P0000 at 437w0d admitted for IOL for PPROM S: No complaint at this time. Pain well controlled with epidural. Some mild range BP's. Denies headache, vision changes or RUQ pain.   O:  BP 140/98 mmHg  Pulse 92  Temp(Src) 98.2 F (36.8 C) (Oral)  Resp 20  Ht 5\' 2"  (1.575 m)  Wt 248 lb 8 oz (112.719 kg)  BMI 45.44 kg/m2  SpO2 99%  LMP 01/20/2015 (Exact Date) EFM: 150/Mod var/no decels  CVE: Dilation: 3.5 Effacement (%): 70 Cervical Position: Middle Station: -2 Presentation: Vertex Exam by:: D Jasso, RN   A&P: 25 y.o. G1P0000 1637w0d admitted for IOL for PPROM. S/p BMZ x2 and Antibiotics. #Labor: pitocin.  #Pain: epidural #FWB: CAT-I #ID: s/p course of Antibiotics for PPROM. GBS negative  Almon Herculesaye T Gonfa, MD 6:22 AM

## 2015-09-16 NOTE — Consult Note (Signed)
Neonatology Note:   Attendance at C-section:    I was asked by Dr. Alvester MorinNewton to attend this C/S at 34 weeks for intolerant of IOL. The mother is a 25 y.o. G1, GBS neg with good prenatal care. ROM ~13 days before delivery, fluid clear. Infant with good tone though spontaneous cry not persistent.  Delayed cord clamping only 30 second.  Brought to warmer and dried and stimulated. HR ~80 without resp effort.  CPAP with breathes initiated with good response in HR.  Resp effort gradually improved. Bulb suctioned moderate amount of fluid out of oropharynx. Fio2 weaned to RA.  Tolerated removal of CPAP for introduction to mom and transport to NICU.  Ap 3/8. Lungs clear to ausc in DR though in need of further recruitment. To NICU for prematurity and RDS.  Father accompanied us and general management discussed.    Dineen Kidavid C. Leary RocaEhrmann, MD

## 2015-09-16 NOTE — Progress Notes (Signed)
Patient ID: Andrea [name], female   DOB: 01-24-1991, 25 y.o.   MRN: [id number] Labor Progress Note Andrea [name] is a 25 y.o. G1P0000 at 4445w1d presented for PPROM and now undergoing induction of labor  S: s/p epidural with good relief. Sleeping. Called by RN for lack of return on IUPC.   O:  BP 113/59 mmHg  Pulse 83  Temp(Src) 98.1 F (36.7 C) (Oral)  Resp 16  Ht 5\' 2"  (1.575 m)  Wt 248 lb 8 oz (112.719 kg)  BMI 45.44 kg/m2  SpO2 100%  LMP 01/20/2015 (Exact Date) EFM: 150/mod/+accels,mild variables, not recurrent now.    CVE: Dilation: 5 Effacement (%): 80 Cervical Position: Middle Station: -1 Presentation: Vertex Exam by:: Dr. Alvester MorinNewton  IUPC#2 placed easily  A&P: 25 y.o. G1P0000 8445w1d with PPROM #Labor: Progressing well. Replaced IUPC. Restart pitocin now given reactive strip.  #Pain: Epidural in place #FWB: Cat II #GBS negative  MOD- expect vaginal delivery.   Federico FlakeKimberly Niles Darrik Richman, MD 1:10 PM

## 2015-09-17 ENCOUNTER — Encounter (HOSPITAL_COMMUNITY): Payer: Self-pay | Admitting: Obstetrics & Gynecology

## 2015-09-17 ENCOUNTER — Encounter (HOSPITAL_COMMUNITY): Payer: Self-pay

## 2015-09-17 DIAGNOSIS — E1165 Type 2 diabetes mellitus with hyperglycemia: Secondary | ICD-10-CM

## 2015-09-17 DIAGNOSIS — O34219 Maternal care for unspecified type scar from previous cesarean delivery: Secondary | ICD-10-CM

## 2015-09-17 DIAGNOSIS — E669 Obesity, unspecified: Secondary | ICD-10-CM

## 2015-09-17 DIAGNOSIS — Z3A34 34 weeks gestation of pregnancy: Secondary | ICD-10-CM

## 2015-09-17 DIAGNOSIS — O42113 Preterm premature rupture of membranes, onset of labor more than 24 hours following rupture, third trimester: Secondary | ICD-10-CM

## 2015-09-17 DIAGNOSIS — O2412 Pre-existing diabetes mellitus, type 2, in childbirth: Secondary | ICD-10-CM

## 2015-09-17 DIAGNOSIS — Z794 Long term (current) use of insulin: Secondary | ICD-10-CM

## 2015-09-17 DIAGNOSIS — Z98891 History of uterine scar from previous surgery: Secondary | ICD-10-CM

## 2015-09-17 DIAGNOSIS — O99214 Obesity complicating childbirth: Secondary | ICD-10-CM

## 2015-09-17 LAB — CBC
HCT: 32.4 % — ABNORMAL LOW (ref 36.0–46.0)
Hemoglobin: 11.2 g/dL — ABNORMAL LOW (ref 12.0–15.0)
MCH: 26.9 pg (ref 26.0–34.0)
MCHC: 34.6 g/dL (ref 30.0–36.0)
MCV: 77.7 fL — ABNORMAL LOW (ref 78.0–100.0)
PLATELETS: 320 10*3/uL (ref 150–400)
RBC: 4.17 MIL/uL (ref 3.87–5.11)
RDW: 13.9 % (ref 11.5–15.5)
WBC: 19.7 10*3/uL — ABNORMAL HIGH (ref 4.0–10.5)

## 2015-09-17 LAB — GLUCOSE, CAPILLARY
GLUCOSE-CAPILLARY: 142 mg/dL — AB (ref 65–99)
GLUCOSE-CAPILLARY: 77 mg/dL (ref 65–99)
Glucose-Capillary: 176 mg/dL — ABNORMAL HIGH (ref 65–99)

## 2015-09-17 MED ORDER — ZOLPIDEM TARTRATE 5 MG PO TABS: 5.0000 mg | ORAL_TABLET | Freq: Every evening | ORAL | Status: DC | PRN

## 2015-09-17 MED ORDER — IBUPROFEN 600 MG PO TABS
600.0000 mg | ORAL_TABLET | Freq: Four times a day (QID) | ORAL | Status: DC
  Administered 2015-09-17 – 2015-09-19 (×8): 600 mg via ORAL
  Filled 2015-09-17 (×8): qty 1

## 2015-09-17 MED ORDER — TETANUS-DIPHTH-ACELL PERTUSSIS 5-2.5-18.5 LF-MCG/0.5 IM SUSP: 0.5000 mL | Freq: Once | INTRAMUSCULAR | Status: DC

## 2015-09-17 MED ORDER — INSULIN GLARGINE 100 UNIT/ML ~~LOC~~ SOLN
40.0000 [IU] | Freq: Every day | SUBCUTANEOUS | Status: DC
  Administered 2015-09-18 – 2015-09-19 (×2): 40 [IU] via SUBCUTANEOUS
  Filled 2015-09-17 (×3): qty 0.4

## 2015-09-17 MED ORDER — SIMETHICONE 80 MG PO CHEW
80.0000 mg | CHEWABLE_TABLET | Freq: Three times a day (TID) | ORAL | Status: DC
  Administered 2015-09-17 – 2015-09-19 (×5): 80 mg via ORAL
  Filled 2015-09-17 (×6): qty 1

## 2015-09-17 MED ORDER — SIMETHICONE 80 MG PO CHEW: 80.0000 mg | CHEWABLE_TABLET | ORAL | Status: DC | PRN

## 2015-09-17 MED ORDER — DIPHENHYDRAMINE HCL 25 MG PO CAPS: 25.0000 mg | ORAL_CAPSULE | Freq: Four times a day (QID) | ORAL | Status: DC | PRN

## 2015-09-17 MED ORDER — COCONUT OIL OIL: 1.0000 "application " | TOPICAL_OIL | Status: DC | PRN

## 2015-09-17 MED ORDER — LACTATED RINGERS IV SOLN
INTRAVENOUS | Status: DC
  Administered 2015-09-17: 01:00:00 via INTRAVENOUS

## 2015-09-17 MED ORDER — PRENATAL MULTIVITAMIN CH
1.0000 | ORAL_TABLET | Freq: Every day | ORAL | Status: DC
  Administered 2015-09-17 – 2015-09-18 (×2): 1 via ORAL
  Filled 2015-09-17 (×2): qty 1

## 2015-09-17 MED ORDER — INSULIN ASPART 100 UNIT/ML ~~LOC~~ SOLN
6.0000 [IU] | Freq: Three times a day (TID) | SUBCUTANEOUS | Status: DC
Start: 2015-09-17 — End: 2015-09-18
  Administered 2015-09-17 (×2): 6 [IU] via SUBCUTANEOUS

## 2015-09-17 MED ORDER — OXYCODONE-ACETAMINOPHEN 5-325 MG PO TABS
2.0000 | ORAL_TABLET | ORAL | Status: DC | PRN
  Administered 2015-09-18 – 2015-09-19 (×3): 2 via ORAL
  Filled 2015-09-17 (×4): qty 2

## 2015-09-17 MED ORDER — OXYCODONE-ACETAMINOPHEN 5-325 MG PO TABS
1.0000 | ORAL_TABLET | ORAL | Status: DC | PRN
Start: 2015-09-17 — End: 2015-09-19

## 2015-09-17 MED ORDER — INSULIN ASPART 100 UNIT/ML ~~LOC~~ SOLN
0.0000 [IU] | Freq: Three times a day (TID) | SUBCUTANEOUS | Status: DC
  Administered 2015-09-17: 3 [IU] via SUBCUTANEOUS

## 2015-09-17 MED ORDER — SIMETHICONE 80 MG PO CHEW
80.0000 mg | CHEWABLE_TABLET | ORAL | Status: DC
  Administered 2015-09-18 – 2015-09-19 (×2): 80 mg via ORAL
  Filled 2015-09-17 (×2): qty 1

## 2015-09-17 MED ORDER — OXYTOCIN 40 UNITS IN LACTATED RINGERS INFUSION - SIMPLE MED: 2.5000 [IU]/h | INTRAVENOUS | Status: AC

## 2015-09-17 MED ORDER — MENTHOL 3 MG MT LOZG: 1.0000 | LOZENGE | OROMUCOSAL | Status: DC | PRN

## 2015-09-17 MED ORDER — SENNOSIDES-DOCUSATE SODIUM 8.6-50 MG PO TABS
2.0000 | ORAL_TABLET | ORAL | Status: DC
  Administered 2015-09-18 – 2015-09-19 (×2): 2 via ORAL
  Filled 2015-09-17 (×2): qty 2

## 2015-09-17 NOTE — Progress Notes (Signed)
Patient currently in NICU visiting her infant. Pt stated that she is not returning to her room soon. Unable to give patient her medications or do her assessment at this time.

## 2015-09-17 NOTE — Progress Notes (Signed)
CSW acknowledges NICU admission.    Patient screened out for psychosocial assessment since none of the following apply:  Psychosocial stressors documented in mother or baby's chart  Gestation less than 32 weeks  Code at delivery   Infant with anomalies  Please contact the Clinical Social Worker if specific needs arise, or by MOB's request.       

## 2015-09-17 NOTE — Progress Notes (Signed)
Andrea [name] is a 25 y.o. G1P0000 at 436w2d by ultrasound admitted for PPROM with IOL at 34 weeks secondary to PPROM. IOL started on 6/4. The patient progressed through labor and was 4-5 cm. She progressed to 6 cm at approximately 1300. The infant had recurrent deep variables and required two pitocin breaks. A third attempt was made to start pitocin but the infant had late decelerations and minimal variability and the patient continue to remote from delivery. The decision was made to proceed with cesarean section for fetal intolerance of labor/fetal indications.   The risks of cesarean section discussed with the patient included but were not limited to: bleeding which may require transfusion or reoperation; infection which may require antibiotics; injury to bowel, bladder, ureters or other surrounding organs; injury to the fetus; need for additional procedures including hysterectomy in the event of a life-threatening hemorrhage; placental abnormalities wth subsequent pregnancies, incisional problems, thromboembolic phenomenon and other postoperative/anesthesia complications. The patient concurred with the proposed plan, giving informed written consent for the procedure.   Federico FlakeKimberly Niles Newton, MD , MPH, ABFM Family Medicine, OB Fellow [facility]

## 2015-09-17 NOTE — Op Note (Signed)
Andrea [name] PROCEDURE DATE: 09/17/2015  PREOPERATIVE DIAGNOSES: Intrauterine pregnancy at [redacted]w[redacted]d weeks gestation; non-reassuring fetal status  POSTOPERATIVE DIAGNOSES: The same  PROCEDURE: Primary Low Transverse Cesarean Section  SURGEON:     Andrea Lincoln MD    Andrea Flake, MD   INDICATIONS: Andrea [name] is a 25 y.o. G1P0000 at [redacted]w[redacted]d here for cesarean section secondary to the indications listed under preoperative diagnoses; please see preoperative note for further details.  The risks of cesarean section were discussed with the patient including but were not limited to: bleeding which may require transfusion or reoperation; infection which may require antibiotics; injury to bowel, bladder, ureters or other surrounding organs; injury to the fetus; need for additional procedures including hysterectomy in the event of a life-threatening hemorrhage; placental abnormalities wth subsequent pregnancies, incisional problems, thromboembolic phenomenon and other postoperative/anesthesia complications.   The patient concurred with the proposed plan, giving informed written consent for the procedure.    FINDINGS:  Viable female infant in cephalic presentation- evidence of asynclitic lie. Apgars 3 and 8.  Clear amniotic fluid.  Intact placenta, three vessel cord.  Normal uterus, fallopian tubes and ovaries bilaterally.  ANESTHESIA: Epidural INTRAVENOUS FLUIDS: 600 ml ESTIMATED BLOOD LOSS: 600 ml URINE OUTPUT:  725 ml SPECIMENS: Placenta sent to pathology COMPLICATIONS: None immediate  PROCEDURE IN DETAIL:  The patient preoperatively received intravenous antibiotics and had sequential compression devices applied to her lower extremities.  She was then taken to the operating room where the epidural anesthesia was dosed up to surgical level and was found to be adequate. She was then placed in a dorsal supine position with a leftward tilt, and prepped and draped in a sterile manner.  A foley  catheter was in placeand attached to constant gravity.  After an adequate timeout was performed, a Pfannenstiel skin incision was made with scalpel and carried through to the underlying layer of fascia. The fascia was incised in the midline, and this incision was extended bilaterally using the Mayo scissors.  Kocher clamps were applied to the superior aspect of the fascial incision and the underlying rectus muscles were dissected off bluntly. A similar process was carried out on the inferior aspect of the fascial incision. The rectus muscles were separated in the midline bluntly but then necessitated transection with the bovie approximately 3 cm on either side.  The peritoneum was entered bluntly. Attention was turned to the lower uterine segment where a low transverse hysterotomy was made with a scalpel and extended bilaterally bluntly.  The infant was successfully delivered, the cord was clamped and cut and the infant was handed over to awaiting neonatology team. Uterine massage was then administered, and the placenta delivered intact with a three-vessel cord. The uterus was then cleared of clot and debris.  The hysterotomy was closed with 0 Vicryl in a running locked fashion, and an imbricating layer was also placed with 0 Vicryl. Figure-of-eight serosal stitches were placed to help with hemostasis.  The pelvis was cleared of all clot and debris. Hemostasis was confirmed on all surfaces.  The peritoneum and the muscles were reapproximated using 0 Vicryl interrupted stitches. The fascia was then closed using 0 Vicryl in a running fashion.  The subcutaneous layer was irrigated and reapproximated with 2-0 plain gut interrupted stitches.  The skin was closed with a 4-0 Vicryl subcuticular stitch. PICO dressing was placed over the wound in the OR. The patient tolerated the procedure well. Sponge, lap, instrument and needle counts were correct x 2.  She was taken to the recovery room in stable condition.   Andrea FlakeKimberly  Niles Quatisha Zylka, MD , MPH, ABFM Family Medicine, OB Fellow [facility]

## 2015-09-17 NOTE — Progress Notes (Signed)
Patient encouraged to order dinner and reminded to call nurse for her CBG check. Patient reminded of the importance of eating a carb modified diet.

## 2015-09-17 NOTE — Addendum Note (Signed)
Addendum  created 09/17/15 [id number] by Graciela HusbandsWynn O Jaymon Dudek, CRNA   Modules edited: Notes Section   Notes Section:  File: [id number]

## 2015-09-17 NOTE — Progress Notes (Signed)
Post Partum Day 1 Subjective:  Andrea [name] is a 25 y.o. G1P0000 354w2d s/p pLTCS for fetal indications.  No acute events overnight.  Pt denies problems with ambulating, voiding or po intake.  She denies nausea or vomiting.  Pain is well controlled.  She has had flatus.  Lochia Moderate.  Plan for birth control is undecided.  Method of Feeding: breast  Objective: Blood pressure 114/66, pulse 83, temperature 98.7 F (37.1 C), temperature source Oral, resp. rate 16, height 5\' 2"  (1.575 m), weight 248 lb 8 oz (112.719 kg), last menstrual period 01/20/2015, SpO2 96 %.  Physical Exam:  General: alert, cooperative and no distress Lochia:normal flow Chest: normal WOB Heart: Regular rate Abdomen: +BS, soft, mild TTP (appropriate). Incision with PICO dressing in place Uterine Fundus: firm DVT Evaluation: No evidence of DVT seen on physical exam. Extremities: No edema   Recent Labs  09/15/15 0100 09/17/15 0541  HGB 12.0 11.2*  HCT 34.0* 32.4*    Assessment/Plan: ASSESSMENT: Andrea [name] is a 25 y.o. G1P0000 7554w2d s/p pLTCS  Plan for discharge tomorrow vs next Continue routine PP care Breastfeeding support PRN  #T2DM: Restarted 2/3 prior to pregnancy doses of Lantus with meal time coverage.    LOS: 14 days   Federico FlakeKimberly Niles Vincente Asbridge 09/17/2015, 7:34 AM

## 2015-09-17 NOTE — Brief Op Note (Signed)
09/03/2015 - 09/16/2015  1:58 AM  PATIENT:  Andrea [name]  25 y.o. female  PRE-OPERATIVE DIAGNOSIS:  fetal intolerance of labor; fetal indications   POST-OPERATIVE DIAGNOSIS:  fetal intolerance of labor; fetal indications   PROCEDURE:  Procedure(s): CESAREAN SECTION (N/A)  SURGEON:  Surgeon(s) and Role:    * Lesly DukesKelly H Leggett, MD - Primary    * Federico FlakeKimberly Niles Newton, MD - Assisting  EBL:  Total I/O In: 600 [I.V.:600] Out: 1325 [Urine:725; Blood:600]

## 2015-09-17 NOTE — Anesthesia Postprocedure Evaluation (Signed)
Anesthesia Post Note  Patient: Andrea [name]  Procedure(s) Performed: Procedure(s) (LRB): CESAREAN SECTION (N/A)  Patient location during evaluation: PACU Anesthesia Type: Epidural Level of consciousness: awake and alert Pain management: pain level controlled Vital Signs Assessment: post-procedure vital signs reviewed and stable Respiratory status: spontaneous breathing, nonlabored ventilation and respiratory function stable Cardiovascular status: stable Postop Assessment: no headache, no backache and epidural receding Anesthetic complications: no     Last Vitals:  Filed Vitals:   09/16/15 2330 09/17/15 0031  BP: 138/85 121/75  Pulse: 79 90  Temp: 37.1 C   Resp: 15 18    Last Pain:  Filed Vitals:   09/17/15 0107  PainSc: 0-No pain   Pain Goal: Patients Stated Pain Goal: 2 (09/15/15 1534)               Reino KentJudd, Zaryan Yakubov J

## 2015-09-17 NOTE — Lactation Note (Signed)
This note was copied from a baby's chart. Lactation Consultation Note  Patient Name: Andrea Irving BurtonOlivia Wharton [id number]'WToday's Date: 09/17/2015 Reason for consult: Initial assessment;NICU baby NICU baby 220 hours old. Made several attempts to see MOB today in order to begin her use of DEBP. However, mom stayed in NICU at baby's bedside most of the day. Visited MOB in NICU and enc her to begin use of DEBP, but mom holding baby STS and did not want to stop. Enc [facility] to [facility] assistance with use of DEBP, but mom did not call. Baby's NICU nurse called this LC and enc mom to meet this LC in MOB's room to begin using DEBP and mom complied.  Mom reports that she was given a manual pump to use overnight--after delivery, but was not offered a DEBP. Set mom up with DEBP and assisted with pumping. Enc mom to pump every 2-3 hours for 15 minutes followed by hand expression. Mom able to return-demonstrate hand expression with colostrum present. Assisted mom with colostrum collection and enc mom to take to NICU. Mom given NICU booklet with review including EBM storage guidelines. Mom given [facility] brochure with review and she is aware of OP/BFSG and LC phone line assistance after D/C. Mom aware of pumping rooms in the NICU. Mom gave [facility] and [facility]. Enc mom to call for assistance as needed.   Maternal Data Has patient been taught Hand Expression?: Yes Does the patient have breastfeeding experience prior to this delivery?: No  Feeding Feeding Type: Formula Length of feed: 30 min  LATCH Score/Interventions                      Lactation Tools Discussed/Used Pump Review: Setup, frequency, and cleaning;Milk Storage Initiated by:: JW Date initiated:: 09/17/15   Consult Status Consult Status: Follow-up Date: 09/18/15 Follow-up type: In-patient    [name], Andrea [name] 09/17/2015, 5:25 PM

## 2015-09-17 NOTE — Anesthesia Postprocedure Evaluation (Signed)
Anesthesia Post Note  Patient: Andrea [name]  Procedure(s) Performed: Procedure(s) (LRB): CESAREAN SECTION (N/A)  Patient location during evaluation: [facility] Unit Anesthesia Type: Epidural Level of consciousness: awake and alert and oriented Pain management: satisfactory to patient Vital Signs Assessment: post-procedure vital signs reviewed and stable Respiratory status: spontaneous breathing and nonlabored ventilation Cardiovascular status: blood pressure returned to baseline Postop Assessment: no headache, no backache, epidural receding, adequate PO intake, patient able to bend at knees and no signs of nausea or vomiting Anesthetic complications: no     Last Vitals:  Filed Vitals:   09/17/15 0031 09/17/15 0435  BP: 121/75 114/66  Pulse: 90 83  Temp:  37.1 C  Resp: 18 16    Last Pain:  Filed Vitals:   09/17/15 0444  PainSc: Asleep   Pain Goal: Patients Stated Pain Goal: 2 (09/15/15 1534)               Arzell Mcgeehan

## 2015-09-18 ENCOUNTER — Ambulatory Visit (HOSPITAL_COMMUNITY)
Admission: RE | Admit: 2015-09-18 | Payer: Medicaid Other | Source: Ambulatory Visit | Attending: Obstetrics and Gynecology | Admitting: Obstetrics and Gynecology

## 2015-09-18 LAB — GLUCOSE, CAPILLARY
GLUCOSE-CAPILLARY: 111 mg/dL — AB (ref 65–99)
GLUCOSE-CAPILLARY: 84 mg/dL (ref 65–99)

## 2015-09-18 NOTE — Lactation Note (Signed)
This note was copied from a baby's chart. Lactation Consultation Note  Patient Name: Boy Irving BurtonOlivia Wharton ZOXWR'UToday's Date: 09/18/2015 Reason for consult: Follow-up assessment;NICU baby  NICU baby 1644 hours old. Mom reports that she pumped once last night, and once early this morning. Mom states that she has taken EBM to NICU, and she is trying to pump more often. Enc mom to pump every 2-3 hours for 8 times/24 hours followed by hand expression. Reiterated supply and demand and the need to keep pumping. Enc mom to call for assistance as needed.  Maternal Data    Feeding Feeding Type: Formula Length of feed: 30 min  LATCH Score/Interventions                      Lactation Tools Discussed/Used     Consult Status Consult Status: PRN    [name], Audryna Wendt 09/18/2015, 4:50 PM

## 2015-09-18 NOTE — Progress Notes (Deleted)
Post Partum Day 2 Subjective:  Andrea [name] is a 25 y.o. G2P0101 7058w1d s/p pLTCS.  No acute events overnight.  Pt denies problems with ambulating, voiding or po intake.  She denies nausea or vomiting.  Pain is well controlled.  She has had flatus.  Lochia Minimal.  Plan for birth control is no method.  Method of Feeding: Breast.  Objective: Blood pressure 133/79, pulse 83, temperature 98 F (36.7 C), temperature source Oral, resp. rate 18, height 5\' 2"  (1.575 m), weight 112.719 kg (248 lb 8 oz), last menstrual period 01/20/2015, SpO2 100 %, unknown if currently breastfeeding.  Physical Exam:  General: alert, cooperative and no distress Chest: normal WOB Heart: Regular rate Abdomen: +BS, soft, mild TTP (appropriate) DVT Evaluation: No evidence of DVT seen on physical exam.   Recent Labs  09/17/15 0541  HGB 11.2*  HCT 32.4*    Assessment/Plan:  ASSESSMENT: Andrea [name] is a 25 y.o. G2P0101 6058w1d s/p pLTCS.  Blood glucose stable with last reading at 111.  Currently on Insulin.   Plan for discharge tomorrow Continue routine PP care Breastfeeding support PRN  LOS: 15 days   Jasiel Apachito 09/18/2015, 7:53 AM

## 2015-09-18 NOTE — Progress Notes (Signed)
POSTPARTUM PROGRESS NOTE  Post Partum Day 2 Subjective:  Andrea [name] is a 25 y.o. G2P0101 2070w1d s/p pltcs.  No acute events overnight.  Pt denies problems with ambulating, voiding or po intake.  She denies nausea or vomiting.  Pain is well controlled.  She has had flatus. She has not had bowel movement.  Lochia Small.   Objective: Blood pressure 133/79, pulse 83, temperature 98 F (36.7 C), temperature source Oral, resp. rate 18, height 5\' 2"  (1.575 m), weight 248 lb 8 oz (112.719 kg), last menstrual period 01/20/2015, SpO2 100 %, unknown if currently breastfeeding.  Physical Exam:  General: alert, cooperative and no distress Lochia:normal flow Chest: CTAB Heart: RRR no m/r/g Abdomen: +BS, soft, nontender, incision c/d/i Uterine Fundus: firm,  DVT Evaluation: No calf swelling or tenderness Extremities: trace edema   Recent Labs  09/17/15 0541  HGB 11.2*  HCT 32.4*    Assessment/Plan:  ASSESSMENT: Andrea [name] is a 25 y.o. G2P0101 7670w1d s/p pltcs, doing well. D/c tomorrow as baby in nicu. Will stop mealtime insulin and continue pre-preg lantus 40 qhs and check fasting glucose tomorrow    LOS: 15 days   Silvano Bilisoah B Derron Pipkins 09/18/2015, 7:41 AM

## 2015-09-19 LAB — GLUCOSE, CAPILLARY: Glucose-Capillary: 78 mg/dL (ref 65–99)

## 2015-09-19 LAB — GLUCOSE, RANDOM: Glucose, Bld: 60 mg/dL — ABNORMAL LOW (ref 65–99)

## 2015-09-19 MED ORDER — SENNOSIDES-DOCUSATE SODIUM 8.6-50 MG PO TABS: 2.0000 | ORAL_TABLET | ORAL | Status: DC

## 2015-09-19 MED ORDER — SIMETHICONE 80 MG PO CHEW: 80.0000 mg | CHEWABLE_TABLET | Freq: Three times a day (TID) | ORAL | Status: DC

## 2015-09-19 MED ORDER — OXYCODONE-ACETAMINOPHEN 5-325 MG PO TABS: 1.0000 | ORAL_TABLET | ORAL | Status: DC | PRN

## 2015-09-19 MED ORDER — IBUPROFEN 600 MG PO TABS: 600.0000 mg | ORAL_TABLET | Freq: Four times a day (QID) | ORAL | Status: DC

## 2015-09-19 MED ORDER — INSULIN GLARGINE 100 UNIT/ML ~~LOC~~ SOLN: 35.0000 [IU] | Freq: Every day | SUBCUTANEOUS | Status: DC

## 2015-09-19 NOTE — Discharge Instructions (Signed)
Cesarean Delivery, Care After  Refer to this sheet in the next few weeks. These instructions provide you with information on caring for yourself after your procedure. Your health care provider may also give you specific instructions. Your treatment has been planned according to current medical practices, but problems sometimes occur. Call your health care provider if you have any problems or questions after you go home.  HOME CARE INSTRUCTIONS   Only take over-the-counter or prescription medications as directed by your health care provider.   Do not drink alcohol, especially if you are breastfeeding or taking medication to relieve pain.   Do not chew or smoke tobacco.   Continue to use good perineal care. Good perineal care includes:    Wiping your perineum from front to back.    Keeping your perineum clean.   Check your surgical cut (incision) daily for increased redness, drainage, swelling, or separation of skin.   Clean your incision gently with soap and water every day, and then pat it dry. If your health care provider says it is okay, leave the incision uncovered. Use a bandage (dressing) if the incision is draining fluid or appears irritated. If the adhesive strips across the incision do not fall off within 7 days, carefully peel them off.   Hug a pillow when coughing or sneezing until your incision is healed. This helps to relieve pain.   Do not use tampons or douche until your health care provider says it is okay.   Shower, wash your hair, and take tub baths as directed by your health care provider.   Wear a well-fitting bra that provides breast support.   Limit wearing support panties or control-top hose.   Drink enough fluids to keep your urine clear or pale yellow.   Eat high-fiber foods such as whole grain cereals and breads, brown rice, beans, and fresh fruits and vegetables every day. These foods may help prevent or relieve constipation.   Resume activities such as climbing stairs,  driving, lifting, exercising, or traveling as directed by your health care provider.   Talk to your health care provider about resuming sexual activities. This is dependent upon your risk of infection, your rate of healing, and your comfort and desire to resume sexual activity.   Try to have someone help you with your household activities and your newborn for at least a few days after you leave the hospital.   Rest as much as possible. Try to rest or take a nap when your newborn is sleeping.   Increase your activities gradually.   Keep all of your scheduled postpartum appointments. It is very important to keep your scheduled follow-up appointments. At these appointments, your health care provider will be checking to make sure that you are healing physically and emotionally.  SEEK MEDICAL CARE IF:    You are passing large clots from your vagina. Save any clots to show your health care provider.   You have a foul smelling discharge from your vagina.   You have trouble urinating.   You are urinating frequently.   You have pain when you urinate.   You have a change in your bowel movements.   You have increasing redness, pain, or swelling near your incision.   You have pus draining from your incision.   Your incision is separating.   You have painful, hard, or reddened breasts.   You have a severe headache.   You have blurred vision or see spots.   You feel sad   or depressed.   You have thoughts of hurting yourself or your newborn.   You have questions about your care, the care of your newborn, or medications.   You are dizzy or light-headed.   You have a rash.   You have pain, redness, or swelling at the site of the removed intravenous access (IV) tube.   You have nausea or vomiting.   You stopped breastfeeding and have not had a menstrual period within 12 weeks of stopping.   You are not breastfeeding and have not had a menstrual period within 12 weeks of delivery.   You have a fever.  SEEK  IMMEDIATE MEDICAL CARE IF:   You have persistent pain.   You have chest pain.   You have shortness of breath.   You faint.   You have leg pain.   You have stomach pain.   Your vaginal bleeding saturates 2 or more sanitary pads in 1 hour.  MAKE SURE YOU:    Understand these instructions.   Will watch your condition.   Will get help right away if you are not doing well or get worse.     This information is not intended to replace advice given to you by your health care provider. Make sure you discuss any questions you have with your health care provider.     Document Released: 12/20/2001 Document Revised: 04/20/2014 Document Reviewed: 11/25/2011  Elsevier Interactive Patient Education 2016 [employer]

## 2015-09-19 NOTE — Progress Notes (Signed)
Patient discharged to home. Discharge paperwork/education reviewed with nurse.

## 2015-09-19 NOTE — Lactation Note (Signed)
This note was copied from a baby's chart. Lactation Consultation Note  Patient Name: Boy Myriam Brandhorst WYOVZ'C Date: 09/19/2015 Reason for consult: Follow-up assessment;NICU baby;Infant < 6lbs;Late preterm infant   Met with mom in NICU at infants bedside. She is to be d/c home today. She reports she is not pumping regularly and is starting to see colostrum. Reviewed supply and demand and engorgement prevention and enc mom to pump every 2-3 hours for 15 minutes on initiate setting with DEBP. Mom has a manual pump to take home, she is not sure if she can rent pump today, she needs to talk to her Boyfriend. Advised her that DEBP is recommended for pumping due to NICU infant. Talked to mom about pumps in NICU and advised her to use them when she is visiting infant. Mom has an appointment with [facility] tomorrow at 2 pm. Advised mom to have nurses call if she desires breast pump rental today.   Mom's breast are feeling fuller and she is getting colostrum from right breast. Reviewed engorgement prevention/treatment. Mom has [facility] and is aware of IP/OP Services, BF support Groups and [place] phone #. Will follow up prn.      Maternal Data Formula Feeding for Exclusion: No Has patient been taught Hand Expression?: Yes  Feeding Feeding Type: Breast Milk with Formula added Nipple Type: Slow - flow Length of feed: 30 min  LATCH Score/Interventions                      Lactation Tools Discussed/Used WIC Program: Yes Pump Review: Setup, frequency, and cleaning;Milk Storage   Consult Status Consult Status: PRN Follow-up type: Call as needed    Donn Pierini 09/19/2015, 1:51 PM

## 2015-09-19 NOTE — Discharge Summary (Signed)
OB Discharge Summary     Patient Name: Andrea [name] DOB: 11/20/1990 MRN: [id number]  Date of admission: 09/03/2015 Delivering MD: Caren Macadam   Date of discharge: 09/19/2015  Admitting diagnosis: 26WKS,LEAKING Intrauterine pregnancy: [redacted]w[redacted]d    Secondary diagnosis:  Active Problems:   Diabetes mellitus type 2, uncontrolled, without complications ([facility]   Preexisting diabetes complicating pregnancy, antepartum   Supervision of high risk pregnancy, antepartum   Obesity affecting pregnancy, antepartum   Preterm premature rupture of membranes (PPROM) with unknown onset of labor   Pre-existing type 2 diabetes mellitus in pregnancy in third trimester   Premature rupture of membranes   Preterm premature rupture of membranes (PPROM) with onset of labor within 24 hours of rupture in third trimester, antepartum   Status post cesarean section  Additional problems: None     Discharge diagnosis: Preterm Pregnancy Delivered and Type 2 DM                                                                                                Post partum procedures:None  Augmentation: None  Complications: RZLD>35hours  Hospital course:  Induction of Labor With Cesarean Section  25y.o. yo G2P0101 at 379w1das admitted to the hospital 09/03/2015 for induction of labor. Patient had a labor course significant for NRFHT. The patient went for cesarean section due to Non-Reassuring FHR, and delivered a Viable infant,membrane Rupture Time/Date: )1:00 PM ,09/02/2015   _0  of operation can be found in separate operative Note.  Patient had an uncomplicated postpartum course. She is ambulating, tolerating a regular diet, passing flatus, and urinating well.  Patient is discharged home in stable condition on 09/19/2015.                                     Physical exam  Filed Vitals:   09/18/15 0631 09/18/15 1707 09/19/15 0120 09/19/15 0535  BP: 133/79 130/64 124/81 109/67  Pulse: 83 88 88 79   Temp: 98 F (36.7 C) 98.3 F (36.8 C) 98.8 F (37.1 C) 98 F (36.7 C)  TempSrc: Oral Oral Oral Oral  Resp: _1 Height:      Weight:      SpO2: 100%  100% 100%   General: alert, cooperative and no distress Lochia: appropriate Uterine Fundus: firm Incision: Healing well with no significant drainage, No significant erythema, Dressing is clean, dry, and intact DVT Evaluation: No evidence of DVT seen on physical exam. Labs: Lab Results  Component Value Date   WBC 19.7* 09/17/2015   HGB 11.2* 09/17/2015   HCT 32.4* 09/17/2015   MCV 77.7* 09/17/2015   PLT 320 09/17/2015   CMP Latest Ref Rng 09/19/2015  Glucose 65 - 99 mg/dL 60(L)  BUN 6 - 20 mg/dL -  Creatinine 0.44 - 1.00 mg/dL -  Sodium 135 - 145 mmol/L -  Potassium 3.5 - 5.1 mmol/L -  Chloride 101 - 111 mmol/L -  CO2 22 - 32 mmol/L -  Calcium 8.9 -  10.3 mg/dL -  Total Protein 6.1 - 8.1 g/dL -  Total Bilirubin 0.2 - 1.2 mg/dL -  Alkaline Phos 33 - 115 U/L -  AST 10 - 30 U/L -  ALT 6 - 29 U/L -    Discharge instruction: per After Visit Summary and "Baby and Me Booklet".  After visit meds:    Medication List    STOP taking these medications        aspirin EC 81 MG tablet     cephALEXin 500 MG capsule  Commonly known as:  KEFLEX      TAKE these medications        glucose blood test strip  Use as instructed     ibuprofen 600 MG tablet  Commonly known as:  ADVIL,MOTRIN  Take 1 tablet (600 mg total) by mouth every 6 (six) hours.     insulin glargine 100 UNIT/ML injection  Commonly known as:  LANTUS  Inject 0.35 mLs (35 Units total) into the skin at bedtime.     [place] w/Device Kit  Use as advised     ONE TOUCH LANCETS Misc  Check sugars 3x a day     onetouch ultrasoft lancets  Use as instructed     oxyCODONE-acetaminophen 5-325 MG tablet  Commonly known as:  PERCOCET/ROXICET  Take 1-2 tablets by mouth every 4 (four) hours as needed for severe pain.     Prenatal Vitamins  0.8 MG tablet  Take 1 tablet by mouth daily.     senna-docusate 8.6-50 MG tablet  Commonly known as:  Senokot-S  Take 2 tablets by mouth daily.     simethicone 80 MG chewable tablet  Commonly known as:  MYLICON  Chew 1 tablet (80 mg total) by mouth 3 (three) times daily after meals.        Diet: low salt diet  Activity: Advance as tolerated. Pelvic rest for 6 weeks.   Outpatient follow up:1 week Follow up Appt:No future appointments. Follow up Visit:No Follow-up on file.  Postpartum contraception: Undecided  Newborn Data: Live born female  Birth Weight: 4 lb 4.8 oz (1950 g) APGAR: 3, 8  Baby Feeding: Bottle and Breast Disposition:NICU   09/19/2015 Melany Guernsey, MD  OB FELLOW DISCHARGE ATTESTATION  I have seen and examined this patient and agree with above documentation in the resident's note.   Desma Maxim, MD 1:43 PM

## 2015-09-19 NOTE — Lactation Note (Signed)
This note was copied from a baby's chart. Lactation Consultation Note  Patient Name: Andrea [name] DJTTS'V Date: 09/19/2015 Reason for consult: Follow-up assessment;NICU baby;Infant < 6lbs;Late preterm infant   Mom declined rental of DEBP. Showed her how to use hand pump in kit and how to manually double pump with pump kit parts. Again enc mom to use DEBP while in NICU. Mom voiced understanding. Follow up prn   Maternal Data Formula Feeding for Exclusion: No Has patient been taught Hand Expression?: Yes  Feeding Feeding Type: Breast Milk with Formula added Nipple Type: Slow - flow Length of feed: 30 min  LATCH Score/Interventions                      Lactation Tools Discussed/Used WIC Program: Yes Pump Review: Setup, frequency, and cleaning;Milk Storage   Consult Status Consult Status: PRN Follow-up type: Call as needed    Donn Pierini 09/19/2015, 2:12 PM

## 2015-09-21 NOTE — H&P (Signed)
Andrea [name] MRN:  [id number] DOB/SEX:  [birth year]/female  CHIEF COMPLAINT:  Painful left foot  HISTORY: Patient is a 25 y.o. female presented with a history of pain in the left foot. Onset of symptoms was abrupt starting a few days ago with gradually worsening course since that time. Patient has been treated conservatively with over-the-counter NSAIDs and activity modification. Patient currently rates pain in the foot at 10 out of 10 with activity. There is pain at night.  PAST MEDICAL HISTORY: Patient Active Problem List   Diagnosis Date Noted  . Status post cesarean section 09/17/2015  . Preterm premature rupture of membranes (PPROM) with onset of labor within 24 hours of rupture in third trimester, antepartum 09/15/2015  . Pre-existing type 2 diabetes mellitus in pregnancy in third trimester   . Premature rupture of membranes   . Preterm premature rupture of membranes (PPROM) with unknown onset of labor 09/03/2015  . Obesity affecting pregnancy, antepartum 04/16/2015  . Preexisting diabetes complicating pregnancy, antepartum 04/15/2015  . Supervision of high risk pregnancy, antepartum 04/15/2015  . Diabetes mellitus type 2, uncontrolled, without complications ([facility]) 10/31/2012   Past Medical History  Diagnosis Date  . Diabetes mellitus     type 2 on insulin  . Chicken pox   . Anemia     as a teenager   Past Surgical History  Procedure Laterality Date  . No past surgeries    . Foreign body removal Left 08/28/2015    Procedure: REMOVAL FOREIGN BODY foot;  Surgeon: Dannielle HuhSteve Lucey, MD;  Location: [facility] OR;  Service: Orthopedics;  Laterality: Left;  regional block  . Cesarean section N/A 09/16/2015    Procedure: CESAREAN SECTION;  Surgeon: Lesly DukesKelly H Leggett, MD;  [facility];  Service: Obstetrics;  Laterality: N/A;     MEDICATIONS:   No prescriptions prior to admission    ALLERGIES:  No Known Allergies  REVIEW OF SYSTEMS:  A comprehensive review of systems was  negative except for: Musculoskeletal: positive for myalgias and stiff joints   FAMILY HISTORY:   Family History  Problem Relation Age of Onset  . Anesthesia problems Neg Hx   . Stroke Father   . Hypertension Father   . Diabetes Mellitus II Father   . Irregular heart beat Father   . Heart disease Father   . Hyperlipidemia Father   . Diabetes Mellitus II Mother   . Hyperlipidemia Mother   . Thyroid disease Mother   . Hypertension Maternal Aunt   . Thyroid disease Maternal Aunt   . Diabetes Mellitus II Paternal Grandmother     SOCIAL HISTORY:   Social History  Substance Use Topics  . Smoking status: Never Smoker   . Smokeless tobacco: Never Used  . Alcohol Use: Yes     Comment: occasionally - none while pregnant     EXAMINATION:  Vital signs in last 24 hours:    BP 115/82 mmHg  Pulse 71  Temp(Src) 97.6 F (36.4 C) (Oral)  Resp 18  Ht 5\' 2"  (1.575 m)  Wt 111.585 kg (246 lb)  BMI 44.98 kg/m2  SpO2 100%  LMP 01/20/2015  General Appearance:    Alert, cooperative, no distress, appears stated age  Head:    Normocephalic, without obvious abnormality, atraumatic  Eyes:    PERRL, conjunctiva/corneas clear, EOM's intact, fundi    benign, both eyes  Ears:    Normal TM's and external ear canals, both ears  Nose:   Nares normal, septum midline, mucosa normal, no  drainage    or sinus tenderness  Throat:   Lips, mucosa, and tongue normal; teeth and gums normal  Neck:   Supple, symmetrical, trachea midline, no adenopathy;    thyroid:  no enlargement/tenderness/nodules; no carotid   bruit or JVD  Back:     Symmetric, no curvature, ROM normal, no CVA tenderness  Lungs:     Clear to auscultation bilaterally, respirations unlabored  Chest Wall:    No tenderness or deformity   Heart:    Regular rate and rhythm, S1 and S2 normal, no murmur, rub   or gallop  Breast Exam:    No tenderness, masses, or nipple abnormality  Abdomen:     Soft, non-tender, bowel sounds active all four  quadrants,    no masses, no organomegaly  Genitalia:    Normal female without lesion, discharge or tenderness  Rectal:    Normal tone, no masses or tenderness;   guaiac negative stool  Extremities:   Extremities normal, atraumatic, no cyanosis or edema  Pulses:   2+ and symmetric all extremities  Skin:   Skin color, texture, turgor normal, no rashes or lesions  Lymph nodes:   Cervical, supraclavicular, and axillary nodes normal  Neurologic:   CNII-XII intact, normal strength, sensation and reflexes    throughout     Imaging Review Plain radiographs demonstrate a foreign body (glass) in the plantar aspect of the left foot  Assessment/Plan: Foreign body left foot  The patient history, physical examination and imaging studies are consistent with foreign body left foor. The patient has failed conservative treatment.  The clearance notes were reviewed.  After discussion with the patient it was felt that surgical removal was indicated. The procedure,  risks, and benefits  were presented and reviewed. T The patient acknowledged the explanation, agreed to proceed with the plan.  Guy Sandifer 09/21/2015, 8:17 PM

## 2015-09-25 ENCOUNTER — Ambulatory Visit: Payer: Self-pay

## 2015-09-25 NOTE — Lactation Note (Signed)
This note was copied from a baby's chart. Lactation Consultation Note  Follow up visit made in NICU.  Mom states she is pumping every 2-3 hours and supply is abundant.  She denies questions or concerns.  Encouraged to call for concerns prn.  Patient Name: Andrea Irving BurtonOlivia Wharton ZOXWR'UToday's Date: 09/25/2015     Maternal Data    Feeding Feeding Type: Breast Milk Nipple Type: Slow - flow Length of feed: 60 min  LATCH Score/Interventions                      Lactation Tools Discussed/Used     Consult Status      Huston FoleyMOULDEN, Alexea Blase S 09/25/2015, 2:45 PM

## 2015-10-04 ENCOUNTER — Ambulatory Visit: Payer: Self-pay

## 2015-10-04 NOTE — Lactation Note (Signed)
This note was copied from a baby's chart. Lactation Consultation Note  Patient Name: Andrea [name] ZOXWR'UToday's Date: 10/04/2015 Reason for consult: Follow-up assessment;NICU baby NICU baby 412 weeks old. Parents roomed-in with baby in the NICU last night, and mom reports that baby and she did very well. Mom states that she has decided to pump and bottle-feed EBM exclusively. Enc mom to continue to offer STS and let baby nuzzle at breast, and discussed benefits to EBM supply. Enc mom to pump after baby fed so that she doesn't have to keep a separate pumping schedule. Mom stated that she is keeping up with the baby's demand for EBM now, but understands supply and demand. Enc mom to pump a couple of minutes past her last let-down. Mom aware of OP/BFSG and LC phone line assistance after D/C.   Maternal Data    Feeding    LATCH Score/Interventions                      Lactation Tools Discussed/Used     Consult Status Consult Status: PRN    [name], Akasia Ahmad 10/04/2015, 10:05 AM

## 2015-10-24 ENCOUNTER — Encounter (HOSPITAL_COMMUNITY): Payer: Self-pay | Admitting: Emergency Medicine

## 2015-10-24 ENCOUNTER — Emergency Department (HOSPITAL_COMMUNITY)
Admission: EM | Admit: 2015-10-24 | Discharge: 2015-10-24 | Disposition: A | Discharge status: Home / Self Care | Payer: Medicaid Other | Attending: Emergency Medicine | Admitting: Emergency Medicine

## 2015-10-24 DIAGNOSIS — Z794 Long term (current) use of insulin: Secondary | ICD-10-CM | POA: Diagnosis not present

## 2015-10-24 DIAGNOSIS — Y999 Unspecified external cause status: Secondary | ICD-10-CM | POA: Insufficient documentation

## 2015-10-24 DIAGNOSIS — R51 Headache: Secondary | ICD-10-CM | POA: Insufficient documentation

## 2015-10-24 DIAGNOSIS — M545 Low back pain: Secondary | ICD-10-CM | POA: Insufficient documentation

## 2015-10-24 DIAGNOSIS — Y92410 Unspecified street and highway as the place of occurrence of the external cause: Secondary | ICD-10-CM | POA: Insufficient documentation

## 2015-10-24 DIAGNOSIS — E119 Type 2 diabetes mellitus without complications: Secondary | ICD-10-CM | POA: Insufficient documentation

## 2015-10-24 DIAGNOSIS — Y9389 Activity, other specified: Secondary | ICD-10-CM | POA: Diagnosis not present

## 2015-10-24 MED ORDER — ACETAMINOPHEN 325 MG PO TABS
650.0000 mg | ORAL_TABLET | Freq: Once | ORAL | Status: AC
  Administered 2015-10-24: 650 mg via ORAL
  Filled 2015-10-24: qty 2
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
## 2015-10-24 NOTE — ED Notes (Signed)
Patient reports that she is unable to wait on discharge instructions.
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
## 2015-10-25 ENCOUNTER — Encounter (HOSPITAL_COMMUNITY): Payer: Self-pay

## 2015-10-25 ENCOUNTER — Emergency Department (HOSPITAL_COMMUNITY)
Admission: EM | Admit: 2015-10-25 | Discharge: 2015-10-25 | Disposition: A | Discharge status: Home / Self Care | Payer: Medicaid Other | Attending: Emergency Medicine | Admitting: Emergency Medicine

## 2015-10-25 DIAGNOSIS — Z794 Long term (current) use of insulin: Secondary | ICD-10-CM | POA: Insufficient documentation

## 2015-10-25 DIAGNOSIS — E119 Type 2 diabetes mellitus without complications: Secondary | ICD-10-CM | POA: Insufficient documentation

## 2015-10-25 DIAGNOSIS — Z79899 Other long term (current) drug therapy: Secondary | ICD-10-CM | POA: Diagnosis not present

## 2015-10-25 DIAGNOSIS — Y92410 Unspecified street and highway as the place of occurrence of the external cause: Secondary | ICD-10-CM | POA: Insufficient documentation

## 2015-10-25 DIAGNOSIS — M545 Low back pain, unspecified: Secondary | ICD-10-CM

## 2015-10-25 DIAGNOSIS — Y9389 Activity, other specified: Secondary | ICD-10-CM | POA: Diagnosis not present

## 2015-10-25 DIAGNOSIS — Y999 Unspecified external cause status: Secondary | ICD-10-CM | POA: Insufficient documentation

## 2015-10-25 MED ORDER — LIDOCAINE 5 % EX PTCH: 1.0000 | MEDICATED_PATCH | CUTANEOUS | Status: DC
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
## 2015-10-31 ENCOUNTER — Ambulatory Visit: Payer: Medicaid Other | Admitting: Obstetrics & Gynecology

## 2015-11-06 ENCOUNTER — Ambulatory Visit: Payer: Medicaid Other | Admitting: Sports Medicine

## 2015-11-13 ENCOUNTER — Encounter: Payer: Self-pay | Admitting: Sports Medicine

## 2015-11-13 ENCOUNTER — Ambulatory Visit (INDEPENDENT_AMBULATORY_CARE_PROVIDER_SITE_OTHER): Payer: Medicaid Other | Admitting: Sports Medicine

## 2015-11-13 VITALS — BP 136/86 | HR 85 | Ht 62.0 in | Wt 244.0 lb

## 2015-11-13 DIAGNOSIS — M545 Low back pain, unspecified: Secondary | ICD-10-CM

## 2015-11-13 MED ORDER — METHYLPREDNISOLONE ACETATE 80 MG/ML IJ SUSP
80.0000 mg | Freq: Once | INTRAMUSCULAR | Status: AC
  Administered 2015-11-13: 80 mg via INTRAMUSCULAR
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
## 2015-11-18 ENCOUNTER — Ambulatory Visit
Admission: RE | Admit: 2015-11-18 | Discharge: 2015-11-18 | Disposition: A | Discharge status: Home / Self Care | Payer: Medicaid Other | Source: Ambulatory Visit | Attending: Sports Medicine | Admitting: Sports Medicine

## 2015-11-18 DIAGNOSIS — M545 Low back pain, unspecified: Secondary | ICD-10-CM

## 2015-11-19 ENCOUNTER — Telehealth: Payer: Self-pay | Admitting: Sports Medicine

## 2015-11-19 NOTE — Telephone Encounter (Signed)
X-rays of the lumbar spine are reviewed. They're unremarkable. Patient will be notified and reassurance given.

## 2016-02-25 ENCOUNTER — Ambulatory Visit: Payer: Medicaid Other | Attending: Family Medicine | Admitting: Family Medicine

## 2016-02-25 ENCOUNTER — Encounter: Payer: Self-pay | Admitting: Family Medicine

## 2016-02-25 VITALS — BP 125/85 | HR 92 | Temp 97.5°F | Ht 62.0 in | Wt 261.0 lb

## 2016-02-25 DIAGNOSIS — Z794 Long term (current) use of insulin: Secondary | ICD-10-CM | POA: Insufficient documentation

## 2016-02-25 DIAGNOSIS — Z6841 Body Mass Index (BMI) 40.0 and over, adult: Secondary | ICD-10-CM | POA: Insufficient documentation

## 2016-02-25 DIAGNOSIS — E1165 Type 2 diabetes mellitus with hyperglycemia: Secondary | ICD-10-CM | POA: Diagnosis not present

## 2016-02-25 DIAGNOSIS — IMO0001 Reserved for inherently not codable concepts without codable children: Secondary | ICD-10-CM

## 2016-02-25 LAB — POCT GLYCOSYLATED HEMOGLOBIN (HGB A1C): HEMOGLOBIN A1C: 8.5

## 2016-02-25 LAB — GLUCOSE, POCT (MANUAL RESULT ENTRY): POC Glucose: 360 mg/dl — AB (ref 70–99)

## 2016-02-25 MED ORDER — INSULIN GLARGINE 100 UNIT/ML SOLOSTAR PEN: 60.0000 [IU] | PEN_INJECTOR | Freq: Every day | SUBCUTANEOUS | 3 refills | Status: DC

## 2016-02-25 NOTE — Progress Notes (Signed)
5'2 261 

## 2016-02-25 NOTE — Progress Notes (Signed)
Subjective:  Patient ID: Andrea [name], female    DOB: 17-Nov-1990  Age: 25 y.o. MRN: [id number]  CC: Diabetes and Establish Care   HPI Andrea [name] is a 25 year old morbidly obese female with a history of gestational diabetes mellitus (A1c 8.5 today) who comes into the clinic to establish care. She has been compliant with Lantus 60 units but has not been checking her blood sugars. Denies hypoglycemia, numbness in extremities or blurry vision. Has no complaints today.  Past Medical History:  Diagnosis Date  . Anemia    as a teenager  . Chicken pox   . Diabetes mellitus    type 2 on insulin    Past Surgical History:  Procedure Laterality Date  . CESAREAN SECTION N/A 09/16/2015   Procedure: CESAREAN SECTION;  Surgeon: Guss Bunde, MD;  Location: [place];  Service: Obstetrics;  Laterality: N/A;  . FOREIGN BODY REMOVAL Left 08/28/2015   Procedure: REMOVAL FOREIGN BODY foot;  Surgeon: Vickey Huger, MD;  Location: [place];  Service: Orthopedics;  Laterality: Left;  regional block  . NO PAST SURGERIES      No Known Allergies   Outpatient Medications Prior to Visit  Medication Sig Dispense Refill  . Blood Glucose Monitoring Suppl ([place]) W/DEVICE KIT Use as advised 1 each 0  . glucose blood test strip Use as instructed 100 each 12  . Prenatal Multivit-Min-Fe-FA (PRENATAL VITAMINS) 0.8 MG tablet Take 1 tablet by mouth daily. 30 tablet 12  . insulin glargine (LANTUS) 100 UNIT/ML injection Inject 0.35 mLs (35 Units total) into the skin at bedtime. 10 mL 11  . Lancets (ONETOUCH ULTRASOFT) lancets Use as instructed (Patient not taking: Reported on 02/25/2016) 100 each 12  . ONE TOUCH LANCETS MISC Check sugars 3x a day (Patient not taking: Reported on 02/25/2016) 200 each 11   No facility-administered medications prior to visit.     ROS Review of Systems  Constitutional: Negative for activity change and appetite change.  HENT: Negative for sinus  pressure and sore throat.   Respiratory: Negative for chest tightness, shortness of breath and wheezing.   Cardiovascular: Negative for chest pain and palpitations.  Gastrointestinal: Negative for abdominal distention, abdominal pain and constipation.  Genitourinary: Negative.   Musculoskeletal: Negative.   Psychiatric/Behavioral: Negative for behavioral problems and dysphoric mood.    Objective:  BP 125/85 (BP Location: Right Arm, Patient Position: Sitting, Cuff Size: Large)   Pulse 92   Temp 97.5 F (36.4 C) (Oral)   Ht '5\' 2"'  (1.575 m)   Wt 261 lb (118.4 kg)   SpO2 99%   Breastfeeding? Yes   BMI 47.74 kg/m   BP/Weight 02/25/2016 11/13/2015 1/69/6789  Systolic BP 381 017 510  Diastolic BP 85 86 84  Wt. (Lbs) 261 244 -  BMI 47.74 44.63 -      Physical Exam  Constitutional: She is oriented to person, place, and time. She appears well-developed and well-nourished.  Morbidly obese  Cardiovascular: Normal rate, normal heart sounds and intact distal pulses.   No murmur heard. Pulmonary/Chest: Effort normal and breath sounds normal. She has no wheezes. She has no rales. She exhibits no tenderness.  Abdominal: Soft. Bowel sounds are normal. She exhibits no distension and no mass. There is no tenderness.  Musculoskeletal: Normal range of motion.  Neurological: She is alert and oriented to person, place, and time.    Lab Results  Component Value Date   HGBA1C 8.5 02/25/2016  Assessment & Plan:   1. Uncontrolled type 2 diabetes mellitus without complication, with long-term current use of insulin ([place]) Uncontrolled with hemoglobin A1c of 8.5 Patient to keep a blood sugar log which will be reviewed at her next office visit We'll consider adding statin to regimen next visit Fasting labs at next visit. - Glucose (CBG) - HgB A1c - Insulin Glargine (LANTUS SOLOSTAR) 100 UNIT/ML Solostar Pen; Inject 60 Units into the skin daily at 10 pm.  Dispense: 5 pen; Refill: 3  2. Morbid  obesity ([place]) Reduce portion sizes Increase physical activity   Meds ordered this encounter  Medications  . Insulin Glargine (LANTUS SOLOSTAR) 100 UNIT/ML Solostar Pen    Sig: Inject 60 Units into the skin daily at 10 pm.    Dispense:  5 pen    Refill:  3    Follow-up: Return in about 2 weeks (around 03/10/2016) for follow up on  Diabetes mellitus.   Arnoldo Morale MD

## 2016-05-25 ENCOUNTER — Inpatient Hospital Stay (HOSPITAL_COMMUNITY)
Admission: AD | Admit: 2016-05-25 | Discharge: 2016-05-25 | Disposition: A | Discharge status: Home / Self Care | Payer: Medicaid Other | Source: Ambulatory Visit | Attending: Obstetrics and Gynecology | Admitting: Obstetrics and Gynecology

## 2016-05-25 ENCOUNTER — Encounter (HOSPITAL_COMMUNITY): Payer: Self-pay | Admitting: *Deleted

## 2016-05-25 DIAGNOSIS — Z794 Long term (current) use of insulin: Secondary | ICD-10-CM | POA: Insufficient documentation

## 2016-05-25 DIAGNOSIS — Z3202 Encounter for pregnancy test, result negative: Secondary | ICD-10-CM | POA: Diagnosis not present

## 2016-05-25 DIAGNOSIS — N939 Abnormal uterine and vaginal bleeding, unspecified: Secondary | ICD-10-CM | POA: Diagnosis not present

## 2016-05-25 DIAGNOSIS — N760 Acute vaginitis: Secondary | ICD-10-CM | POA: Diagnosis not present

## 2016-05-25 DIAGNOSIS — B9689 Other specified bacterial agents as the cause of diseases classified elsewhere: Secondary | ICD-10-CM | POA: Insufficient documentation

## 2016-05-25 DIAGNOSIS — E119 Type 2 diabetes mellitus without complications: Secondary | ICD-10-CM | POA: Insufficient documentation

## 2016-05-25 LAB — WET PREP, GENITAL
Sperm: NONE SEEN
TRICH WET PREP: NONE SEEN
Yeast Wet Prep HPF POC: NONE SEEN

## 2016-05-25 LAB — TYPE AND SCREEN
ABO/RH(D): B POS
ANTIBODY SCREEN: NEGATIVE

## 2016-05-25 LAB — URINALYSIS, ROUTINE W REFLEX MICROSCOPIC
BACTERIA UA: NONE SEEN
BILIRUBIN URINE: NEGATIVE
Glucose, UA: >=500 mg/dL — AB
Ketones, ur: NEGATIVE mg/dL
Leukocytes, UA: NEGATIVE
NITRITE: NEGATIVE
PH: 6 (ref 5.0–8.0)
Protein, ur: NEGATIVE mg/dL
SPECIFIC GRAVITY, URINE: 1.02 (ref 1.005–1.030)

## 2016-05-25 LAB — CBC
HEMATOCRIT: 35 % — AB (ref 36.0–46.0)
HEMOGLOBIN: 11.8 g/dL — AB (ref 12.0–15.0)
MCH: 23.2 pg — ABNORMAL LOW (ref 26.0–34.0)
MCHC: 33.7 g/dL (ref 30.0–36.0)
MCV: 68.8 fL — ABNORMAL LOW (ref 78.0–100.0)
Platelets: 428 10*3/uL — ABNORMAL HIGH (ref 150–400)
RBC: 5.09 MIL/uL (ref 3.87–5.11)
RDW: 14.8 % (ref 11.5–15.5)
WBC: 5.9 10*3/uL (ref 4.0–10.5)

## 2016-05-25 LAB — POCT PREGNANCY, URINE: PREG TEST UR: NEGATIVE

## 2016-05-25 LAB — PREGNANCY, URINE: PREG TEST UR: NEGATIVE

## 2016-05-25 MED ORDER — METRONIDAZOLE 500 MG PO TABS: 500.0000 mg | ORAL_TABLET | Freq: Two times a day (BID) | ORAL | 0 refills | Status: DC

## 2016-05-25 MED ORDER — NORGESTIMATE-ETH ESTRADIOL 0.25-35 MG-MCG PO TABS: 1.0000 | ORAL_TABLET | Freq: Every day | ORAL | 11 refills | Status: DC

## 2016-05-25 NOTE — Discharge Instructions (Signed)
Abnormal Uterine Bleeding Abnormal uterine bleeding can affect women at various stages in life, including teenagers, women in their reproductive years, pregnant women, and women who have reached menopause. Several kinds of uterine bleeding are considered abnormal, including:  Bleeding or spotting between periods.  Bleeding after sexual intercourse.  Bleeding that is heavier or more than normal.  Periods that last longer than usual.  Bleeding after menopause. Many cases of abnormal uterine bleeding are minor and simple to treat, while others are more serious. Any type of abnormal bleeding should be evaluated by your health care provider. Treatment will depend on the cause of the bleeding. Follow these instructions at home: Monitor your condition for any changes. The following actions may help to alleviate any discomfort you are experiencing:  Avoid the use of tampons and douches as directed by your health care provider.  Change your pads frequently. You should get regular pelvic exams and Pap tests. Keep all follow-up appointments for diagnostic tests as directed by your health care provider. Contact a health care provider if:  Your bleeding lasts more than 1 week.  You feel dizzy at times. Get help right away if:  You pass out.  You are changing pads every 15 to 30 minutes.  You have abdominal pain.  You have a fever.  You become sweaty or weak.  You are passing large blood clots from the vagina.  You start to feel nauseous and vomit. This information is not intended to replace advice given to you by your health care provider. Make sure you discuss any questions you have with your health care provider. Document Released: 03/30/2005 Document Revised: 09/11/2015 Document Reviewed: 10/27/2012 Elsevier Interactive Patient Education  2017 [employer]  

## 2016-05-25 NOTE — MAU Provider Note (Signed)
Chief Complaint: Vaginal Bleeding   First Provider Initiated Contact with Patient 05/25/16 2238     SUBJECTIVE HPI: Andrea [name] is a 26 y.o. G35P0101 female who presents to Maternity Admissions reporting Vaginal bleeding since late January. Had recently stopped breast feeding her 15-month -old baby. Had not been having menstrual periods while breastfeeding until ~3 months ago. Had a few normal, but light periods. Started bleeding in Late January, but bleeding has lasted much longer than her nml 5-days periods and bleeding has occasionally been heavy. Has not been evaluated for this problem and is not on birth control.    Associated signs and symptoms: Neg for abd pain, dyspareunia, post-coital bleeding, passage of clots, dizziness.   Past Medical History:  Diagnosis Date  . Anemia    as a teenager  . Chicken pox   . Diabetes mellitus    type 2 on insulin   OB History  Gravida Para Term Preterm AB Living  2 1 0 1 0 1  SAB TAB Ectopic Multiple Live Births  0 0 0 0 1    # Outcome Date GA Lbr Len/2nd Weight Sex Delivery Anes PTL Lv  2 Preterm 09/16/15 [redacted]w[redacted]d  4 lb 4.8 oz (1.95 kg) M CS-LTranv EPI  LIV  1 Gravida              Past Surgical History:  Procedure Laterality Date  . CESAREAN SECTION N/A 09/16/2015   Procedure: CESAREAN SECTION;  Surgeon: Lesly Dukes, MD;  Location: [facility] BIRTHING SUITES;  Service: Obstetrics;  Laterality: N/A;  . FOREIGN BODY REMOVAL Left 08/28/2015   Procedure: REMOVAL FOREIGN BODY foot;  Surgeon: Dannielle Huh, MD;  Location: [facility] OR;  Service: Orthopedics;  Laterality: Left;  regional block  . NO PAST SURGERIES     Social History   Social History  . Marital status: Legally Separated    Spouse name: N/A  . Number of children: N/A  . Years of education: N/A   Occupational History  . Not on file.   Social History Main Topics  . Smoking status: Never Smoker  . Smokeless tobacco: Never Used  . Alcohol use Yes     Comment: occasionally - none while  pregnant  . Drug use: No  . Sexual activity: Yes    Birth control/ protection: None   Other Topics Concern  . Not on file   Social History Narrative  . No narrative on file   Family History  Problem Relation Age of Onset  . Stroke Father   . Hypertension Father   . Diabetes Mellitus II Father   . Irregular heart beat Father   . Heart disease Father   . Hyperlipidemia Father   . Diabetes Mellitus II Mother   . Hyperlipidemia Mother   . Thyroid disease Mother   . Hypertension Maternal Aunt   . Thyroid disease Maternal Aunt   . Diabetes Mellitus II Paternal Grandmother   . Anesthesia problems Neg Hx    No current facility-administered medications on file prior to encounter.    Current Outpatient Prescriptions on File Prior to Encounter  Medication Sig Dispense Refill  . Insulin Glargine (LANTUS SOLOSTAR) 100 UNIT/ML Solostar Pen Inject 60 Units into the skin daily at 10 pm. (Patient taking differently: Inject 70 Units into the skin at bedtime. ) 5 pen 3   No Known Allergies  I have reviewed patient's Past Medical Hx, Surgical Hx, Family Hx, Social Hx, medications and allergies.   Review of Systems  Constitutional: Negative for chills and fever.       Pos for ~50 lb weight gain.   Gastrointestinal: Negative for abdominal pain.  Genitourinary: Positive for vaginal bleeding. Negative for vaginal discharge.  Neurological: Negative for dizziness and weakness.  Hematological: Does not bruise/bleed easily.    OBJECTIVE Patient Vitals for the past 24 hrs:  BP Temp Temp src Pulse Resp  05/25/16 2129 123/61 97.9 F (36.6 C) Oral 91 16   Constitutional: Well-developed, well-nourished female in no acute distress.  Skin: no pallor Cardiovascular: normal rate Respiratory: normal rate and effort.  GI: Abd soft, non-tender. MS: Extremities nontender, no edema, normal ROM Neurologic: Alert and oriented x 4.  GU:  SPECULUM EXAM: NEFG, physiologic discharge, scant dark red blood  noted, cervix clean  BIMANUAL: cervix closed; uterus normal size, no adnexal tenderness or masses. No CMT.  LAB RESULTS Results for orders placed or performed during the hospital encounter of 05/25/16 (from the past 24 hour(s))  Urinalysis, Routine w reflex microscopic     Status: Abnormal   Collection Time: 05/25/16  9:15 PM  Result Value Ref Range   Color, Urine STRAW (A) YELLOW   APPearance CLEAR CLEAR   Specific Gravity, Urine 1.020 1.005 - 1.030   pH 6.0 5.0 - 8.0   Glucose, UA >=500 (A) NEGATIVE mg/dL   Hgb urine dipstick LARGE (A) NEGATIVE   Bilirubin Urine NEGATIVE NEGATIVE   Ketones, ur NEGATIVE NEGATIVE mg/dL   Protein, ur NEGATIVE NEGATIVE mg/dL   Nitrite NEGATIVE NEGATIVE   Leukocytes, UA NEGATIVE NEGATIVE   RBC / HPF 6-30 0 - 5 RBC/hpf   WBC, UA 0-5 0 - 5 WBC/hpf   Bacteria, UA NONE SEEN NONE SEEN   Squamous Epithelial / LPF 0-5 (A) NONE SEEN  Pregnancy, urine     Status: None   Collection Time: 05/25/16  9:15 PM  Result Value Ref Range   Preg Test, Ur NEGATIVE NEGATIVE  Pregnancy, urine POC     Status: None   Collection Time: 05/25/16  9:25 PM  Result Value Ref Range   Preg Test, Ur NEGATIVE NEGATIVE  CBC     Status: Abnormal   Collection Time: 05/25/16 10:13 PM  Result Value Ref Range   WBC 5.9 4.0 - 10.5 K/uL   RBC 5.09 3.87 - 5.11 MIL/uL   Hemoglobin 11.8 (L) 12.0 - 15.0 g/dL   HCT 16.135.0 (L) 09.636.0 - 04.546.0 %   MCV 68.8 (L) 78.0 - 100.0 fL   MCH 23.2 (L) 26.0 - 34.0 pg   MCHC 33.7 30.0 - 36.0 g/dL   RDW 40.914.8 81.111.5 - 91.415.5 %   Platelets 428 (H) 150 - 400 K/uL  Type and screen     Status: None   Collection Time: 05/25/16 10:13 PM  Result Value Ref Range   ABO/RH(D) B POS    Antibody Screen NEG    Sample Expiration 05/28/2016   Wet prep, genital     Status: Abnormal   Collection Time: 05/25/16 10:38 PM  Result Value Ref Range   Yeast Wet Prep HPF POC NONE SEEN NONE SEEN   Trich, Wet Prep NONE SEEN NONE SEEN   Clue Cells Wet Prep HPF POC PRESENT (A) NONE  SEEN   WBC, Wet Prep HPF POC FEW (A) NONE SEEN   Sperm NONE SEEN     IMAGING No results found.  MAU COURSE Orders Placed This Encounter  Procedures  . Wet prep, genital  . Urinalysis, Routine w reflex microscopic  .  Pregnancy, urine  . CBC  . Pregnancy, urine POC  . Type and screen  . Discharge patient   Meds ordered this encounter  Medications  . norgestimate-ethinyl estradiol (ORTHO-CYCLEN,SPRINTEC,PREVIFEM) 0.25-35 MG-MCG tablet    Sig: Take 1 tablet by mouth daily.    Dispense:  1 Package    Refill:  11    Order Specific Question:   Supervising Provider    Answer:   ERVIN, MICHAEL L [[id number]]  . metroNIDAZOLE (FLAGYL) 500 MG tablet    Sig: Take 1 tablet (500 mg total) by mouth 2 (two) times daily.    Dispense:  14 tablet    Refill:  0    Order Specific Question:   Supervising Provider    Answer:   Alysia Penna, MICHAEL L [[id number]]    MDM - AUB likely due to discontinuation of breastfeeding vs anovulatory cycles due to weight gain. Hemodynamically stable. Minimal active bleeding. Discussed Tx options. Pt would like to try OCPs.   ASSESSMENT 1. Abnormal uterine bleeding   2. BV (bacterial vaginosis)     PLAN Discharge home in stable condition. Bleeding precautions Follow-up Information    [facility] Follow up in 2 month(s).   Specialty:  Obstetrics and Gynecology Why:  as needed for menstrual problems and routine gynecology care Contact information: [address] [place] [address] [phone]       [facility] [place] MATERNITY ADMISSIONS Follow up.   Why:  as needed in gynecology emergencies Contact information: [address] [id number] mc [place] [address] [phone]         Allergies as of 05/25/2016   No Known Allergies     Medication List    TAKE these medications   Insulin Glargine 100 UNIT/ML Solostar Pen Commonly known as:  LANTUS SOLOSTAR Inject 60 Units  into the skin daily at 10 pm. What changed:  how much to take  when to take this   metroNIDAZOLE 500 MG tablet Commonly known as:  FLAGYL Take 1 tablet (500 mg total) by mouth 2 (two) times daily.   norgestimate-ethinyl estradiol 0.25-35 MG-MCG tablet Commonly known as:  ORTHO-CYCLEN,SPRINTEC,PREVIFEM Take 1 tablet by mouth daily.        [address], [place] 05/25/2016  11:28 PM

## 2016-05-25 NOTE — MAU Note (Signed)
Pt reports normal periods this fall. Started with spotting in November/December. Spotted in Jan, then at the end of January she started bleeding and it hasn't stopped since. She also was nursing and stopped in January.  Pt denies pain.

## 2016-05-26 LAB — GC/CHLAMYDIA PROBE AMP (~~LOC~~) NOT AT ARMC
CHLAMYDIA, DNA PROBE: NEGATIVE
NEISSERIA GONORRHEA: NEGATIVE

## 2016-06-01 ENCOUNTER — Ambulatory Visit (INDEPENDENT_AMBULATORY_CARE_PROVIDER_SITE_OTHER): Payer: Medicaid Other | Admitting: Internal Medicine

## 2016-06-01 ENCOUNTER — Encounter: Payer: Self-pay | Admitting: Internal Medicine

## 2016-06-01 VITALS — BP 137/82 | HR 82 | Temp 97.8°F | Ht 62.0 in | Wt 256.8 lb

## 2016-06-01 DIAGNOSIS — IMO0001 Reserved for inherently not codable concepts without codable children: Secondary | ICD-10-CM

## 2016-06-01 DIAGNOSIS — Z833 Family history of diabetes mellitus: Secondary | ICD-10-CM | POA: Diagnosis not present

## 2016-06-01 DIAGNOSIS — E1165 Type 2 diabetes mellitus with hyperglycemia: Secondary | ICD-10-CM | POA: Diagnosis present

## 2016-06-01 DIAGNOSIS — Z823 Family history of stroke: Secondary | ICD-10-CM | POA: Diagnosis not present

## 2016-06-01 DIAGNOSIS — Z8249 Family history of ischemic heart disease and other diseases of the circulatory system: Secondary | ICD-10-CM | POA: Diagnosis not present

## 2016-06-01 DIAGNOSIS — Z8349 Family history of other endocrine, nutritional and metabolic diseases: Secondary | ICD-10-CM | POA: Diagnosis not present

## 2016-06-01 DIAGNOSIS — Z794 Long term (current) use of insulin: Secondary | ICD-10-CM | POA: Diagnosis not present

## 2016-06-01 DIAGNOSIS — Z0000 Encounter for general adult medical examination without abnormal findings: Secondary | ICD-10-CM | POA: Insufficient documentation

## 2016-06-01 DIAGNOSIS — Z6841 Body Mass Index (BMI) 40.0 and over, adult: Secondary | ICD-10-CM

## 2016-06-01 LAB — POCT GLYCOSYLATED HEMOGLOBIN (HGB A1C)

## 2016-06-01 LAB — GLUCOSE, CAPILLARY: Glucose-Capillary: 281 mg/dL — ABNORMAL HIGH (ref 65–99)

## 2016-06-01 MED ORDER — INSULIN ASPART 100 UNIT/ML FLEXPEN: 8.0000 [IU] | PEN_INJECTOR | Freq: Three times a day (TID) | SUBCUTANEOUS | 11 refills | Status: DC

## 2016-06-01 MED ORDER — LOVASTATIN 20 MG PO TABS: 40.0000 mg | ORAL_TABLET | Freq: Every day | ORAL | 11 refills | Status: DC

## 2016-06-01 MED ORDER — INSULIN GLARGINE 100 UNIT/ML SOLOSTAR PEN: 30.0000 [IU] | PEN_INJECTOR | Freq: Two times a day (BID) | SUBCUTANEOUS | 3 refills | Status: DC

## 2016-06-01 MED ORDER — PEN NEEDLES 31G X 5 MM MISC: 1.0000 | Freq: Three times a day (TID) | 11 refills | Status: DC

## 2016-06-01 MED ORDER — GLUCOSE BLOOD VI STRP: ORAL_STRIP | 12 refills | Status: DC

## 2016-06-01 NOTE — Progress Notes (Signed)
CC: Establish care for diabetes  HPI: Andrea [name] is a 26 y.o. female with PMHx of type 2 diabetes, obesity who presents to the clinic for establish care for diabetes.  Patient was diagnosed with diabetes in 2012. She states that she was initially on oral medications, but quickly transitioned to insulin therapy. A1c one year ago as well controlled in the 5-7 range. However, after having her baby in June 2017, patient's A1c was 8.5 in November. Today, her A1c is greater than 14. She has been taking Lantus 70 units at night. She reports hyperglycemia throughout the day. She admits to weight gain partly from pregnancy but also from dietary indiscretion. She reports eating a large amount of fried fatty foods. Her NovoLog was discontinued during pregnancy due to hypoglycemia.  Patient reports compliance with her birth control started in January 2018 by her primary OB/GYN. This was also for menorrhagia.  Patient last had a Pap smear in January 2017 which was normal. Repeat Pap smear in 2020.   Past Medical History:  Diagnosis Date  . Anemia    as a teenager  . Chicken pox   . Diabetes mellitus    type 2 on insulin   Social History   Social History  . Marital status: Legally Separated    Spouse name: N/A  . Number of children: N/A  . Years of education: N/A   Social History Main Topics  . Smoking status: Never Smoker  . Smokeless tobacco: Never Used  . Alcohol use Yes     Comment: occasionally - none while pregnant  . Drug use: No  . Sexual activity: Yes    Birth control/ protection: None   Other Topics Concern  . None   Social History Narrative  . None   Family History  Problem Relation Age of Onset  . Stroke Father   . Hypertension Father   . Diabetes Mellitus II Father   . Irregular heart beat Father   . Heart disease Father   . Hyperlipidemia Father   . Diabetes Mellitus II Mother   . Hyperlipidemia Mother   . Thyroid disease Mother   . Hypertension  Maternal Aunt   . Thyroid disease Maternal Aunt   . Diabetes Mellitus II Paternal Grandmother   . Anesthesia problems Neg Hx    Past Surgical History:  Procedure Laterality Date  . CESAREAN SECTION N/A 09/16/2015   Procedure: CESAREAN SECTION;  Surgeon: Lesly DukesKelly H Leggett, MD;  [facility];  Service: Obstetrics;  Laterality: N/A;  . FOREIGN BODY REMOVAL Left 08/28/2015   Procedure: REMOVAL FOREIGN BODY foot;  Surgeon: Dannielle HuhSteve Lucey, MD;  Location: [facility] OR;  Service: Orthopedics;  Laterality: Left;  regional block  . NO PAST SURGERIES      Review of Systems: Please see pertinent ROS reviewed in HPI and problem based charting.   Physical Exam: Vitals:   06/01/16 1334  BP: 137/82  Pulse: 82  Temp: 97.8 F (36.6 C)  TempSrc: Oral  SpO2: 100%  Weight: 256 lb 12.8 oz (116.5 kg)  Height: 5\' 2"  (1.575 m)   General: Vital signs reviewed.  Patient is well-developed and well-nourished, Obese, in no acute distress and cooperative with exam.  Head: Normocephalic and atraumatic. Eyes: PERRLA, conjunctivae normal, no scleral icterus.  Neck: Supple, trachea midline, no carotid bruit present.  Cardiovascular: RRR, S1 normal, S2 normal, no murmurs, gallops, or rubs. Pulmonary/Chest: Clear to auscultation bilaterally, no wheezes, rales, or rhonchi. Abdominal: Soft, non-tender, non-distended,  BS +, obese.  Extremities: No lower extremity edema bilaterally Neurological: A&O, no focal motor deficit, sensory intact to light touch bilaterally.  Skin: Warm, dry and intact. No rashes or erythema. Psychiatric: Normal mood and affect. speech and behavior is normal. Cognition and memory are normal.   Assessment & Plan:  See encounters tab for problem based medical decision making. Patient discussed with Dr. Cleda Daub

## 2016-06-01 NOTE — Assessment & Plan Note (Signed)
Patient was diagnosed with diabetes in 2012. She states that she was initially on oral medications, but quickly transitioned to insulin therapy. A1c one year ago as well controlled in the 5-7 range. However, after having her baby in June 2017, patient's A1c was 8.5 in November. Today, her A1c is greater than 14. She has been taking Lantus 70 units at night. She reports hyperglycemia throughout the day. She admits to weight gain partly from pregnancy but also from dietary indiscretion. She reports eating a large amount of fried fatty foods. Her NovoLog was discontinued during pregnancy due to hypoglycemia.  Lab Results  Component Value Date   HGBA1C >14.0 06/01/2016   HGBA1C 8.5 02/25/2016   HGBA1C 5.8 (H) 07/22/2015     Assessment: Diabetes control:  uncontrolled Progress toward A1C goal:   above goal, deteriorated Comments: Taking Lantus 70 units at night  Plan: Medications:  Decrease Lantus to 30 units twice a day and increase NovoLog to 8 units 3 times a day with meals Instruction/counseling given: reminded to bring medications to each visit and discussed the need for weight loss Other plans: Added lovastatin 40 mg once a day. Patient should not take this if she gets pregnant given regnancy category ax. She is currently on her control. Patient should follow up in 4 weeks.

## 2016-06-01 NOTE — Assessment & Plan Note (Signed)
Patient reports compliance with her birth control started in January 2018 by her primary OB/GYN. This was also for menorrhagia.  Patient last had a Pap smear in January 2017 which was normal. Repeat Pap smear in 2020.

## 2016-06-01 NOTE — Progress Notes (Signed)
Internal Medicine Clinic Attending  Case discussed with Dr. Burns soon after the resident saw the patient.  We reviewed the resident's history and exam and pertinent patient test results.  I agree with the assessment, diagnosis, and plan of care documented in the resident's note. 

## 2016-06-01 NOTE — Assessment & Plan Note (Signed)
Patient admits to weight gain partly from pregnancy but also from dietary indiscretion. She reports eating a large amount of fried fatty foods.   Plan: -Counseled on diet and exercise

## 2016-06-01 NOTE — Patient Instructions (Signed)
Use Lantus 30 units twice a day. Use NovoLog 8 units before each meal 3 times a day. Continue to check your blood sugars before each meal and at bedtime. Please follow up in 1 month for recheck of your blood sugars. We have also prescribed lovastatin 40 mg once a day for cholesterol and diabetes.

## 2016-06-21 ENCOUNTER — Encounter (HOSPITAL_COMMUNITY): Payer: Self-pay | Admitting: Emergency Medicine

## 2016-06-21 ENCOUNTER — Emergency Department (HOSPITAL_COMMUNITY)
Admission: EM | Admit: 2016-06-21 | Discharge: 2016-06-21 | Disposition: A | Discharge status: Home / Self Care | Payer: Medicaid Other | Attending: Dermatology | Admitting: Dermatology

## 2016-06-21 DIAGNOSIS — R7309 Other abnormal glucose: Secondary | ICD-10-CM | POA: Diagnosis not present

## 2016-06-21 DIAGNOSIS — Z5321 Procedure and treatment not carried out due to patient leaving prior to being seen by health care provider: Secondary | ICD-10-CM | POA: Insufficient documentation

## 2016-06-21 LAB — CBG MONITORING, ED: GLUCOSE-CAPILLARY: 341 mg/dL — AB (ref 65–99)

## 2016-06-21 NOTE — ED Notes (Signed)
No answer when called for reevaluation 

## 2016-06-21 NOTE — ED Notes (Signed)
Pt states her sugar was 546 mg/dL earlier on a home glucometer.

## 2016-06-21 NOTE — ED Triage Notes (Signed)
Pt reported that her blood sugar was 546 tonight and she is currently taking Lantus and 80 units was administered at 2211 on 06/20/16. Pt reports that she has not gotten her Novolog prescription from the pharmacy due to issues with the dr office filling prescription. Pt stated that the prescription was supposed to be for sliding scale. Pt denies any symptoms at this time.

## 2016-06-21 NOTE — ED Notes (Addendum)
Pt not in lobby when called for room. Was advised she had left.

## 2016-06-24 ENCOUNTER — Encounter (HOSPITAL_COMMUNITY): Payer: Self-pay

## 2016-06-24 ENCOUNTER — Inpatient Hospital Stay (HOSPITAL_COMMUNITY)
Admission: AD | Admit: 2016-06-24 | Discharge: 2016-06-24 | Disposition: A | Discharge status: Home / Self Care | Payer: Medicaid Other | Source: Ambulatory Visit | Attending: Obstetrics & Gynecology | Admitting: Obstetrics & Gynecology

## 2016-06-24 DIAGNOSIS — N946 Dysmenorrhea, unspecified: Secondary | ICD-10-CM | POA: Diagnosis not present

## 2016-06-24 DIAGNOSIS — N939 Abnormal uterine and vaginal bleeding, unspecified: Secondary | ICD-10-CM

## 2016-06-24 DIAGNOSIS — N920 Excessive and frequent menstruation with regular cycle: Secondary | ICD-10-CM | POA: Diagnosis present

## 2016-06-24 DIAGNOSIS — Z794 Long term (current) use of insulin: Secondary | ICD-10-CM | POA: Insufficient documentation

## 2016-06-24 DIAGNOSIS — E119 Type 2 diabetes mellitus without complications: Secondary | ICD-10-CM | POA: Insufficient documentation

## 2016-06-24 DIAGNOSIS — Z3202 Encounter for pregnancy test, result negative: Secondary | ICD-10-CM | POA: Diagnosis not present

## 2016-06-24 LAB — URINALYSIS, ROUTINE W REFLEX MICROSCOPIC
BILIRUBIN URINE: NEGATIVE
Glucose, UA: >=500 mg/dL — AB
KETONES UR: NEGATIVE mg/dL
LEUKOCYTES UA: NEGATIVE
NITRITE: NEGATIVE
PH: 7 (ref 5.0–8.0)
PROTEIN: NEGATIVE mg/dL
Specific Gravity, Urine: 1.01 (ref 1.005–1.030)

## 2016-06-24 LAB — POCT PREGNANCY, URINE: PREG TEST UR: NEGATIVE

## 2016-06-24 LAB — URINALYSIS, MICROSCOPIC (REFLEX)

## 2016-06-24 LAB — GLUCOSE, CAPILLARY: GLUCOSE-CAPILLARY: 254 mg/dL — AB (ref 65–99)

## 2016-06-24 MED ORDER — KETOROLAC TROMETHAMINE 60 MG/2ML IM SOLN
60.0000 mg | Freq: Once | INTRAMUSCULAR | Status: AC
  Administered 2016-06-24: 60 mg via INTRAMUSCULAR
  Filled 2016-06-24: qty 2

## 2016-06-24 MED ORDER — IBUPROFEN 800 MG PO TABS: 800.0000 mg | ORAL_TABLET | Freq: Three times a day (TID) | ORAL | 3 refills | Status: DC | PRN

## 2016-06-24 MED ORDER — NORGESTIMATE-ETH ESTRADIOL 0.25-35 MG-MCG PO TABS: 1.0000 | ORAL_TABLET | Freq: Every day | ORAL | 6 refills | Status: DC

## 2016-06-24 NOTE — Discharge Instructions (Signed)
Oral Contraception Information Oral contraceptive pills (OCPs) are medicines taken to prevent pregnancy. OCPs work by preventing the ovaries from releasing eggs. The hormones in OCPs also cause the cervical mucus to thicken, preventing the sperm from entering the uterus. The hormones also cause the uterine lining to become thin, not allowing a fertilized egg to attach to the inside of the uterus. OCPs are highly effective when taken exactly as prescribed. However, OCPs do not prevent sexually transmitted diseases (STDs). Safe sex practices, such as using condoms along with the pill, can help prevent STDs. Before taking the pill, you may have a physical exam and Pap test. Your health care provider may order blood tests. The health care provider will make sure you are a good candidate for oral contraception. Discuss with your health care provider the possible side effects of the OCP you may be prescribed. When starting an OCP, it can take 2 to 3 months for the body to adjust to the changes in hormone levels in your body. Types of oral contraception  The combination pill--This pill contains estrogen and progestin (synthetic progesterone) hormones. The combination pill comes in 21-day, 28-day, or 91-day packs. Some types of combination pills are meant to be taken continuously (365-day pills). With 21-day packs, you do not take pills for 7 days after the last pill. With 28-day packs, the pill is taken every day. The last 7 pills are without hormones. Certain types of pills have more than 21 hormone-containing pills. With 91-day packs, the first 84 pills contain both hormones, and the last 7 pills contain no hormones or contain estrogen only.  The minipill--This pill contains the progesterone hormone only. The pill is taken every day continuously. It is very important to take the pill at the same time each day. The minipill comes in packs of 28 pills. All 28 pills contain the hormone. Advantages of oral  contraceptive pills  Decreases premenstrual symptoms.  Treats menstrual period cramps.  Regulates the menstrual cycle.  Decreases a heavy menstrual flow.  May treatacne, depending on the type of pill.  Treats abnormal uterine bleeding.  Treats polycystic ovarian syndrome.  Treats endometriosis.  Can be used as emergency contraception. Things that can make oral contraceptive pills less effective OCPs can be less effective if:  You forget to take the pill at the same time every day.  You have a stomach or intestinal disease that lessens the absorption of the pill.  You take OCPs with other medicines that make OCPs less effective, such as antibiotics, certain HIV medicines, and some seizure medicines.  You take expired OCPs.  You forget to restart the pill on day 7, when using the packs of 21 pills. Risks associated with oral contraceptive pills Oral contraceptive pills can sometimes cause side effects, such as:  Headache.  Nausea.  Breast tenderness.  Irregular bleeding or spotting. Combination pills are also associated with a small increased risk of:  Blood clots.  Heart attack.  Stroke. This information is not intended to replace advice given to you by your health care provider. Make sure you discuss any questions you have with your health care provider. Document Released: 06/20/2002 Document Revised: 09/05/2015 Document Reviewed: 09/18/2012 Elsevier Interactive Patient Education  2017 [employer]vier Inc.     Abnormal Uterine Bleeding Abnormal uterine bleeding can affect women at various stages in life, including teenagers, women in their reproductive years, pregnant women, and women who have reached menopause. Several kinds of uterine bleeding are considered abnormal, including:  Bleeding or  spotting between periods.  Bleeding after sexual intercourse.  Bleeding that is heavier or more than normal.  Periods that last longer than usual.  Bleeding after  menopause. Many cases of abnormal uterine bleeding are minor and simple to treat, while others are more serious. Any type of abnormal bleeding should be evaluated by your health care provider. Treatment will depend on the cause of the bleeding. Follow these instructions at home: Monitor your condition for any changes. The following actions may help to alleviate any discomfort you are experiencing:  Avoid the use of tampons and douches as directed by your health care provider.  Change your pads frequently. You should get regular pelvic exams and Pap tests. Keep all follow-up appointments for diagnostic tests as directed by your health care provider. Contact a health care provider if:  Your bleeding lasts more than 1 week.  You feel dizzy at times. Get help right away if:  You pass out.  You are changing pads every 15 to 30 minutes.  You have abdominal pain.  You have a fever.  You become sweaty or weak.  You are passing large blood clots from the vagina.  You start to feel nauseous and vomit. This information is not intended to replace advice given to you by your health care provider. Make sure you discuss any questions you have with your health care provider. Document Released: 03/30/2005 Document Revised: 09/11/2015 Document Reviewed: 10/27/2012 Elsevier Interactive Patient Education  2017 [employer]

## 2016-06-24 NOTE — MAU Note (Signed)
Pt reports she was seen here 4 weeks ago for heavy bleeding and was put on BCP's and is now at the end of the pack of pills but she started a period on Sunday and it is very heavy and she is having severe lower abd cramping.

## 2016-06-24 NOTE — MAU Provider Note (Addendum)
Faculty Practice OB/GYN Attending MAU Note  Chief Complaint: Menorrhagia    First Provider Initiated Contact with Patient 06/24/16 2042      SUBJECTIVE Andrea [name] is a 26 y.o. G1P0101 who presents with heavy bleeding. Reports being on OCPs for AUB, started last month. Currently on last week of pills. Bleeding is associated with severe cramping 10/10 on a pain scale.  Reports passing clots per vagina. No dizziness, lightheadness or other symptoms.   Denies any abnormal vaginal discharge, fevers, chills, sweats, dysuria, nausea, vomiting, other GI or GU symptoms or other general symptoms.   Past Medical History:  Diagnosis Date  . Anemia    as a teenager  . Chicken pox   . Diabetes mellitus    type 2 on insulin   OB History  Gravida Para Term Preterm AB Living  1 1 0 1 0 1  SAB TAB Ectopic Multiple Live Births  0 0 0 0 1    # Outcome Date GA Lbr Len/2nd Weight Sex Delivery Anes PTL Lv  1 Preterm 09/16/15 [redacted]w[redacted]d  4 lb 4.8 oz (1.95 kg) M CS-LTranv EPI  LIV     Past Surgical History:  Procedure Laterality Date  . CESAREAN SECTION N/A 09/16/2015   Procedure: CESAREAN SECTION;  Surgeon: Lesly Dukes, MD;  Location: [facility] BIRTHING SUIT[facility];  Service: Obstetrics;  Laterality: N/A;  . FOREIGN BODY REMOVAL Left 08/28/2015   Procedure: REMOVAL FOREIGN BODY foot;  Surgeon: Dannielle Huh, MD;  Location: [facility] OR;  Service: Orthopedics;  Laterality: Left;  regional block  . NO PAST SURGERIES     Social History   Social History  . Marital status: Legally Separated    Spouse name: N/A  . Number of children: N/A  . Years of education: N/A   Occupational History  . Not on file.   Social History Main Topics  . Smoking status: Never Smoker  . Smokeless tobacco: Never Used  . Alcohol use Yes     Comment: occasionally - none while pregnant  . Drug use: No  . Sexual activity: Yes    Birth control/ protection: None   Other Topics Concern  . Not on file   Social History Narrative  . No  narrative on file   No current facility-administered medications on file prior to encounter.    Current Outpatient Prescriptions on File Prior to Encounter  Medication Sig Dispense Refill  . Insulin Glargine (LANTUS SOLOSTAR) 100 UNIT/ML Solostar Pen Inject 30 Units into the skin 2 (two) times daily. (Patient taking differently: Inject 70-80 Units into the skin at bedtime. Depends on BG) 5 pen 3  . insulin aspart (NOVOLOG FLEXPEN) 100 UNIT/ML FlexPen Inject 8 Units into the skin 3 (three) times daily with meals. (Patient not taking: Reported on 06/24/2016) 15 mL 11  . lovastatin (MEVACOR) 20 MG tablet Take 2 tablets (40 mg total) by mouth at bedtime. (Patient not taking: Reported on 06/24/2016) 60 tablet 11  . metroNIDAZOLE (FLAGYL) 500 MG tablet Take 1 tablet (500 mg total) by mouth 2 (two) times daily. (Patient not taking: Reported on 06/24/2016) 14 tablet 0   No Known Allergies  ROS: Pertinent items in HPI  OBJECTIVE BP 133/87 (BP Location: Left Arm)   Pulse 76   Temp 98.7 F (37.1 C) (Oral)   Resp 18   Ht 5\' 2"  (1.575 m)   Wt 258 lb (117 kg)   LMP 06/21/2016   SpO2 100%   BMI 47.19 kg/m  CONSTITUTIONAL: Well-developed,  well-nourished female in no acute distress.  HENT:  Normocephalic, atraumatic, External right and left ear normal. Oropharynx is clear and moist EYES: Conjunctivae and EOM are normal. Pupils are equal, round, and reactive to light. No scleral icterus.  NECK: Normal range of motion, supple, no masses.  Normal thyroid.  SKIN: Skin is warm and dry. No rash noted. Not diaphoretic. No erythema. No pallor. NEUROLGIC: Alert and oriented to person, place, and time. Normal reflexes, muscle tone coordination. No cranial nerve deficit noted. PSYCHIATRIC: Normal mood and affect. Normal behavior. Normal judgment and thought content. CARDIOVASCULAR: Normal heart rate noted RESPIRATORY: Effort and breath sounds normal, no problems with respiration noted. ABDOMEN: Soft, obese,  normal bowel sounds, no distention noted.  No tenderness, rebound or guarding.  PELVIC: Normal appearing external genitalia; normal appearing vaginal mucosa and cervix.  Bloody discharge noted; slow trickle of blood noted from cervix. Normal uterine size, no other palpable masses, no uterine or adnexal tenderness. MUSCULOSKELETAL: Normal range of motion. No tenderness.  No cyanosis, clubbing, or edema.  2+ distal pulses.  LAB RESULTS Results for orders placed or performed during the hospital encounter of 06/24/16 (from the past 48 hour(s))  Urinalysis, Routine w reflex microscopic     Status: Abnormal   Collection Time: 06/24/16  8:15 PM  Result Value Ref Range   Color, Urine ORANGE (A) YELLOW    Comment: BIOCHEMICALS MAY BE AFFECTED BY COLOR   APPearance CLEAR CLEAR   Specific Gravity, Urine 1.010 1.005 - 1.030   pH 7.0 5.0 - 8.0   Glucose, UA >=500 (A) NEGATIVE mg/dL   Hgb urine dipstick LARGE (A) NEGATIVE   Bilirubin Urine NEGATIVE NEGATIVE   Ketones, ur NEGATIVE NEGATIVE mg/dL   Protein, ur NEGATIVE NEGATIVE mg/dL   Nitrite NEGATIVE NEGATIVE   Leukocytes, UA NEGATIVE NEGATIVE  Urinalysis, Microscopic (reflex)     Status: Abnormal   Collection Time: 06/24/16  8:15 PM  Result Value Ref Range   RBC / HPF TOO NUMEROUS TO COUNT 0 - 5 RBC/hpf   WBC, UA 0-5 0 - 5 WBC/hpf   Bacteria, UA FEW (A) NONE SEEN   Squamous Epithelial / LPF 0-5 (A) NONE SEEN  Pregnancy, urine POC     Status: None   Collection Time: 06/24/16  8:26 PM  Result Value Ref Range   Preg Test, Ur NEGATIVE NEGATIVE    Comment:        THE SENSITIVITY OF THIS METHODOLOGY IS >24 mIU/mL   Glucose, capillary     Status: Abnormal   Collection Time: 06/24/16  8:43 PM  Result Value Ref Range   Glucose-Capillary 254 (H) 65 - 99 mg/dL    IMAGING No results found.  MAU COURSE Toradol 60 mg IM given  ASSESSMENT 1. Abnormal uterine bleeding   2. Dysmenorrhea   Advised to take OCPs in a continuous fashion. Advised to  take 2 active pills daily until bleeding stops (maximum of seven days) then one active pill daily. Ibuprofen ordered.  Outpatient pelvic ultrasound ordered; she will be called to schedule this She will follow up in [facility], she will be called with time and date of appointment. Bleeding precautions reviewed.  Follow up with PCP for management of DM. Discharged to home.  PLAN Discharge home Follow-up Information    Jaynie Collins, MD Follow up in 4 week(s).   Specialty:  Obstetrics and Gynecology Why:  You will be called with appointment date and time Contact information: [address] [place] [address]  [phone]          Allergies as of 06/24/2016   No Known Allergies     Medication List    STOP taking these medications   metroNIDAZOLE 500 MG tablet Commonly known as:  FLAGYL     TAKE these medications   ibuprofen 800 MG tablet Commonly known as:  ADVIL,MOTRIN Take 1 tablet (800 mg total) by mouth 3 (three) times daily with meals as needed for headache or moderate pain.   insulin aspart 100 UNIT/ML FlexPen Commonly known as:  NOVOLOG FLEXPEN Inject 8 Units into the skin 3 (three) times daily with meals.   Insulin Glargine 100 UNIT/ML Solostar Pen Commonly known as:  LANTUS SOLOSTAR Inject 30 Units into the skin 2 (two) times daily. What changed:  how much to take  when to take this  additional instructions   lovastatin 20 MG tablet Commonly known as:  MEVACOR Take 2 tablets (40 mg total) by mouth at bedtime.   norgestimate-ethinyl estradiol 0.25-35 MG-MCG tablet Commonly known as:  ORTHO-CYCLEN,SPRINTEC,PREVIFEM Take 1 tablet by mouth daily. Only take active pills in a continuous fashion. What changed:  additional instructions        Tereso NewcomerUgonna A Armond Cuthrell, MD 06/24/2016 9:38 PM

## 2016-06-26 ENCOUNTER — Telehealth: Payer: Self-pay | Admitting: *Deleted

## 2016-06-26 ENCOUNTER — Telehealth: Payer: Self-pay | Admitting: General Practice

## 2016-06-26 NOTE — Telephone Encounter (Signed)
PT CALLED WANTING HER MEDS, ASKED HER TO CALL THE MAIN NUMBER FOR ASSISTANCE

## 2016-06-26 NOTE — Telephone Encounter (Signed)
Pt states she cannot get her insulin because medicaid will not pay for it, called [facility], spoke w/ pharm at [facility], she ran them through and each will be $3.00, called pt, did not get an answer, lm that she may pick them up

## 2016-06-29 ENCOUNTER — Ambulatory Visit: Payer: Medicaid Other

## 2016-06-30 ENCOUNTER — Encounter: Payer: Self-pay | Admitting: General Practice

## 2016-06-30 ENCOUNTER — Ambulatory Visit: Payer: Medicaid Other

## 2016-07-09 ENCOUNTER — Encounter (HOSPITAL_COMMUNITY): Payer: Self-pay

## 2016-07-09 ENCOUNTER — Ambulatory Visit (HOSPITAL_COMMUNITY): Payer: Medicaid Other | Attending: Obstetrics & Gynecology

## 2016-07-14 ENCOUNTER — Ambulatory Visit: Payer: Medicaid Other

## 2016-07-22 ENCOUNTER — Ambulatory Visit (INDEPENDENT_AMBULATORY_CARE_PROVIDER_SITE_OTHER): Payer: Medicaid Other | Admitting: Internal Medicine

## 2016-07-22 ENCOUNTER — Encounter (HOSPITAL_COMMUNITY): Payer: Self-pay | Admitting: *Deleted

## 2016-07-22 ENCOUNTER — Encounter (INDEPENDENT_AMBULATORY_CARE_PROVIDER_SITE_OTHER): Payer: Self-pay

## 2016-07-22 VITALS — BP 148/83 | HR 85 | Temp 98.1°F | Ht 62.0 in | Wt 256.4 lb

## 2016-07-22 DIAGNOSIS — E1165 Type 2 diabetes mellitus with hyperglycemia: Secondary | ICD-10-CM | POA: Diagnosis not present

## 2016-07-22 DIAGNOSIS — IMO0001 Reserved for inherently not codable concepts without codable children: Secondary | ICD-10-CM

## 2016-07-22 DIAGNOSIS — Z794 Long term (current) use of insulin: Secondary | ICD-10-CM | POA: Diagnosis not present

## 2016-07-22 DIAGNOSIS — Z6841 Body Mass Index (BMI) 40.0 and over, adult: Secondary | ICD-10-CM | POA: Diagnosis not present

## 2016-07-22 LAB — GLUCOSE, CAPILLARY: GLUCOSE-CAPILLARY: 89 mg/dL (ref 65–99)

## 2016-07-22 MED ORDER — INSULIN GLARGINE 100 UNIT/ML SOLOSTAR PEN: 50.0000 [IU] | PEN_INJECTOR | Freq: Every day | SUBCUTANEOUS | 3 refills | Status: DC

## 2016-07-22 MED ORDER — INSULIN ASPART 100 UNIT/ML FLEXPEN: 12.0000 [IU] | PEN_INJECTOR | Freq: Three times a day (TID) | SUBCUTANEOUS | 11 refills | Status: DC

## 2016-07-22 MED ORDER — METFORMIN HCL ER 500 MG PO TB24: 500.0000 mg | ORAL_TABLET | Freq: Every day | ORAL | 2 refills | Status: DC

## 2016-07-22 NOTE — Patient Instructions (Addendum)
General Instructions: - Increase Lantus to 50 units at bedtime - Use Novolog 12 units three times daily with meals. DO NOT TAKE EXTRA NOVOLOG! - Start Metformin extended release 500 mg daily - Follow up in 1 month  Thank you for bringing your medicines today. This helps [place] keep you safe from mistakes.   Progress Toward Treatment Goals:  No flowsheet data found.  Self Care Goals & Plans:  No flowsheet data found.  No flowsheet data found.   Care Management & Community Referrals:  No flowsheet data found.

## 2016-07-22 NOTE — Progress Notes (Signed)
Pt denies SOB, chest pain, and being under the care of a cardiologist. Pt denies having a stress test, echo and cardiac cath. Pt denies having an EKG and chest x ray within the last year. Pt made aware to stop taking  Aspirin, vitamins, fish oil, weight loss supplement and herbal medications. Do not take any NSAIDs ie: Ibuprofen, Advil, Naproxen,BC and Goody Powder or any medication containing Aspirin. Pt made aware to take 40 units of Lantus the night before procedure, no Metformin or Novolog Insulin DOS, check BG every 2 hours prior to arrival, treat a BG < 70 with 4 ounces of apple or cranberry juice, wait 15 minutes after drinking juice to check BG again, if BG remains < 70 call the SS unit. Pt verbalized understanding of all pre-op instructions. Anesthesia asked to review pt abnormal lab of A1c >14.0 on 06/01/16.

## 2016-07-22 NOTE — Progress Notes (Signed)
   CC: Diabetes follow-up  HPI:  [name] is a 26 y.o. woman with past medical history as noted below who presents today for follow-up of her diabetes.  Type 2 diabetes: Last A1c greater than 14. Patient reports increased difficulty with blood sugar control since the birth of her son. She has made a great effort since her last visit in regards to her diet and exercise regimen. She reports going to the gym every day and walking 20 minutes per day on the treadmill. She has cut down on fatty foods and is eating more greens. She reports losing weight since her last visit and is very motivated to continue to lose weight. Her weight today is 256 pounds, down from her last visit when she weighed 262 pounds. She reports she is also taking an over-the-counter weight loss medication and feels this has been helpful in her weight loss. She reports her blood sugars have been mostly high in the 300s, but will occasionally have a good blood sugar in the 130s to 140s. Per review of her glucometer she has one hypoglycemic episode of 60. Patient admits she took extra NovoLog at bedtime when she saw her blood sugar was elevated at 282 at midnight which resulted in the low blood sugar. She is currently taking Lantus 40 units at bedtime and NovoLog 12-20 units 3 times daily with meals. She bases her NovoLog dose on her blood sugar. She admits to sometimes forgetting to take her Lantus at bedtime. She reports she used to be on metformin several years ago, but did not like the GI side effects.  Past Medical History:  Diagnosis Date  . Anemia    as a teenager  . Chicken pox   . Diabetes mellitus    type 2 on insulin    Review of Systems:   General: Denies fever, chills, night sweats, changes in weight, changes in appetite HEENT: Denies headaches, ear pain, changes in vision, rhinorrhea, sore throat CV: Denies CP, palpitations, SOB, orthopnea Pulm: Denies SOB, cough, wheezing GI: Denies abdominal pain,  nausea, vomiting, diarrhea, constipation, melena, hematochezia GU: Denies dysuria, hematuria, frequency Msk: Denies muscle cramps, joint pains Neuro: Denies weakness, numbness, tingling Skin: Denies rashes, bruising Psych: Denies depression, anxiety, hallucinations  Physical Exam:  Vitals:   07/22/16 1335  BP: (!) 148/83  Pulse: 85  Temp: 98.1 F (36.7 C)  TempSrc: Oral  SpO2: 100%  Weight: 256 lb 6.4 oz (116.3 kg)  Height:  (1.575 m)   General: Young obese woman in NAD CV: RRR, no m/g/r Pulm: CTA bilaterally, breaths non-labored  Assessment & Plan:   See Encounters Tab for problem based charting.  Patient discussed with Dr. Heide Spark

## 2016-07-23 LAB — MICROALBUMIN / CREATININE URINE RATIO: CREATININE, UR: 77.6 mg/dL

## 2016-07-23 NOTE — Assessment & Plan Note (Addendum)
Blood glucose control remains uncontrolled with most blood sugars in the 300s. She did have episode of hypoglycemia but this was due to insulin stacking. Will have her increase Lantus to 50 units QHS and do Novolog 12 units TID with meals. Advised her to avoid taking extra Novolog at non-meal times to prevent low blood sugars, especially at bedtime. Recommended for her to increase Novolog up to 15 units at meals if she notices blood sugars are remaining high. Will have her start Metformin ER 500 mg daily. She was congratulated on her lifestyle modifications and encouraged to continue her daily gym visits and continue eating well. She will follow up in 2 weeks for reassessment of her blood glucose control.

## 2016-07-23 NOTE — Assessment & Plan Note (Signed)
Patient commended for her motivation and lifestyle changes. She is going to the gym daily and walking on the treadmill for at least 20 minutes. She has lost 6 lbs since her last visit. Advised her to avoid taking the weight loss medication, especially if she plans to become pregnant. She may be a good candidate for bariatric surgery given her age, her BMI is 107, and she has diabetes as a comorbidity. Will assess her interest in bariatric surgery at her next visit. Encouraged her to continue with her lifestyle changes.

## 2016-07-23 NOTE — Progress Notes (Signed)
Anesthesia Chart Review: SAME DAY WORK-UP. I am told by Dr. Randa Evens office that this was a late add on due to scheduling changes.   Patient is a 26 year old female scheduled for extractions of molars on 07/24/16 by Dr. Barbette Merino.    History includes never smoker, DM2, anemia, c-section 09/15/15. BMI is consistent with morbid obesity.    PCP is with the [facility]. Patient's A1c was > 14.0 on 06/01/16. Lantus and Novolog (SSI) doses were adjusted at that time. She had follow-up with Dr. Rich Number on 07/22/16. She reported blood sugars "mostly high in the 300s, but will occasionally have a good blood sugar in the 130s to 140s." Note is not yet signed, but medication changes included increasing Lantus to 50 Units at bedtime, using Novolog 12 units TID with meals, and starting Metformin ER 500 mg daily. Random CBG at that visit was 89. Her A1c was not rechecked since last result was within two months.   I notified Sam at Dr. Randa Evens office of patient's last A1c and glucose trends as reported in her PCP visit from yesterday. Patient has had two insulin adjustments since her A1c, but one adjustment was just yesterday. I notified Sam that patient is a increased risk to get cancelled on the day of surgery if she arrives with significant hyperglycemia. She will review with Dr. Barbette Merino.  Velna Ochs [facility]/Anesthesiology Phone [phone] 07/23/2016 10:16 AM

## 2016-07-24 ENCOUNTER — Other Ambulatory Visit: Payer: Self-pay

## 2016-07-24 ENCOUNTER — Encounter (HOSPITAL_COMMUNITY)
Admission: RE | Disposition: A | Discharge status: Home / Self Care | Payer: Self-pay | Source: Ambulatory Visit | Attending: Oral Surgery

## 2016-07-24 ENCOUNTER — Ambulatory Visit (HOSPITAL_COMMUNITY)
Admission: RE | Admit: 2016-07-24 | Discharge: 2016-07-24 | Disposition: A | Discharge status: Home / Self Care | Payer: Medicaid Other | Source: Ambulatory Visit | Attending: Oral Surgery | Admitting: Oral Surgery

## 2016-07-24 ENCOUNTER — Ambulatory Visit (HOSPITAL_COMMUNITY): Payer: Medicaid Other | Admitting: Certified Registered Nurse Anesthetist

## 2016-07-24 ENCOUNTER — Encounter (HOSPITAL_COMMUNITY): Payer: Self-pay

## 2016-07-24 DIAGNOSIS — Z793 Long term (current) use of hormonal contraceptives: Secondary | ICD-10-CM | POA: Diagnosis not present

## 2016-07-24 DIAGNOSIS — Z791 Long term (current) use of non-steroidal anti-inflammatories (NSAID): Secondary | ICD-10-CM | POA: Diagnosis not present

## 2016-07-24 DIAGNOSIS — E119 Type 2 diabetes mellitus without complications: Secondary | ICD-10-CM | POA: Diagnosis not present

## 2016-07-24 DIAGNOSIS — K011 Impacted teeth: Secondary | ICD-10-CM | POA: Insufficient documentation

## 2016-07-24 DIAGNOSIS — Z794 Long term (current) use of insulin: Secondary | ICD-10-CM | POA: Insufficient documentation

## 2016-07-24 DIAGNOSIS — Z8619 Personal history of other infectious and parasitic diseases: Secondary | ICD-10-CM | POA: Diagnosis not present

## 2016-07-24 DIAGNOSIS — Z6841 Body Mass Index (BMI) 40.0 and over, adult: Secondary | ICD-10-CM | POA: Insufficient documentation

## 2016-07-24 HISTORY — DX: Impacted teeth: K01.1

## 2016-07-24 HISTORY — PX: TOOTH EXTRACTION: SHX859

## 2016-07-24 LAB — CBC
HCT: 33.4 % — ABNORMAL LOW (ref 36.0–46.0)
HEMOGLOBIN: 10.9 g/dL — AB (ref 12.0–15.0)
MCH: 22.9 pg — AB (ref 26.0–34.0)
MCHC: 32.6 g/dL (ref 30.0–36.0)
MCV: 70.3 fL — AB (ref 78.0–100.0)
Platelets: 400 10*3/uL (ref 150–400)
RBC: 4.75 MIL/uL (ref 3.87–5.11)
RDW: 15.8 % — ABNORMAL HIGH (ref 11.5–15.5)
WBC: 6.6 10*3/uL (ref 4.0–10.5)

## 2016-07-24 LAB — BASIC METABOLIC PANEL
Anion gap: 10 (ref 5–15)
BUN: 8 mg/dL (ref 6–20)
CHLORIDE: 104 mmol/L (ref 101–111)
CO2: 23 mmol/L (ref 22–32)
Calcium: 8.6 mg/dL — ABNORMAL LOW (ref 8.9–10.3)
Creatinine, Ser: 0.76 mg/dL (ref 0.44–1.00)
GFR calc Af Amer: >60 mL/min (ref >60)
GFR calc non Af Amer: >60 mL/min (ref >60)
Glucose, Bld: 158 mg/dL — ABNORMAL HIGH (ref 65–99)
POTASSIUM: 3.6 mmol/L (ref 3.5–5.1)
SODIUM: 137 mmol/L (ref 135–145)

## 2016-07-24 LAB — HCG, SERUM, QUALITATIVE: PREG SERUM: NEGATIVE

## 2016-07-24 LAB — GLUCOSE, CAPILLARY
GLUCOSE-CAPILLARY: 160 mg/dL — AB (ref 65–99)
Glucose-Capillary: 206 mg/dL — ABNORMAL HIGH (ref 65–99)

## 2016-07-24 SURGERY — EXTRACTION, TOOTH, MOLAR
Anesthesia: General | Site: Mouth

## 2016-07-24 MED ORDER — FENTANYL CITRATE (PF) 100 MCG/2ML IJ SOLN: 25.0000 ug | INTRAMUSCULAR | Status: DC | PRN

## 2016-07-24 MED ORDER — SODIUM CHLORIDE 0.9 % IR SOLN
Status: DC | PRN
  Administered 2016-07-24: 1000 mL

## 2016-07-24 MED ORDER — ONDANSETRON HCL 4 MG/2ML IJ SOLN
INTRAMUSCULAR | Status: DC | PRN
  Administered 2016-07-24: 4 mg via INTRAVENOUS

## 2016-07-24 MED ORDER — OXYMETAZOLINE HCL 0.05 % NA SOLN
1.0000 | Freq: Two times a day (BID) | NASAL | Status: DC
  Administered 2016-07-24: 1 via NASAL

## 2016-07-24 MED ORDER — OXYMETAZOLINE HCL 0.05 % NA SOLN
NASAL | Status: AC
  Filled 2016-07-24: qty 15

## 2016-07-24 MED ORDER — LIDOCAINE-EPINEPHRINE 2 %-1:100000 IJ SOLN
INTRAMUSCULAR | Status: DC | PRN
  Administered 2016-07-24: 14 mL via INTRADERMAL

## 2016-07-24 MED ORDER — PROPOFOL 10 MG/ML IV BOLUS
INTRAVENOUS | Status: AC
  Filled 2016-07-24: qty 20

## 2016-07-24 MED ORDER — FENTANYL CITRATE (PF) 250 MCG/5ML IJ SOLN
INTRAMUSCULAR | Status: AC
  Filled 2016-07-24: qty 5

## 2016-07-24 MED ORDER — METOCLOPRAMIDE HCL 5 MG/ML IJ SOLN: 10.0000 mg | Freq: Once | INTRAMUSCULAR | Status: DC | PRN

## 2016-07-24 MED ORDER — PROPOFOL 10 MG/ML IV BOLUS
INTRAVENOUS | Status: DC | PRN
  Administered 2016-07-24: 200 mg via INTRAVENOUS

## 2016-07-24 MED ORDER — OXYCODONE-ACETAMINOPHEN 5-325 MG PO TABS: 1.0000 | ORAL_TABLET | ORAL | 0 refills | Status: DC | PRN

## 2016-07-24 MED ORDER — ONDANSETRON HCL 4 MG PO TABS: 4.0000 mg | ORAL_TABLET | Freq: Three times a day (TID) | ORAL | 0 refills | Status: DC | PRN

## 2016-07-24 MED ORDER — FENTANYL CITRATE (PF) 100 MCG/2ML IJ SOLN
INTRAMUSCULAR | Status: DC | PRN
  Administered 2016-07-24: 100 ug via INTRAVENOUS
  Administered 2016-07-24 (×3): 50 ug via INTRAVENOUS

## 2016-07-24 MED ORDER — MIDAZOLAM HCL 2 MG/2ML IJ SOLN
INTRAMUSCULAR | Status: AC
  Filled 2016-07-24: qty 2

## 2016-07-24 MED ORDER — HYDROCODONE-ACETAMINOPHEN 7.5-325 MG PO TABS: 1.0000 | ORAL_TABLET | Freq: Once | ORAL | Status: DC | PRN

## 2016-07-24 MED ORDER — LACTATED RINGERS IV SOLN
INTRAVENOUS | Status: DC
  Administered 2016-07-24: 07:00:00 via INTRAVENOUS

## 2016-07-24 MED ORDER — SUGAMMADEX SODIUM 200 MG/2ML IV SOLN
INTRAVENOUS | Status: DC | PRN
  Administered 2016-07-24: 200 mg via INTRAVENOUS

## 2016-07-24 MED ORDER — LIDOCAINE HCL (CARDIAC) 20 MG/ML IV SOLN
INTRAVENOUS | Status: DC | PRN
  Administered 2016-07-24: 80 mg via INTRAVENOUS

## 2016-07-24 MED ORDER — ESMOLOL HCL 100 MG/10ML IV SOLN
INTRAVENOUS | Status: DC | PRN
  Administered 2016-07-24: 20 mg via INTRAVENOUS

## 2016-07-24 MED ORDER — ROCURONIUM BROMIDE 100 MG/10ML IV SOLN
INTRAVENOUS | Status: DC | PRN
  Administered 2016-07-24: 40 mg via INTRAVENOUS

## 2016-07-24 MED ORDER — ALBUTEROL SULFATE HFA 108 (90 BASE) MCG/ACT IN AERS
INHALATION_SPRAY | RESPIRATORY_TRACT | Status: DC | PRN
  Administered 2016-07-24: 2 via RESPIRATORY_TRACT

## 2016-07-24 MED ORDER — MIDAZOLAM HCL 5 MG/5ML IJ SOLN
INTRAMUSCULAR | Status: DC | PRN
  Administered 2016-07-24: 2 mg via INTRAVENOUS

## 2016-07-24 MED ORDER — OXYMETAZOLINE HCL 0.05 % NA SOLN
NASAL | Status: DC | PRN
  Administered 2016-07-24: 1

## 2016-07-24 MED ORDER — MEPERIDINE HCL 25 MG/ML IJ SOLN: 6.2500 mg | INTRAMUSCULAR | Status: DC | PRN

## 2016-07-24 MED ORDER — CEFAZOLIN SODIUM-DEXTROSE 2-4 GM/100ML-% IV SOLN
2.0000 g | INTRAVENOUS | Status: AC
  Administered 2016-07-24: 2 g via INTRAVENOUS
  Filled 2016-07-24: qty 100

## 2016-07-24 SURGICAL SUPPLY — 30 items
BLADE SURG 15 STRL LF DISP TIS (BLADE) ×1 IMPLANT
BLADE SURG 15 STRL SS (BLADE) ×3
BUR CROSS CUT FISSURE 1.6 (BURR) ×2 IMPLANT
BUR CROSS CUT FISSURE 1.6MM (BURR) ×1
CANISTER SUCT 3000ML PPV (MISCELLANEOUS) ×3 IMPLANT
COVER SURGICAL LIGHT HANDLE (MISCELLANEOUS) ×3 IMPLANT
DECANTER SPIKE VIAL GLASS SM (MISCELLANEOUS) IMPLANT
GAUZE PACKING FOLDED 2  STR (GAUZE/BANDAGES/DRESSINGS) ×2
GAUZE PACKING FOLDED 2 STR (GAUZE/BANDAGES/DRESSINGS) ×1 IMPLANT
GAUZE SPONGE 4X4 16PLY XRAY LF (GAUZE/BANDAGES/DRESSINGS) IMPLANT
GLOVE BIO SURGEON STRL SZ 6.5 (GLOVE) ×2 IMPLANT
GLOVE BIO SURGEON STRL SZ7.5 (GLOVE) ×3 IMPLANT
GLOVE BIO SURGEONS STRL SZ 6.5 (GLOVE) ×1
GLOVE BIOGEL PI IND STRL 7.0 (GLOVE) ×1 IMPLANT
GLOVE BIOGEL PI INDICATOR 7.0 (GLOVE) ×2
GOWN STRL REUS W/ TWL LRG LVL3 (GOWN DISPOSABLE) ×1 IMPLANT
GOWN STRL REUS W/ TWL XL LVL3 (GOWN DISPOSABLE) ×1 IMPLANT
GOWN STRL REUS W/TWL LRG LVL3 (GOWN DISPOSABLE) ×3
GOWN STRL REUS W/TWL XL LVL3 (GOWN DISPOSABLE) ×3
KIT BASIN OR (CUSTOM PROCEDURE TRAY) ×3 IMPLANT
KIT ROOM TURNOVER OR (KITS) ×3 IMPLANT
NEEDLE 22X1 1/2 (OR ONLY) (NEEDLE) ×3 IMPLANT
NS IRRIG 1000ML POUR BTL (IV SOLUTION) ×3 IMPLANT
PAD ARMBOARD 7.5X6 YLW CONV (MISCELLANEOUS) ×6 IMPLANT
SUT CHROMIC 3 0 PS 2 (SUTURE) ×6 IMPLANT
SYR CONTROL 10ML LL (SYRINGE) ×3 IMPLANT
TOWEL OR 17X26 10 PK STRL BLUE (TOWEL DISPOSABLE) ×3 IMPLANT
TRAY ENT MC OR (CUSTOM PROCEDURE TRAY) ×3 IMPLANT
TUBING IRRIGATION (MISCELLANEOUS) IMPLANT
YANKAUER SUCT BULB TIP NO VENT (SUCTIONS) ×3 IMPLANT

## 2016-07-24 NOTE — Anesthesia Procedure Notes (Signed)
Procedure Name: Intubation Date/Time: 07/24/2016 8:52 AM Performed by: Shirlyn Goltz Pre-anesthesia Checklist: Patient identified, Emergency Drugs available, Suction available and Patient being monitored Patient Re-evaluated:Patient Re-evaluated prior to inductionOxygen Delivery Method: Circle system utilized Preoxygenation: Pre-oxygenation with 100% oxygen Intubation Type: IV induction Ventilation: Mask ventilation without difficulty Laryngoscope Size: Mac and 3 Grade View: Grade I Nasal Tubes: Right, Nasal prep performed and Nasal Rae Tube size: 6.5 mm Number of attempts: 1 Placement Confirmation: ETT inserted through vocal cords under direct vision,  positive ETCO2 and breath sounds checked- equal and bilateral Tube secured with: Tape Dental Injury: Bloody posterior oropharynx

## 2016-07-24 NOTE — Anesthesia Postprocedure Evaluation (Signed)
Anesthesia Post Note  Patient: Andrea [name]  Procedure(s) Performed: Procedure(s) (LRB): EXTRACTION WISDOM TEETH ONE, SIXTEEN, SEVENTEEN AND THIRTY-TWO (N/A)  Patient location during evaluation: PACU Anesthesia Type: General Level of consciousness: awake and alert Pain management: pain level controlled Vital Signs Assessment: post-procedure vital signs reviewed and stable Respiratory status: spontaneous breathing, nonlabored ventilation and respiratory function stable Cardiovascular status: blood pressure returned to baseline and stable Postop Assessment: no signs of nausea or vomiting Anesthetic complications: no       Last Vitals:  Vitals:   07/24/16 0948 07/24/16 1002  BP: (!) 137/59 132/73  Pulse: 97 88  Resp: (!) 21 (!) 23  Temp:      Last Pain:  Vitals:   07/24/16 0945  TempSrc:   PainSc: 0-No pain                 Tennille Montelongo A.

## 2016-07-24 NOTE — Anesthesia Preprocedure Evaluation (Signed)
Anesthesia Evaluation  Patient identified by MRN, date of birth, ID band  Reviewed: Allergy & Precautions, NPO status , Patient's Chart, lab work & pertinent test results  Airway Mallampati: III  TM Distance: >3 FB Neck ROM: Full    Dental no notable dental hx. (+) Teeth Intact   Pulmonary neg pulmonary ROS,    Pulmonary exam normal breath sounds clear to auscultation       Cardiovascular Normal cardiovascular exam Rhythm:Regular Rate:Normal     Neuro/Psych negative neurological ROS  negative psych ROS   GI/Hepatic Neg liver ROS, Impacted 3rd molars   Endo/Other  diabetes, Well Controlled, Type 2, Insulin DependentMorbid obesity  Renal/GU negative Renal ROS  negative genitourinary   Musculoskeletal negative musculoskeletal ROS (+)   Abdominal (+) + obese,   Peds  Hematology  (+) anemia ,   Anesthesia Other Findings   Reproductive/Obstetrics negative OB ROS                             Anesthesia Physical Anesthesia Plan  ASA: III  Anesthesia Plan: General   Post-op Pain Management:    Induction: Intravenous  Airway Management Planned: Nasal ETT  Additional Equipment:   Intra-op Plan:   Post-operative Plan: Extubation in OR  Informed Consent: I have reviewed the patients History and Physical, chart, labs and discussed the procedure including the risks, benefits and alternatives for the proposed anesthesia with the patient or authorized representative who has indicated his/her understanding and acceptance.   Dental advisory given  Plan Discussed with: CRNA, Anesthesiologist and Surgeon  Anesthesia Plan Comments:         Anesthesia Quick Evaluation

## 2016-07-24 NOTE — Op Note (Signed)
Andrea [name], Andrea [name] NO.:  [id number]  MEDICAL RECORD NO.:  [id number]  LOCATION:  [facility]                         FACILITY:  [facility]  PHYSICIAN:  [name], M.D.  DATE OF BIRTH:  1990/05/16  DATE OF PROCEDURE:  07/24/2016 DATE OF DISCHARGE:                              OPERATIVE REPORT   PREOPERATIVE DIAGNOSIS:  Impacted and nonrestorable teeth #1, #16, #17, #32.  POSTOPERATIVE DIAGNOSIS:  Impacted and nonrestorable teeth #1, #16, #17, #32.  PROCEDURE:  Extraction teeth #1, #16, #17, #32.  SURGEON:  [name], M.D.  ANESTHESIA:  General nasal intubation, Dr. Malen Gauze attending.  DESCRIPTION OF PROCEDURE:  The patient was taken to the operating room, placed on the table in supine position.  General anesthesia was administered intravenously, and a nasal endotracheal tube was placed and secured.  The eyes were protected, and the patient was draped for surgery.  Time-out was performed.  The posterior pharynx was suctioned. A throat pack was placed.  A 2% lidocaine with 1:100,000 epinephrine was infiltrated in an inferior alveolar block on the right and left sides and in the right and left posterior maxilla around teeth #1 and #16, total of 14 mL was utilized.  A bite block was placed in the right side of the mouth, and sweetheart retractor was used to retract the tongue. A 15 blade was used to make an incision overlying tooth #17 which was partially impacted into the bone, and a 15 blade was used to make an incision circumferentially around tooth #16.  The periosteum was reflected from around these teeth.  Bone was removed buccally around #17, and the tooth was sectioned and removed in pieces with a 301 elevator.  Tooth #16 was elevated with a 301 elevator, and the tooth was removed with the upper universal forceps.  The mesiobuccal root fractured and required removal of additional bone, and the use of a root tip pick to retrieve the root.  Then, these  2 sockets were curetted, irrigated, and closed with 3-0 chromic.  Then, the bite block and sweetheart retractor were repositioned to the other side of the mouth. A 15 blade was used to make an incision around teeth #1 and #32. Periosteum was reflected, and the teeth were elevated with a 301 elevator.  Tooth #32 was removed atraumatically with the #17 forceps and then tooth #1 was removed using a 150 forceps.  However, the distal buccal root fractured.  This was removed using the Stryker handpiece to remove additional bone, and then, the root tip pick was used to retrieve the root.  Then, the areas were irrigated and closed with 3-0 chromic. The oral cavity was then inspected, irrigated, and suctioned.  The patient was left in the care of anesthesia for awakening and transportation to recovery room after the throat pack was removed.  ESTIMATED BLOOD LOSS:  Minimal.  COMPLICATIONS:  None.  SPECIMENS:  None.     [name], M.D.     SMJ/MEDQ  D:  07/24/2016  T:  07/24/2016  Job:  [id number]

## 2016-07-24 NOTE — H&P (Signed)
HISTORY AND PHYSICAL  Andrea [name] is a 26 y.o. female patient with CC: painful wisdom teeth  No diagnosis found.  Past Medical History:  Diagnosis Date  . Anemia    as a teenager  . Chicken pox   . Diabetes mellitus    type 2 on insulin  . Impacted teeth with abnormal position    impacted wisdom teeth    Current Facility-Administered Medications  Medication Dose Route Frequency Provider Last Rate Last Dose  . ceFAZolin (ANCEF) IVPB 2g/100 mL premix  2 g Intravenous To SS-Surg Ocie Doyne, DDS       Allergies  Allergen Reactions  . No Known Allergies    Active Problems:   * No active hospital problems. *  Vitals: Blood pressure 136/86, pulse 86, temperature 98.6 F (37 C), temperature source Oral, resp. rate 18, height  (1.575 m), weight 116.1 kg (256 lb), SpO2 100 %, currently breastfeeding. Lab results: Results for orders placed or performed during the hospital encounter of 07/24/16 (from the past 24 hour(s))  CBC     Status: Abnormal   Collection Time: 07/24/16  6:54 AM  Result Value Ref Range   WBC 6.6 4.0 - 10.5 K/uL   RBC 4.75 3.87 - 5.11 MIL/uL   Hemoglobin 10.9 (L) 12.0 - 15.0 g/dL   HCT 16.1 (L) 09.6 - 04.5 %   MCV 70.3 (L) 78.0 - 100.0 fL   MCH 22.9 (L) 26.0 - 34.0 pg   MCHC 32.6 30.0 - 36.0 g/dL   RDW 40.9 (H) 81.1 - 91.4 %   Platelets 400 150 - 400 K/uL  Glucose, capillary     Status: Abnormal   Collection Time: 07/24/16  7:13 AM  Result Value Ref Range   Glucose-Capillary 160 (H) 65 - 99 mg/dL   Radiology Results: No results found. General appearance: alert, cooperative and morbidly obese Head: Normocephalic, without obvious abnormality, atraumatic Eyes: negative Nose: Nares normal. Septum midline. Mucosa normal. No drainage or sinus tenderness. Throat: lips, mucosa, and tongue normal; teeth and gums normal and decayed and impacted wisdom teeth.  Neck: no adenopathy, supple, symmetrical, trachea midline and thyroid not enlarged, symmetric,  no tenderness/mass/nodules Resp: clear to auscultation bilaterally Cardio: regular rate and rhythm, S1, S2 normal, no murmur, click, rub or gallop  Assessment:Nonrestorable wisdom teeth 1, 16, 17, 32  Plan: Extraction teeth 1, 16, 17, 32, GA, Day surgery.   Andrea [name] 07/24/2016

## 2016-07-24 NOTE — Transfer of Care (Signed)
Immediate Anesthesia Transfer of Care Note  Patient: Andrea [name]  Procedure(s) Performed: Procedure(s): EXTRACTION WISDOM TEETH ONE, SIXTEEN, SEVENTEEN AND THIRTY-TWO (N/A)  Patient Location: PACU  Anesthesia Type:General  Level of Consciousness: awake, alert , oriented and patient cooperative  Airway & Oxygen Therapy: Patient Spontanous Breathing and Patient connected to face mask oxygen  Post-op Assessment: Report given to RN and Post -op Vital signs reviewed and stable  Post vital signs: Reviewed and stable  Last Vitals:  Vitals:   07/24/16 0716 07/24/16 0933  BP: 136/86   Pulse: 86   Resp: 18   Temp: 37 C (P) 36.3 C    Last Pain:  Vitals:   07/24/16 0716  TempSrc: Oral  PainSc:       Patients Stated Pain Goal: 4 (07/24/16 0700)  Complications: No apparent anesthesia complications

## 2016-07-24 NOTE — Op Note (Signed)
07/24/2016  9:20 AM  PATIENT:  Minette Brine  26 y.o. female  PRE-OPERATIVE DIAGNOSIS:  IMPACTED WISDOM TEETH # 1, 16, 17, 324 POST-OPERATIVE DIAGNOSIS:  SAME  PROCEDURE:  Procedure(s): EXTRACTION WISDOM TEETH ONE, SIXTEEN, SEVENTEEN AND THIRTY-TWO  SURGEON:  Surgeon(s): Ocie Doyne, DDS  ANESTHESIA:   local and general  EBL:  minimal  DRAINS: none   SPECIMEN:  No Specimen  COUNTS:  YES  PLAN OF CARE: Discharge to home after PACU  PATIENT DISPOSITION:  PACU - hemodynamically stable.   PROCEDURE DETAILS: Dictation #[id number] [name], DMD 07/24/2016 9:20 AM

## 2016-07-25 ENCOUNTER — Encounter (HOSPITAL_COMMUNITY): Payer: Self-pay | Admitting: Oral Surgery

## 2016-07-28 NOTE — Progress Notes (Signed)
Internal Medicine Clinic Attending  Case discussed with Dr. Rivet at the time of the visit.  We reviewed the resident's history and exam and pertinent patient test results.  I agree with the assessment, diagnosis, and plan of care documented in the resident's note.  

## 2016-07-29 ENCOUNTER — Other Ambulatory Visit: Payer: Self-pay

## 2016-07-29 DIAGNOSIS — Z794 Long term (current) use of insulin: Principal | ICD-10-CM

## 2016-07-29 DIAGNOSIS — IMO0001 Reserved for inherently not codable concepts without codable children: Secondary | ICD-10-CM

## 2016-07-29 DIAGNOSIS — E1165 Type 2 diabetes mellitus with hyperglycemia: Principal | ICD-10-CM

## 2016-07-29 NOTE — Telephone Encounter (Signed)
   insulin aspart (NOVOLOG FLEXPEN) 100 UNIT/ML FlexPen, Insulin Glargine (LANTUS SOLOSTAR) 100 UNIT/ML Solostar Pen, refill request @ [facility] on [address].

## 2016-07-30 ENCOUNTER — Ambulatory Visit: Payer: Medicaid Other | Admitting: Obstetrics & Gynecology

## 2016-07-30 ENCOUNTER — Encounter: Payer: Self-pay | Admitting: *Deleted

## 2016-07-30 NOTE — Progress Notes (Signed)
Pt did not keep appointment today. Per Dr. Anywanwu no need to call patient, if she calls she may reschedule if desired.  

## 2016-08-03 MED ORDER — INSULIN ASPART 100 UNIT/ML FLEXPEN: 12.0000 [IU] | PEN_INJECTOR | Freq: Three times a day (TID) | SUBCUTANEOUS | 11 refills | Status: DC

## 2016-08-03 MED ORDER — INSULIN GLARGINE 100 UNIT/ML SOLOSTAR PEN: 50.0000 [IU] | PEN_INJECTOR | Freq: Every day | SUBCUTANEOUS | 3 refills | Status: DC

## 2016-08-05 ENCOUNTER — Other Ambulatory Visit: Payer: Self-pay

## 2016-11-05 ENCOUNTER — Ambulatory Visit: Payer: Medicaid Other | Admitting: Pharmacist

## 2016-11-05 ENCOUNTER — Other Ambulatory Visit: Payer: Self-pay | Admitting: Pharmacist

## 2016-11-05 ENCOUNTER — Telehealth: Payer: Self-pay | Admitting: *Deleted

## 2016-11-05 DIAGNOSIS — E1165 Type 2 diabetes mellitus with hyperglycemia: Principal | ICD-10-CM

## 2016-11-05 DIAGNOSIS — IMO0001 Reserved for inherently not codable concepts without codable children: Secondary | ICD-10-CM

## 2016-11-05 DIAGNOSIS — Z794 Long term (current) use of insulin: Principal | ICD-10-CM

## 2016-11-05 NOTE — Telephone Encounter (Signed)
Patient ran out of her insulins. Has refills @ [facility] but unable to afford. Per Dr. Selena BattenKim, patient can come to see her for samples & [facility]. Informed patient & will be here today.

## 2016-11-06 MED ORDER — METFORMIN HCL ER 500 MG PO TB24: 500.0000 mg | ORAL_TABLET | Freq: Every day | ORAL | 2 refills | Status: DC

## 2016-11-20 ENCOUNTER — Encounter: Payer: Medicaid Other | Admitting: Internal Medicine

## 2016-11-20 ENCOUNTER — Encounter: Payer: Self-pay | Admitting: Internal Medicine

## 2017-01-14 ENCOUNTER — Emergency Department (HOSPITAL_COMMUNITY)
Admission: EM | Admit: 2017-01-14 | Discharge: 2017-01-14 | Disposition: A | Discharge status: Home / Self Care | Payer: Self-pay | Attending: Emergency Medicine | Admitting: Emergency Medicine

## 2017-01-14 ENCOUNTER — Encounter (HOSPITAL_COMMUNITY): Payer: Self-pay | Admitting: Emergency Medicine

## 2017-01-14 ENCOUNTER — Emergency Department (HOSPITAL_COMMUNITY): Payer: Self-pay

## 2017-01-14 DIAGNOSIS — Z794 Long term (current) use of insulin: Secondary | ICD-10-CM | POA: Insufficient documentation

## 2017-01-14 DIAGNOSIS — E119 Type 2 diabetes mellitus without complications: Secondary | ICD-10-CM | POA: Insufficient documentation

## 2017-01-14 DIAGNOSIS — R112 Nausea with vomiting, unspecified: Secondary | ICD-10-CM | POA: Insufficient documentation

## 2017-01-14 DIAGNOSIS — R197 Diarrhea, unspecified: Secondary | ICD-10-CM | POA: Insufficient documentation

## 2017-01-14 LAB — COMPREHENSIVE METABOLIC PANEL
ALBUMIN: 4.1 g/dL (ref 3.5–5.0)
ALK PHOS: 61 U/L (ref 38–126)
ALT: 15 U/L (ref 14–54)
ANION GAP: 10 (ref 5–15)
AST: 22 U/L (ref 15–41)
BUN: 9 mg/dL (ref 6–20)
CALCIUM: 8.8 mg/dL — AB (ref 8.9–10.3)
CO2: 23 mmol/L (ref 22–32)
Chloride: 106 mmol/L (ref 101–111)
Creatinine, Ser: 0.68 mg/dL (ref 0.44–1.00)
GFR calc Af Amer: >60 mL/min (ref >60)
GFR calc non Af Amer: >60 mL/min (ref >60)
GLUCOSE: 91 mg/dL (ref 65–99)
POTASSIUM: 4.1 mmol/L (ref 3.5–5.1)
SODIUM: 139 mmol/L (ref 135–145)
Total Bilirubin: 0.4 mg/dL (ref 0.3–1.2)
Total Protein: 7.7 g/dL (ref 6.5–8.1)

## 2017-01-14 LAB — CBC
HEMATOCRIT: 31.5 % — AB (ref 36.0–46.0)
HEMOGLOBIN: 10.2 g/dL — AB (ref 12.0–15.0)
MCH: 22.4 pg — AB (ref 26.0–34.0)
MCHC: 32.4 g/dL (ref 30.0–36.0)
MCV: 69.1 fL — ABNORMAL LOW (ref 78.0–100.0)
Platelets: 441 10*3/uL — ABNORMAL HIGH (ref 150–400)
RBC: 4.56 MIL/uL (ref 3.87–5.11)
RDW: 16.7 % — AB (ref 11.5–15.5)
WBC: 5.2 10*3/uL (ref 4.0–10.5)

## 2017-01-14 LAB — I-STAT BETA HCG BLOOD, ED (MC, WL, AP ONLY): I-stat hCG, quantitative: <5 m[IU]/mL (ref <5)

## 2017-01-14 LAB — LIPASE, BLOOD: Lipase: 25 U/L (ref 11–51)

## 2017-01-14 MED ORDER — ONDANSETRON 8 MG PO TBDP: 8.0000 mg | ORAL_TABLET | Freq: Three times a day (TID) | ORAL | 0 refills | Status: DC | PRN

## 2017-01-14 MED ORDER — DICYCLOMINE HCL 10 MG/ML IM SOLN
20.0000 mg | Freq: Once | INTRAMUSCULAR | Status: AC
  Administered 2017-01-14: 20 mg via INTRAMUSCULAR
  Filled 2017-01-14: qty 2

## 2017-01-14 MED ORDER — ONDANSETRON 8 MG PO TBDP
8.0000 mg | ORAL_TABLET | Freq: Once | ORAL | Status: AC
  Administered 2017-01-14: 8 mg via ORAL
  Filled 2017-01-14: qty 1

## 2017-01-14 NOTE — ED Notes (Signed)
Pt reminded of the need for a urine sample.  

## 2017-01-14 NOTE — ED Triage Notes (Signed)
Per EMS pt complaint of severe mid abdominal pain onset 0300 this am with associated diarrhea, nausea without vomiting, and vaginal bleeding/discharge.

## 2017-01-14 NOTE — Discharge Instructions (Signed)
Take zofran as prescribed as needed for nausea. Tylenol or motrin for pain and cramping. Follow up with family doctor as needed. Return if worsening

## 2017-01-14 NOTE — ED Provider Notes (Signed)
[facility]-EMERGENCY DEPT Provider Note   CSN: [id number] Arrival date & time: 01/14/17  1117     History   Chief Complaint Chief Complaint  Patient presents with  . Abdominal Pain    HPI Andrea [name] is a 26 y.o. female.  HPI Andrea [name] is a 26 y.o. female with a history of anemia, diabetes, presents to emergency department complaining of abdominal pain, nausea, vomiting, diarrhea. Patient states she initially woke up around 3 AM with one episode of diarrhea and one episode of emesis. She also noted that she had a vaginal spotting but states it is not time for her menstrual cycle. She states she was able to go back to sleep and woke up again around 7 AM with similar symptoms and this time severe abdominal cramping. She states pain feels crampy, states it is all over. Since then persistent nausea and multiple episodes of emesis and diarrhea. Denies any urinary symptoms. Denies any fever or chills. No back pain. Patient states that her 90-year-old son just got over hand foot mouth disease. She denies any other recent sick contacts. She denies being pregnant. She did not take any medications prior to coming in.  Past Medical History:  Diagnosis Date  . Anemia    as a teenager  . Chicken pox   . Diabetes mellitus    type 2 on insulin  . Impacted teeth with abnormal position    impacted wisdom teeth    Patient Active Problem List   Diagnosis Date Noted  . Healthcare maintenance 06/01/2016  . Morbid obesity (HCC) 02/25/2016  . Diabetes mellitus type 2, uncontrolled, without complications (HCC) 10/31/2012    Past Surgical History:  Procedure Laterality Date  . CESAREAN SECTION N/A 09/16/2015   Procedure: CESAREAN SECTION;  Surgeon: Lesly Dukes, MD;  Location: [facility] BIRTHING SUITES;  Service: Obstetrics;  Laterality: N/A;  . FOREIGN BODY REMOVAL Left 08/28/2015   Procedure: REMOVAL FOREIGN BODY foot;  Surgeon: Dannielle Huh, MD;  Location: [facility] OR;  Service: Orthopedics;   Laterality: Left;  regional block  . NO PAST SURGERIES    . TOOTH EXTRACTION N/A 07/24/2016   Procedure: EXTRACTION WISDOM TEETH ONE, SIXTEEN, SEVENTEEN AND THIRTY-TWO;  Surgeon: Ocie Doyne, DDS;  Location: [facility] OR;  Service: Oral Surgery;  Laterality: N/A;    OB History    Gravida Para Term Preterm AB Living   1 1 0 1 0 1   SAB TAB Ectopic Multiple Live Births   0 0 0 0 1       Home Medications    Prior to Admission medications   Medication Sig Start Date End Date Taking? Authorizing Provider  insulin aspart (NOVOLOG FLEXPEN) 100 UNIT/ML FlexPen Inject 12 Units into the skin 3 (three) times daily with meals. Patient taking differently: Inject 13 Units into the skin 3 (three) times daily with meals.  08/03/16  Yes Arnetha Courser, MD  Insulin Glargine (LANTUS SOLOSTAR) 100 UNIT/ML Solostar Pen Inject 50 Units into the skin daily at 10 pm. Patient taking differently: Inject 60 Units into the skin daily at 10 pm.  08/03/16  Yes Arnetha Courser, MD  metFORMIN (GLUCOPHAGE-XR) 500 MG 24 hr tablet Take 1 tablet (500 mg total) by mouth daily with breakfast. IM program Patient not taking: Reported on 01/14/2017 11/06/16   Inez Catalina, MD  norgestimate-ethinyl estradiol (ORTHO-CYCLEN,SPRINTEC,PREVIFEM) 0.25-35 MG-MCG tablet Take 1 tablet by mouth daily. Only take active pills in a continuous fashion. Patient not taking: Reported on 01/14/2017 06/24/16  Anyanwu, Jethro Bastos, MD  ondansetron (ZOFRAN) 4 MG tablet Take 1 tablet (4 mg total) by mouth every 8 (eight) hours as needed for nausea or vomiting. Patient not taking: Reported on 01/14/2017 07/24/16   Ocie Doyne, DDS    Family History Family History  Problem Relation Age of Onset  . Stroke Father   . Hypertension Father   . Diabetes Mellitus II Father   . Irregular heart beat Father   . Heart disease Father   . Hyperlipidemia Father   . Diabetes Mellitus II Mother   . Hyperlipidemia Mother   . Thyroid disease Mother   . Hypertension  Maternal Aunt   . Thyroid disease Maternal Aunt   . Diabetes Mellitus II Paternal Grandmother   . Anesthesia problems Neg Hx     Social History Social History  Substance Use Topics  . Smoking status: Never Smoker  . Smokeless tobacco: Never Used  . Alcohol use Yes     Comment: rare     Allergies   Metformin and related and No known allergies   Review of Systems Review of Systems  Constitutional: Negative for chills and fever.  Respiratory: Negative for cough, chest tightness and shortness of breath.   Cardiovascular: Negative for chest pain, palpitations and leg swelling.  Gastrointestinal: Positive for abdominal pain, diarrhea, nausea and vomiting.  Genitourinary: Positive for vaginal bleeding. Negative for dysuria, flank pain, pelvic pain, vaginal discharge and vaginal pain.  Musculoskeletal: Negative for arthralgias, myalgias, neck pain and neck stiffness.  Skin: Negative for rash.  Neurological: Negative for dizziness, weakness and headaches.  All other systems reviewed and are negative.    Physical Exam Updated Vital Signs BP (!) 140/98 (BP Location: Left Arm)   Pulse 82   Temp 98.1 F (36.7 C) (Oral)   Resp 16   Ht  (1.575 m)   Wt 108.9 kg (240 lb)   LMP 01/05/2017   SpO2 93% Comment: Simultaneous filing. User may not have seen previous data.  BMI 43.90 kg/m   Physical Exam  Constitutional: She appears well-developed and well-nourished. No distress.  HENT:  Head: Normocephalic.  Eyes: Conjunctivae are normal.  Neck: Neck supple.  Cardiovascular: Normal rate, regular rhythm and normal heart sounds.   Pulmonary/Chest: Effort normal and breath sounds normal. No respiratory distress. She has no wheezes. She has no rales.  Abdominal: Soft. Bowel sounds are normal. She exhibits no distension. There is no tenderness. There is no rebound and no guarding.  Musculoskeletal: She exhibits no edema.  Neurological: She is alert.  Skin: Skin is warm and dry.    Psychiatric: She has a normal mood and affect. Her behavior is normal.  Nursing note and vitals reviewed.    ED Treatments / Results  Labs (all labs ordered are listed, but only abnormal results are displayed) Labs Reviewed  COMPREHENSIVE METABOLIC PANEL - Abnormal; Notable for the following:       Result Value   Calcium 8.8 (*)    All other components within normal limits  CBC - Abnormal; Notable for the following:    Hemoglobin 10.2 (*)    HCT 31.5 (*)    MCV 69.1 (*)    MCH 22.4 (*)    RDW 16.7 (*)    Platelets 441 (*)    All other components within normal limits  LIPASE, BLOOD  I-STAT BETA HCG BLOOD, ED ([facility], [facility], [facility] ONLY)    EKG  EKG Interpretation None       Radiology  Dg Abd Portable 2 Views  Result Date: 01/14/2017 CLINICAL DATA:  Nausea, vomiting and abdominal pain over the last 10 hours. EXAM: PORTABLE ABDOMEN - 2 VIEW COMPARISON:  11/18/2015 FINDINGS: Bowel gas pattern is normal without evidence of ileus, obstruction or free air. No abnormal calcifications or bony findings. IMPRESSION: Normal radiographs. Electronically Signed   By: Paulina Fusi M.D.   On: 01/14/2017 13:31    Procedures Procedures (including critical care time)  Medications Ordered in ED Medications  ondansetron (ZOFRAN-ODT) disintegrating tablet 8 mg (8 mg Oral Given 01/14/17 1342)  dicyclomine (BENTYL) injection 20 mg (20 mg Intramuscular Given 01/14/17 1342)     Initial Impression / Assessment and Plan / ED Course  I have reviewed the triage vital signs and the nursing notes.  Pertinent labs & imaging results that were available during my care of the patient were reviewed by me and considered in my medical decision making (see chart for details).     Patient with nausea, vomiting, diarrhea, abdominal cramping onset early this morning. Abdomen is nontender. Vital signs are normal. Patient is in no acute distress. Labs obtained by triage nurse are unremarkable with normal white blood  cell count, normal electrolytes. Slightly low calcium at 8.8 and slight low hemoglobin at 10.2. She is not pregnant. We'll give Zofran for nausea, Bentyl for cramping, will check abd film and UA.   abd film and ua negative. Pt feels much better. Drinking fluids. Laughing. Abdomen soft, non tender. Most likely viral URI. Will dc home with symptomatic tx. Give rx for zofran. Return precautions discussed.   Vitals:   01/14/17 1124 01/14/17 1436  BP: (!) 140/98 (!) 136/96  Pulse: 82 70  Resp: 16 17  Temp: 98.1 F (36.7 C)   TempSrc: Oral   SpO2: 93% 99%  Weight: 108.9 kg (240 lb)   Height:  (1.575 m)       Final Clinical Impressions(s) / ED Diagnoses   Final diagnoses:  Nausea vomiting and diarrhea    New Prescriptions Discharge Medication List as of 01/14/2017  1:52 PM    START taking these medications   Details  ondansetron (ZOFRAN ODT) 8 MG disintegrating tablet Take 1 tablet (8 mg total) by mouth every 8 (eight) hours as needed for nausea or vomiting., Starting Thu 01/14/2017, Print         Linzy Darling, [place], PA-C 01/14/17 1553    Rolan Bucco, MD 01/14/17 [phone]

## 2017-01-22 ENCOUNTER — Encounter: Payer: Self-pay | Admitting: Internal Medicine

## 2017-04-17 ENCOUNTER — Emergency Department (HOSPITAL_COMMUNITY): Payer: Self-pay

## 2017-04-17 ENCOUNTER — Encounter (HOSPITAL_COMMUNITY): Payer: Self-pay | Admitting: Emergency Medicine

## 2017-04-17 ENCOUNTER — Emergency Department (HOSPITAL_COMMUNITY)
Admission: EM | Admit: 2017-04-17 | Discharge: 2017-04-17 | Disposition: A | Discharge status: Home / Self Care | Payer: Self-pay | Attending: Emergency Medicine | Admitting: Emergency Medicine

## 2017-04-17 DIAGNOSIS — Z794 Long term (current) use of insulin: Secondary | ICD-10-CM | POA: Insufficient documentation

## 2017-04-17 DIAGNOSIS — E119 Type 2 diabetes mellitus without complications: Secondary | ICD-10-CM | POA: Insufficient documentation

## 2017-04-17 DIAGNOSIS — Z79899 Other long term (current) drug therapy: Secondary | ICD-10-CM | POA: Insufficient documentation

## 2017-04-17 DIAGNOSIS — N63 Unspecified lump in unspecified breast: Secondary | ICD-10-CM

## 2017-04-17 DIAGNOSIS — N6310 Unspecified lump in the right breast, unspecified quadrant: Secondary | ICD-10-CM | POA: Insufficient documentation

## 2017-04-17 DIAGNOSIS — R05 Cough: Secondary | ICD-10-CM | POA: Insufficient documentation

## 2017-04-17 DIAGNOSIS — R059 Cough, unspecified: Secondary | ICD-10-CM

## 2017-04-17 NOTE — Discharge Instructions (Signed)
It was my pleasure taking care of you today!   It is very important that you get imaging of your breast. Call the breast center to try to arrange this. If you cannot get an appointment with them, please call the women's clinic listed to schedule an appointment.   Continue Mucinex for upper respiratory infection.   Return to ER for new or worsening symptoms, any additional concerns.

## 2017-04-17 NOTE — ED Triage Notes (Signed)
Patient reports that on Wed she noticed lump on right breast that is large. Also reports when she wakes up in mornings she has congestion chest and coughs up yellow sputum.

## 2017-04-17 NOTE — ED Provider Notes (Signed)
[facility]-EMERGENCY DEPT Provider Note   CSN: [id number] Arrival date & time: 04/17/17  0947     History   Chief Complaint Chief Complaint  Patient presents with  . Breast Mass    right    HPI Andrea [name] is a 27 y.o. female.  The history is provided by the patient and medical records. No language interpreter was used.   Andrea [name] is a 27 y.o. female  with a PMH of DM2 who presents to the Emergency Department with two complaints:  1.  Lump to the right breast which she noticed 5 days ago.  This is not painful.  There is no drainage, erythema or wound.  She has not taken any medications for this.  She noted the nodule on a routine breast exam performed by herself.  She has no personal or family history of breast cancer.  She just got off her cycle yesterday which was as usual.  She has never had any history of similar symptoms in the past.    2.  Cough and congestion times 5-6 days.  Cough is productive of yellow sputum intermittently, but mostly dry.  No fever or chills.  She has taken Mucinex which has been improving her symptoms. No trouble breathing, chest pain, abdominal pain, nausea, vomiting or back pain.    Past Medical History:  Diagnosis Date  . Anemia    as a teenager  . Chicken pox   . Diabetes mellitus    type 2 on insulin  . Impacted teeth with abnormal position    impacted wisdom teeth    Patient Active Problem List   Diagnosis Date Noted  . Healthcare maintenance 06/01/2016  . Morbid obesity (HCC) 02/25/2016  . Diabetes mellitus type 2, uncontrolled, without complications (HCC) 10/31/2012    Past Surgical History:  Procedure Laterality Date  . CESAREAN SECTION N/A 09/16/2015   Procedure: CESAREAN SECTION;  Surgeon: Lesly Dukes, MD;  Location: [facility] BIRTHING SUITES;  Service: Obstetrics;  Laterality: N/A;  . FOREIGN BODY REMOVAL Left 08/28/2015   Procedure: REMOVAL FOREIGN BODY foot;  Surgeon: Dannielle Huh, MD;  Location:  [facility] OR;  Service: Orthopedics;  Laterality: Left;  regional block  . NO PAST SURGERIES    . TOOTH EXTRACTION N/A 07/24/2016   Procedure: EXTRACTION WISDOM TEETH ONE, SIXTEEN, SEVENTEEN AND THIRTY-TWO;  Surgeon: Ocie Doyne, DDS;  Location: [facility] OR;  Service: Oral Surgery;  Laterality: N/A;    OB History    Gravida Para Term Preterm AB Living   1 1 0 1 0 1   SAB TAB Ectopic Multiple Live Births   0 0 0 0 1       Home Medications    Prior to Admission medications   Medication Sig Start Date End Date Taking? Authorizing Provider  insulin aspart (NOVOLOG FLEXPEN) 100 UNIT/ML FlexPen Inject 12 Units into the skin 3 (three) times daily with meals. Patient taking differently: Inject 13 Units into the skin 3 (three) times daily with meals.  08/03/16   Arnetha Courser, MD  Insulin Glargine (LANTUS SOLOSTAR) 100 UNIT/ML Solostar Pen Inject 50 Units into the skin daily at 10 pm. Patient taking differently: Inject 60 Units into the skin daily at 10 pm.  08/03/16   Arnetha Courser, MD  metFORMIN (GLUCOPHAGE-XR) 500 MG 24 hr tablet Take 1 tablet (500 mg total) by mouth daily with breakfast. IM program Patient not taking: Reported on 01/14/2017 11/06/16   Inez Catalina, MD  norgestimate-ethinyl estradiol (ORTHO-CYCLEN,SPRINTEC,PREVIFEM) 0.25-35 MG-MCG tablet Take 1 tablet by mouth daily. Only take active pills in a continuous fashion. Patient not taking: Reported on 01/14/2017 06/24/16   Anyanwu, Jethro Bastos, MD  ondansetron (ZOFRAN ODT) 8 MG disintegrating tablet Take 1 tablet (8 mg total) by mouth every 8 (eight) hours as needed for nausea or vomiting. 01/14/17   Kirichenko, Tatyana, PA-C  ondansetron (ZOFRAN) 4 MG tablet Take 1 tablet (4 mg total) by mouth every 8 (eight) hours as needed for nausea or vomiting. Patient not taking: Reported on 01/14/2017 07/24/16   Ocie Doyne, DDS    Family History Family History  Problem Relation Age of Onset  . Stroke Father   . Hypertension Father   . Diabetes Mellitus  II Father   . Irregular heart beat Father   . Heart disease Father   . Hyperlipidemia Father   . Diabetes Mellitus II Mother   . Hyperlipidemia Mother   . Thyroid disease Mother   . Hypertension Maternal Aunt   . Thyroid disease Maternal Aunt   . Diabetes Mellitus II Paternal Grandmother   . Anesthesia problems Neg Hx     Social History Social History   Tobacco Use  . Smoking status: Never Smoker  . Smokeless tobacco: Never Used  Substance Use Topics  . Alcohol use: Yes    Comment: rare  . Drug use: No     Allergies   Metformin and related and No known allergies   Review of Systems Review of Systems  HENT: Positive for congestion. Negative for ear pain, sore throat and trouble swallowing.   Respiratory: Positive for cough. Negative for shortness of breath.   Skin: Positive for wound (Breast nodule).  All other systems reviewed and are negative.    Physical Exam Updated Vital Signs BP (!) 144/84 (BP Location: Left Arm)   Pulse 71   Temp 98 F (36.7 C) (Oral)   Resp 18   LMP 04/13/2017   SpO2 100%   Physical Exam  Constitutional: She is oriented to person, place, and time. She appears well-developed and well-nourished. No distress.  HENT:  Head: Normocephalic and atraumatic.  OP with erythema, no exudates or tonsillar hypertrophy. + nasal congestion with mucosal edema.   Neck: Normal range of motion. Neck supple.  Cardiovascular: Normal rate, regular rhythm and normal heart sounds.  Pulmonary/Chest: Effort normal.  Lungs are clear to auscultation bilaterally - no w/r/r. 2 firm nontender nodules to the right breast at 10:00 with no overlying skin changes.  No nipple deviation or discharge.  Abdominal: Soft. She exhibits no distension. There is no tenderness.  Musculoskeletal: Normal range of motion.  Neurological: She is alert and oriented to person, place, and time.  Skin: Skin is warm and dry. She is not diaphoretic.  Nursing note and vitals  reviewed.    ED Treatments / Results  Labs (all labs ordered are listed, but only abnormal results are displayed) Labs Reviewed - No data to display  EKG  EKG Interpretation None       Radiology Dg Chest 2 View  Result Date: 04/17/2017 CLINICAL DATA:  Cough, congestion, right breast lump EXAM: CHEST  2 VIEW COMPARISON:  08/10/2012 FINDINGS: Lungs are clear.  No pleural effusion or pneumothorax. The heart is normal in size. Visualized osseous structures are within normal limits. IMPRESSION: Normal chest radiographs. Electronically Signed   By: Charline Bills M.D.   On: 04/17/2017 16:53    Procedures Procedures (including critical care time)  Medications  Ordered in ED Medications - No data to display   Initial Impression / Assessment and Plan / ED Course  I have reviewed the triage vital signs and the nursing notes.  Pertinent labs & imaging results that were available during my care of the patient were reviewed by me and considered in my medical decision making (see chart for details).    Lavenia AtlasOlivia J Gildersleeve is a 27 y.o. female who presents to ED for two complaints:  1. URI symptoms. CXR negative. Lungs clear. Well appearing. Likely viral. Continue Mucinex and supportive care. PCP follow up if no improvement.   2. Two non-tender firm breast nodules which she noticed earlier this week. No signs of infection on exam. Will need ultrasound or mammogram. Referred to breast center. Given OBGYN clinic follow up as well if she cannot get in with breast center.   Return precautions discussed. All questions answered.    Final Clinical Impressions(s) / ED Diagnoses   Final diagnoses:  Cough  Breast nodule    ED Discharge Orders    None       Suan Pyeatt, Chase PicketJaime Pilcher, PA-C 04/17/17 1753    Charlynne PanderYao, David Hsienta, MD 04/17/17 2255

## 2017-04-26 ENCOUNTER — Ambulatory Visit (INDEPENDENT_AMBULATORY_CARE_PROVIDER_SITE_OTHER): Payer: Self-pay | Admitting: Advanced Practice Midwife

## 2017-04-26 ENCOUNTER — Encounter: Payer: Self-pay | Admitting: Advanced Practice Midwife

## 2017-04-26 VITALS — Wt 255.0 lb

## 2017-04-26 DIAGNOSIS — N63 Unspecified lump in unspecified breast: Secondary | ICD-10-CM

## 2017-04-26 NOTE — Progress Notes (Signed)
Noticed lump R breast about 3wks ago. [facility]. Sent here for further eval. Pt unsure if she has had mammogram in past

## 2017-04-26 NOTE — Progress Notes (Signed)
Subjective:     Patient ID: Andrea [name], female   DOB: Jul 31, 1990, 27 y.o.   MRN: [id number]  Andrea [name] is a 27 y.o. G1P0101 who presents today for evaluation of breast lump. She has noticed a lump in her right breast x 2-3 weeks. She was seen in the ED for this on 04/17/17. She was referred here for further FU. She denies any personal or family history of breast cancer. She denies any pain. She reports that it feels like a rock. She reports that it has not changed in shape or consistency since she first felt it.      Review of Systems  Constitutional: Negative for chills and fever.  Gastrointestinal: Negative for nausea and vomiting.  Genitourinary: Negative for decreased urine volume, dysuria, pelvic pain, vaginal bleeding and vaginal discharge.       Objective:   Physical Exam  Constitutional: She is oriented to person, place, and time. She appears well-developed and well-nourished. No distress.  HENT:  Head: Normocephalic.  Cardiovascular: Normal rate.  Pulmonary/Chest: Effort normal. Right breast exhibits mass. Right breast exhibits no inverted nipple, no nipple discharge, no skin change and no tenderness. Left breast exhibits mass. Left breast exhibits no inverted nipple, no nipple discharge, no skin change and no tenderness.    Abdominal: Soft. There is no tenderness.  Neurological: She is alert and oriented to person, place, and time.  Skin: Skin is warm and dry.  Nursing note and vitals reviewed.  Bilateral breast masses. DW patient that these are likely benign, but since they are newly developed [facility] as she does not have insurance.     Assessment:     1. Lump or mass in breast        Plan:     [facility] They will set her up for an US/mammogram PRN to fully evaluate these masses  Thressa ShellerHeather Hogan 4:37 PM 04/26/17

## 2017-04-26 NOTE — Progress Notes (Signed)
Emailed [facility] referral.

## 2017-04-28 ENCOUNTER — Other Ambulatory Visit (HOSPITAL_COMMUNITY): Payer: Self-pay | Admitting: *Deleted

## 2017-04-28 DIAGNOSIS — N63 Unspecified lump in unspecified breast: Secondary | ICD-10-CM

## 2017-05-13 ENCOUNTER — Ambulatory Visit
Admission: RE | Admit: 2017-05-13 | Discharge: 2017-05-13 | Disposition: A | Discharge status: Home / Self Care | Payer: No Typology Code available for payment source | Source: Ambulatory Visit | Attending: Obstetrics and Gynecology | Admitting: Obstetrics and Gynecology

## 2017-05-13 ENCOUNTER — Encounter (HOSPITAL_COMMUNITY): Payer: Self-pay

## 2017-05-13 ENCOUNTER — Ambulatory Visit (HOSPITAL_COMMUNITY)
Admission: RE | Admit: 2017-05-13 | Discharge: 2017-05-13 | Disposition: A | Discharge status: Home / Self Care | Payer: Self-pay | Source: Ambulatory Visit | Attending: Obstetrics and Gynecology | Admitting: Obstetrics and Gynecology

## 2017-05-13 VITALS — BP 148/90 | Ht 62.0 in | Wt 253.4 lb

## 2017-05-13 DIAGNOSIS — N6321 Unspecified lump in the left breast, upper outer quadrant: Secondary | ICD-10-CM

## 2017-05-13 DIAGNOSIS — N63 Unspecified lump in unspecified breast: Secondary | ICD-10-CM

## 2017-05-13 DIAGNOSIS — N6311 Unspecified lump in the right breast, upper outer quadrant: Secondary | ICD-10-CM

## 2017-05-13 DIAGNOSIS — Z1239 Encounter for other screening for malignant neoplasm of breast: Secondary | ICD-10-CM

## 2017-05-13 NOTE — Progress Notes (Signed)
Complaints of bilateral breast lumps x 1 month.  Pap Smear: Pap smear not completed today. [facility] and normal. Per patient has no history of an abnormal Pap smear. Last Pap smear result is in Epic.  Physical exam: Breasts Breasts symmetrical. No skin abnormalities bilateral breasts. No nipple retraction bilateral breasts. No nipple discharge bilateral breasts. No lymphadenopathy. Palpated a lump within the right breast at 10 o'clock 8 cm from the nipple. Palpated a lump within the left breast at 1 o'clock 4 cm from the nipple. No complaints of pain or tenderness on exam. [facility]. Appointment scheduled for Thursday, May 13, 2017 at 1530.        Pelvic/Bimanual No Pap smear completed today since last Pap smear was 04/15/2015. Pap smear not indicated per [facility] guidelines.   Smoking History: Patient has never smoked.  Patient Navigation: Patient education provided. Access to services provided for [facility] through [facility].

## 2017-05-13 NOTE — Patient Instructions (Signed)
Explained breast self awareness with Andrea [name]. Patient did not need a Pap smear today due to last Pap smear was 04/15/2015. Let her know [facility] will cover Pap smears every 3 years unless has a history of abnormal Pap smears. [facility]. Appointment scheduled for Thursday, May 13, 2017 at 1530. Andrea [name] verbalized understanding.  Modelle Vollmer, Kathaleen Maserhristine Poll, RN 4:30 PM

## 2017-05-19 ENCOUNTER — Inpatient Hospital Stay: Payer: Self-pay | Admitting: Family Medicine

## 2017-06-29 ENCOUNTER — Encounter: Payer: Self-pay | Admitting: Family Medicine

## 2017-06-29 ENCOUNTER — Ambulatory Visit: Payer: Self-pay

## 2017-06-29 DIAGNOSIS — Z3202 Encounter for pregnancy test, result negative: Secondary | ICD-10-CM

## 2017-06-29 DIAGNOSIS — Z3201 Encounter for pregnancy test, result positive: Secondary | ICD-10-CM

## 2017-06-29 LAB — POCT PREGNANCY, URINE: PREG TEST UR: POSITIVE — AB

## 2017-06-29 NOTE — Progress Notes (Signed)
Pt here today of pregnancy test.  Resulted positive.  Pt reports LMP 05/08/17.  EDD 02/12/18.  Pt given sheet of medications safe to take in pregnancy.  Pt denies any sx.  Proof of pregnancy letter provider to start prenatal care.

## 2017-07-23 ENCOUNTER — Ambulatory Visit: Payer: Medicaid Other | Attending: Family Medicine | Admitting: Family Medicine

## 2017-07-23 ENCOUNTER — Encounter: Payer: Self-pay | Admitting: Family Medicine

## 2017-07-23 VITALS — BP 119/77 | HR 84 | Temp 98.0°F | Ht 62.0 in | Wt 252.0 lb

## 2017-07-23 DIAGNOSIS — E119 Type 2 diabetes mellitus without complications: Secondary | ICD-10-CM | POA: Insufficient documentation

## 2017-07-23 DIAGNOSIS — Z3490 Encounter for supervision of normal pregnancy, unspecified, unspecified trimester: Secondary | ICD-10-CM

## 2017-07-23 DIAGNOSIS — Z794 Long term (current) use of insulin: Secondary | ICD-10-CM | POA: Insufficient documentation

## 2017-07-23 DIAGNOSIS — E1165 Type 2 diabetes mellitus with hyperglycemia: Secondary | ICD-10-CM

## 2017-07-23 DIAGNOSIS — IMO0001 Reserved for inherently not codable concepts without codable children: Secondary | ICD-10-CM

## 2017-07-23 LAB — GLUCOSE, POCT (MANUAL RESULT ENTRY): POC GLUCOSE: 108 mg/dL — AB (ref 70–99)

## 2017-07-23 LAB — POCT GLYCOSYLATED HEMOGLOBIN (HGB A1C): Hemoglobin A1C: 6.3

## 2017-07-23 MED ORDER — INSULIN GLARGINE 100 UNIT/ML SOLOSTAR PEN: 55.0000 [IU] | PEN_INJECTOR | Freq: Every day | SUBCUTANEOUS | 0 refills | Status: DC

## 2017-07-23 NOTE — Progress Notes (Signed)
Subjective:  Patient ID: Andrea [name], female    DOB: May 05, 1990  Age: 27 y.o. MRN: [id number]  CC: Diabetes   HPI Andrea [name] is a 27 year old female with a history of type 2 diabetes mellitus (A1c 6.3 who was last seen in the clinic in 02/2016 who presents today for follow-up visit. She had a baby shortly after her last visit and diabetes was managed by her obstetrician and she recently ran out of Lantus but then informs me again she is pregnant with an [facility] week Tuesday. Her LMP was 05/08/17 and she endorses compliance with 60 units of Lantus but has noticed that her fasting sugars have been in the 100s and she is not needing NovoLog. She endorses the presence of seasonal allergy symptoms like postnasal drip, rhinorrhea and sneezing.  Past Medical History:  Diagnosis Date  . Anemia    as a teenager  . Chicken pox   . Diabetes mellitus    type 2 on insulin  . Impacted teeth with abnormal position    impacted wisdom teeth    Past Surgical History:  Procedure Laterality Date  . CESAREAN SECTION N/A 09/16/2015   Procedure: CESAREAN SECTION;  Surgeon: Lesly DukesKelly H Leggett, MD;  [facility];  Service: Obstetrics;  Laterality: N/A;  . FOREIGN BODY REMOVAL Left 08/28/2015   Procedure: REMOVAL FOREIGN BODY foot;  Surgeon: Dannielle HuhSteve Lucey, MD;  Location: [facility] OR;  Service: Orthopedics;  Laterality: Left;  regional block  . NO PAST SURGERIES    . TOOTH EXTRACTION N/A 07/24/2016   Procedure: EXTRACTION WISDOM TEETH ONE, SIXTEEN, SEVENTEEN AND THIRTY-TWO;  Surgeon: Ocie DoyneScott Jensen, DDS;  Location: [facility] OR;  Service: Oral Surgery;  Laterality: N/A;    Allergies  Allergen Reactions  . Metformin And Related Diarrhea  . No Known Allergies      Outpatient Medications Prior to Visit  Medication Sig Dispense Refill  . insulin aspart (NOVOLOG FLEXPEN) 100 UNIT/ML FlexPen Inject 12 Units into the skin 3 (three) times daily with meals. 15 mL  11  . Prenatal Vit-Fe Fumarate-FA (PREPLUS) 27-1 MG TABS Take 1 tablet by mouth daily.    . Insulin Glargine (LANTUS SOLOSTAR) 100 UNIT/ML Solostar Pen Inject 50 Units into the skin daily at 10 pm. (Patient taking differently: Inject 60 Units into the skin daily at 10 pm. ) 5 pen 3   No facility-administered medications prior to visit.     ROS Review of Systems  Constitutional: Negative for activity change, appetite change and fatigue.  HENT: Positive for rhinorrhea and sneezing. Negative for congestion, sinus pressure and sore throat.   Eyes: Negative for visual disturbance.  Respiratory: Negative for cough, chest tightness, shortness of breath and wheezing.   Cardiovascular: Negative for chest pain and palpitations.  Gastrointestinal: Negative for abdominal distention, abdominal pain and constipation.  Endocrine: Negative for polydipsia.  Genitourinary: Negative for dysuria and frequency.  Musculoskeletal: Negative for arthralgias and back pain.  Skin: Negative for rash.  Neurological: Negative for tremors, light-headedness and numbness.  Hematological: Does not bruise/bleed easily.  Psychiatric/Behavioral: Negative for agitation and behavioral problems.    Objective:  BP 119/77   Pulse 84   Temp 98 F (36.7 C) (Oral)   Ht 5\' 2"  (1.575 m)   Wt 252 lb (114.3 kg)   SpO2 97%   BMI 46.09 kg/m   BP/Weight 07/23/2017 05/13/2017 04/26/2017  Systolic BP 119 148 -  Diastolic BP 77 90 -  Wt. (  Lbs) 252 253.4 255  BMI 46.09 46.35 46.64      Physical Exam  Constitutional: She is oriented to person, place, and time. She appears well-developed and well-nourished.  Cardiovascular: Normal rate, normal heart sounds and intact distal pulses.  No murmur heard. Pulmonary/Chest: Effort normal and breath sounds normal. She has no wheezes. She has no rales. She exhibits no tenderness.  Abdominal: Soft. Bowel sounds are normal. She exhibits no distension and no mass. There is no tenderness.    Musculoskeletal: Normal range of motion.  Neurological: She is alert and oriented to person, place, and time.  Skin: Skin is warm and dry.     Assessment & Plan:   1. Diabetes mellitus type 2, uncontrolled, without complications (HCC) Controlled with A1c of 6.3 Reduce Lantus to 55 units to prevent hypoglycemia Hold off on NovoLog Diabetes will be managed by obstetrician - POCT glucose (manual entry) - POCT glycosylated hemoglobin (Hb A1C) - Insulin Glargine (LANTUS SOLOSTAR) 100 UNIT/ML Solostar Pen; Inject 55 Units into the skin daily at 10 pm.  Dispense: 5 pen; Refill: 0  2. Pregnancy, unspecified gestational age Advised to keep upcoming appointment with OB/GYN   Meds ordered this encounter  Medications  . Insulin Glargine (LANTUS SOLOSTAR) 100 UNIT/ML Solostar Pen    Sig: Inject 55 Units into the skin daily at 10 pm.    Dispense:  5 pen    Refill:  0    Follow-up: Return for pre natal care, follow-up with GYN.   Hoy Register MD

## 2017-07-23 NOTE — Patient Instructions (Signed)
Tips for Eating Away From Home If You Have Diabetes Controlling your level of blood glucose, also known as blood sugar, can be challenging. It can be even more difficult when you do not prepare your own meals. The following tips can help you manage your diabetes when you eat away from home. Planning ahead Plan ahead if you know you will be eating away from home:  Ask your health care provider how to time meals and medicine if you are taking insulin.  Make a list of restaurants near you that offer healthy choices. If they have a carry-out menu, take it home and plan what you will order ahead of time.  Look up the restaurant you want to eat at online. Many chain and fast-food restaurants list nutritional information online. Use this information to choose the healthiest options and to calculate how many carbohydrates will be in your meal.  Use a carbohydrate-counting book or mobile app to look up the carbohydrate content and serving size of the foods you want to eat.  Become familiar with serving sizes and learn to recognize how many servings are in a portion. This will allow you to estimate how many carbohydrates you can eat.  Free foods A "free food" is any food or drink that has less than 5 g of carbohydrates per serving. Free foods include:  Many vegetables.  Hard boiled eggs.  Nuts or seeds.  Olives.  Cheeses.  Meats.  These types of foods make good appetizer choices and are often available at salad bars. Lemon juice, vinegar, or a low-calorie salad dressing of fewer than 20 calories per serving can be used as a "free" salad dressing. Choices to reduce carbohydrates  Substitute nonfat sweetened yogurt with a sugar-free yogurt. Yogurt made from soy milk may also be used, but you will still want a sugar-free or plain option to choose a lower carbohydrate amount.  Ask your server to take away the bread basket or chips from your table.  Order fresh fruit. A salad bar often offers  fresh fruit choices. Avoid canned fruit because it is usually packed in sugar or syrup.  Order a salad, and eat it without dressing. Or, create a "free" salad dressing.  Ask for substitutions. For example, instead of French fries, request an order of a vegetable such as salad, green beans, or broccoli. Other tips  If you take insulin, take the insulin once your food arrives to your table. This will ensure your insulin and food are timed correctly.  Ask your server about the portion size before your order, and ask for a take-out box if the portion has more servings than you should have. When your food comes, leave the amount you should have on the plate, and put the rest in the take-out box.  Consider splitting an entree with someone and ordering a side salad. This information is not intended to replace advice given to you by your health care provider. Make sure you discuss any questions you have with your health care provider. Document Released: 03/30/2005 Document Revised: 09/05/2015 Document Reviewed: 06/27/2013 Elsevier Interactive Patient Education  2018 Elsevier Inc.  

## 2017-07-26 ENCOUNTER — Encounter: Payer: Self-pay | Admitting: Advanced Practice Midwife

## 2017-07-26 ENCOUNTER — Ambulatory Visit (INDEPENDENT_AMBULATORY_CARE_PROVIDER_SITE_OTHER): Payer: Medicaid Other | Admitting: Advanced Practice Midwife

## 2017-07-26 ENCOUNTER — Ambulatory Visit: Payer: Self-pay

## 2017-07-26 VITALS — BP 126/63 | HR 75 | Wt 251.7 lb

## 2017-07-26 DIAGNOSIS — O34219 Maternal care for unspecified type scar from previous cesarean delivery: Secondary | ICD-10-CM

## 2017-07-26 DIAGNOSIS — O09219 Supervision of pregnancy with history of pre-term labor, unspecified trimester: Secondary | ICD-10-CM

## 2017-07-26 DIAGNOSIS — O3680X0 Pregnancy with inconclusive fetal viability, not applicable or unspecified: Secondary | ICD-10-CM

## 2017-07-26 DIAGNOSIS — E1165 Type 2 diabetes mellitus with hyperglycemia: Secondary | ICD-10-CM

## 2017-07-26 DIAGNOSIS — O09899 Supervision of other high risk pregnancies, unspecified trimester: Secondary | ICD-10-CM | POA: Insufficient documentation

## 2017-07-26 DIAGNOSIS — O09891 Supervision of other high risk pregnancies, first trimester: Secondary | ICD-10-CM

## 2017-07-26 DIAGNOSIS — IMO0001 Reserved for inherently not codable concepts without codable children: Secondary | ICD-10-CM

## 2017-07-26 LAB — POCT URINALYSIS DIP (DEVICE)
Bilirubin Urine: NEGATIVE
Glucose, UA: NEGATIVE mg/dL
HGB URINE DIPSTICK: NEGATIVE
Ketones, ur: NEGATIVE mg/dL
Leukocytes, UA: NEGATIVE
NITRITE: NEGATIVE
PH: 7.5 (ref 5.0–8.0)
PROTEIN: NEGATIVE mg/dL
Specific Gravity, Urine: 1.015 (ref 1.005–1.030)
UROBILINOGEN UA: 0.2 mg/dL (ref 0.0–1.0)

## 2017-07-26 MED ORDER — ASPIRIN EC 81 MG PO TBEC: 81.0000 mg | DELAYED_RELEASE_TABLET | Freq: Every day | ORAL | 11 refills | Status: DC

## 2017-07-26 NOTE — Patient Instructions (Signed)
Childbirth Education Options: [facility] Department Classes:  Childbirth education classes can help you get ready for a positive parenting experience. You can also meet other expectant parents and get free stuff for your baby. Each class runs for five weeks on the same night and costs $45 for the mother-to-be and her support person. Medicaid covers the cost if you are eligible. Call [phone] to register. [facility] Childbirth Education:  [phone] or [phone] or [email or web address]  Baby & Me Class: Discuss newborn & infant parenting and family adjustment issues with other new mothers in a relaxed environment. Each week brings a new speaker or baby-centered activity. We encourage new mothers to join [place] every Thursday at 11:00am. Babies birth until crawling. No registration or fee. Daddy [employer]: This course offers Dads-to-be the tools and knowledge needed to feel confident on their journey to becoming new fathers. Experienced dads, who have been trained as coaches, teach dads-to-be how to hold, comfort, diaper, swaddle and play with their infant while being able to support the new mom as well. A class for men taught by men. $25/dad Big Brother/Big Sister: Let your children share in the joy of a new brother or sister in this special class designed just for them. Class includes discussion about how families care for babies: swaddling, holding, diapering, safety as well as how they can be helpful in their new role. This class is designed for children ages 45 to 48, but any age is welcome. Please register each child individually. $5/child  Mom Talk: This mom-led group offers support and connection to mothers as they journey through the adjustments and struggles of that sometimes overwhelming first year after the birth of a child. Tuesdays at 10:00am and Thursdays at 6:00pm. Babies welcome. No registration or fee. Breastfeeding Support Group: This group is a mother-to-mother  support circle where moms have the opportunity to share their breastfeeding experiences. A Lactation Consultant is present for questions and concerns. Meets each Tuesday at 11:00am. No fee or registration. Breastfeeding Your Baby: Learn what to expect in the first days of breastfeeding your newborn.  This class will help you feel more confident with the skills needed to begin your breastfeeding experience. Many new mothers are concerned about breastfeeding after leaving the hospital. This class will also address the most common fears and challenges about breastfeeding during the first few weeks, months and beyond. (call for fee) Comfort Techniques and Tour: This 2 hour interactive class will provide you the opportunity to learn & practice hands-on techniques that can help relieve some of the discomfort of labor and encourage your baby to rotate toward the best position for birth. You and your partner will be able to try a variety of labor positions with birth balls and rebozos as well as practice breathing, relaxation, and visualization techniques. A tour of the [facility] is included with this class. $20 per registrant and support person Childbirth Class- Weekend Option: This class is a Weekend version of our Birth & Baby series. It is designed for parents who have a difficult time fitting several weeks of classes into their schedule. It covers the care of your newborn and the basics of labor and childbirth. It also includes a [place] [facility] and lunch. The class is held two consecutive days: beginning on Friday evening from 6:30 - 8:30 p.m. and the next day, Saturday from 9 a.m. - 4 p.m. (call for fee) Doren Custard Class: Interested in a waterbirth?  This  informational class will help you discover whether waterbirth is the right fit for you. Education about waterbirth itself, supplies you would need and how to assemble your support team is what you can  expect from this class. Some obstetrical practices require this class in order to pursue a waterbirth. (Not all obstetrical practices offer waterbirth-check with your healthcare provider.) Register only the expectant mom, but you are encouraged to bring your partner to class! Required if planning waterbirth, no fee. Infant/Child CPR: Parents, grandparents, babysitters, and friends learn Cardio-Pulmonary Resuscitation skills for infants and children. You will also learn how to treat both conscious and unconscious choking in infants and children. This Family & Friends program does not offer certification. Register each participant individually to ensure that enough mannequins are available. (Call for fee) Grandparent Love: Expecting a grandbaby? This class is for you! Learn about the latest infant care and safety recommendations and ways to support your own child as he or she transitions into the parenting role. Taught by Registered Nurses who are childbirth instructors, but most importantly...they are grandmothers too! $10/person. Childbirth Class- Natural Childbirth: This series of 5 weekly classes is for expectant parents who want to learn and practice natural methods of coping with the process of labor and childbirth. Relaxation, breathing, massage, visualization, role of the partner, and helpful positioning are highlighted. Participants learn how to be confident in their body's ability to give birth. This class will empower and help parents make informed decisions about their own care. Includes discussion that will help new parents transition into the immediate postpartum period. Maternity Care Center Tour of [facility] is included. We suggest taking this class between 25-32 weeks, but it's only a recommendation. $75 per registrant and one support person or $30 Medicaid. Childbirth Class- 3 week Series: This option of 3 weekly classes helps you and your labor partner prepare for childbirth. Newborn  care, labor & birth, cesarean birth, pain management, and comfort techniques are discussed and a Maternity Care Center Tour of [facility] is included. The class meets at the same time, on the same day of the week for 3 consecutive weeks beginning with the starting date you choose. $60 for registrant and one support person.  Marvelous Multiples: Expecting twins, triplets, or more? This class covers the differences in labor, birth, parenting, and breastfeeding issues that face multiples' parents. NICU tour is included. Led by a Certified Childbirth Educator who is the mother of twins. No fee. Caring for Baby: This class is for expectant and adoptive parents who want to learn and practice the most up-to-date newborn care for their babies. Focus is on birth through the first six weeks of life. Topics include feeding, bathing, diapering, crying, umbilical cord care, circumcision care and safe sleep. Parents learn to recognize symptoms of illness and when to call the pediatrician. Register only the mom-to-be and your partner or support person can plan to come with you! $10 per registrant and support person Childbirth Class- online option: This online class offers you the freedom to complete a Birth and Baby series in the comfort of your own home. The flexibility of this option allows you to review sections at your own pace, at times convenient to you and your support people. It includes additional video information, animations, quizzes, and extended activities. Get organized with helpful eClass tools, checklists, and trackers. Once you register online for the class, you will receive an email within a few days to accept the invitation and begin the class when the time   is right for you. The content will be available to you for 60 days. $60 for 60 days of online access for you and your support people.  Local Doulas: Natural Baby Doulas [email or web address] Tel:  [phone] [email or web address] [employer] [phone] [email or web address] The Labor Hassell Halim  (also do waterbirth tub rental) [phone] [email or web address] Triad Birth Doula [phone] [email or web address].fi Sacred Rhythms  [phone] https://[email or web address]-[email or web address]/ [employer] (PADA) [email or web address]/ [place] Birth and Baby  [email or web address]/ Considering Waterbirth? Guide for patients at [facility] [employer]  Why consider waterbirth?  . Gentle birth for babies . Less pain medicine used in labor . May allow for passive descent/less pushing . May reduce perineal tears  . More mobility and instinctive maternal position changes . Increased maternal relaxation . Reduced blood pressure in labor  Is waterbirth safe? What are the risks of infection, drowning or other complications?  . Infection: o Very low risk (3.7 % for tub vs 4.8% for bed) o 7 in 8000 waterbirths with documented infection o Poorly cleaned equipment most common cause o Slightly lower group B strep transmission rate  . Drowning o Maternal:  - Very low risk   - Related to seizures or fainting o Newborn:  - Very low risk. No evidence of increased risk of respiratory problems in multiple large studies - Physiological protection from breathing under water - Avoid underwater birth if there are any fetal complications - Once baby's head is out of the water, keep it out.  . Birth complication o Some reports of cord trauma, but risk decreased by bringing baby to surface gradually o No evidence of increased risk of shoulder dystocia. Mothers can usually change positions faster in water than in a bed, possibly aiding the maneuvers to free the shoulder.   You must attend a Doren Custard class at [facility]  3rd Wednesday of every month from 7-9pm  [employer] by calling [phone] or online at [email or web address]  Bring [place] the certificate from the class to your prenatal appointment  Meet with a midwife at 36 weeks to see if you can still plan a waterbirth and to sign the consent.   Purchase or rent the following supplies:   Water Birth Pool (Birth Pool in a Box or [place] for instance)  (Tubs start ~$125)  Single-use disposable tub liner designed for your brand of tub  New garden hose labeled "lead-free", "suitable for drinking water",  Electric drain pump to remove water (We recommend 792 gallon per hour or greater pump.)   Separate garden hose to remove the dirty water  Fish net  Bathing suit top (optional)  Long-handled mirror (optional)  Places to purchase or rent supplies  GotWebTools.is for tub purchases and supplies  Waterbirthsolutions.com for tub purchases and supplies  The Labor Ladies (www.[email or web address]) $275 for tub rental/set-up & take down/kit   [employer] (http://www.fleming.com/.htm) Information regarding doulas (labor support) who provide pool rentals  Our practice has a Birth Pool in a Box tub at the hospital that you may borrow on a first-come-first-served basis. It is your responsibility to to set up, clean and break down the tub. We cannot guarantee the availability of this tub in advance. You are responsible for bringing all accessories listed above. If you do not have all necessary supplies you cannot have a waterbirth.    Things that would prevent you from having a waterbirth:  Premature, <37wks  Previous cesarean birth  Presence of thick meconium-stained fluid  Multiple gestation (Twins,  triplets, etc.)  Uncontrolled diabetes or gestational diabetes requiring medication  Hypertension requiring medication or diagnosis of pre-eclampsia  Heavy vaginal bleeding  Non-reassuring fetal  heart rate  Active infection (MRSA, etc.). Group B Strep is NOT a contraindication for  waterbirth.  If your labor has to be induced and induction method requires continuous  monitoring of the baby's heart rate  Other risks/issues identified by your obstetrical provider  Please remember that birth is unpredictable. Under certain unforeseeable circumstances your provider may advise against giving birth in the tub. These decisions will be made on a case-by-case basis and with the safety of you and your baby as our highest priority.     Places to have your son circumcised:    [facility] [phone] while you are in hospital  [facility] [phone] $244 by 4 wks  [facility] [phone] $175 by 2 wks  [facility] [phone] $250 by 7 days [facility] [phone] $269 by 4 wks  These prices sometimes change but are roughly what you can expect to pay. Please call and confirm pricing.   Circumcision is considered an elective/non-medically necessary procedure. There are many reasons parents decide to have their sons circumsized. During the first year of life circumcised males have a reduced risk of urinary tract infections but after this year the rates between circumcised males and uncircumcised males are the same.  It is safe to have your son circumcised outside of the hospital and the places above perform them regularly.   Deciding about Circumcision in Baby Boys  (Up-to-date The Basics)  What is circumcision?  Circumcision is a surgery that removes the skin that covers the tip of the penis, called the "foreskin" Circumcision is usually done when a boy is between 72 and 58 days old. In the [place], circumcision is common. In some other countries, fewer boys are circumcised. Circumcision is a common tradition in some  religions.  Should I have my baby boy circumcised?  There is no easy answer. Circumcision has some benefits. But it also has risks. After talking with your doctor, you will have to decide for yourself what is right for your family.  What are the benefits of circumcision?  Circumcised boys seem to have slightly lower rates of: ?Urinary tract infections ?Swelling of the opening at the tip of the penis Circumcised men seem to have slightly lower rates of: ?Urinary tract infections ?Swelling of the opening at the tip of the penis ?Penis cancer ?HIV and other infections that you catch during sex ?Cervical cancer in the women they have sex with Even so, in the [place], the risks of these problems are small - even in boys and men who have not been circumcised. Plus, boys and men who are not circumcised can reduce these extra risks by: ?Cleaning their penis well ?Using condoms during sex  What are the risks of circumcision?  Risks include: ?Bleeding or infection from the surgery ?Damage to or amputation of the penis ?A chance that the doctor will cut off too much or not enough of the foreskin ?A chance that sex won't feel as good later in life Only about 1 out of every 200 circumcisions leads to problems. There is also a chance that your health insurance won't pay for circumcision.  How is circumcision done in baby boys?  First, the baby gets medicine for pain relief. This might be a cream on the skin or a shot into the base of the penis. Next, the doctor cleans the baby's  penis well. Then he or she uses special tools to cut off the foreskin. Finally, the doctor wraps a bandage (called gauze) around the baby's penis. If you have your baby circumcised, his doctor or nurse will give you instructions on how to care for him after the surgery. It is important that you follow those instructions carefully.

## 2017-07-26 NOTE — Addendum Note (Signed)
Addended by: Lorelle GibbsWILSON, CHIQUITA L on: 07/26/2017 04:50 PM   Modules accepted: Orders

## 2017-07-26 NOTE — Progress Notes (Signed)
Pt informed that the ultrasound is considered a limited OB ultrasound and is not intended to be a complete ultrasound exam.  Patient also informed that the ultrasound is not being completed with the intent of assessing for fetal or placental anomalies or any pelvic abnormalities.  Explained that the purpose of today's ultrasound is to assess for viability and estimated gestational age.  Patient acknowledges the purpose of the exam and the limitations of the study.    Single IUP Yolk sac visible Fetal Pole visible Cardiac activity present - 175 bpm per M-mode Fetal movement present

## 2017-07-26 NOTE — Progress Notes (Signed)
  Subjective:    Andrea [name] is being seen today for her first obstetrical visit.  This is not a planned pregnancy. She is at 5357w2d gestation. Her obstetrical history is significant for Type 2 DM, history PPROM and PTD. Relationship with FOB: spouse, living together. Patient does intend to breast feed. Pregnancy history fully reviewed.  Patient reports no complaints.  Patient doesn't have a log of recent CBG. She reports that her meter has a dead battery, but she is using her mom's meter. Self report that her glucose levels have been around 100-130, "never above 130".   Review of Systems:   Review of Systems  All other systems reviewed and are negative.   Objective:     BP 126/63   Pulse 75   Wt 251 lb 11.2 oz (114.2 kg)   LMP 05/08/2017 (Exact Date)   BMI 46.04 kg/m  Physical Exam  Nursing note and vitals reviewed. Constitutional: She is oriented to person, place, and time. She appears well-developed and well-nourished. No distress.  HENT:  Head: Normocephalic.  Cardiovascular: Normal rate.  Respiratory: Effort normal.  GI: Soft. There is no tenderness. There is no rebound.  Neurological: She is alert and oriented to person, place, and time.  Skin: Skin is warm and dry.  Psychiatric: She has a normal mood and affect.    Maternal Exam:  Abdomen: Fundal height is 11.       Fetal Exam Fetal Monitor Review: Mode: ultrasound.   Baseline rate: 175.         Assessment:    Pregnancy: G2P0101 Patient Active Problem List   Diagnosis Date Noted  . History of preterm delivery, currently pregnant 07/26/2017  . Supervision of other high risk pregnancy, antepartum 07/26/2017  . Healthcare maintenance 06/01/2016  . Morbid obesity (HCC) 02/25/2016  . Previous cesarean delivery affecting pregnancy 09/17/2015  . Diabetes mellitus type 2, uncontrolled, without complications (HCC) 10/31/2012       Plan:     Initial labs drawn. A1C, CMET and P:Cr today  Prenatal  vitamins. Problem list reviewed and updated. Panorama discussed: requested. Role of ultrasound in pregnancy discussed; fetal survey: requested. Amniocentesis discussed: not indicated. Follow up in 4 weeks. 50% of 30 min visit spent on counseling and coordination of care.   Start baby ASA at 12 weeks  Plan for 17-P at 16 weeks  Will need fetal echo  Currently on Lantus 55 U. Stopped Novolog when she was seen at her last visit. Thressa ShellerHeather Daden Mahany 07/26/2017

## 2017-07-26 NOTE — Addendum Note (Signed)
Addended by: Thressa ShellerHOGAN, Lorell Thibodaux D on: 07/26/2017 03:25 PM   Modules accepted: Orders

## 2017-07-27 LAB — COMPREHENSIVE METABOLIC PANEL
ALBUMIN: 4.3 g/dL (ref 3.5–5.5)
ALK PHOS: 59 IU/L (ref 39–117)
ALT: 6 IU/L (ref 0–32)
AST: 10 IU/L (ref 0–40)
Albumin/Globulin Ratio: 1.5 (ref 1.2–2.2)
BUN / CREAT RATIO: 7 — AB (ref 9–23)
BUN: 4 mg/dL — ABNORMAL LOW (ref 6–20)
CO2: 18 mmol/L — AB (ref 20–29)
Calcium: 9.4 mg/dL (ref 8.7–10.2)
Chloride: 101 mmol/L (ref 96–106)
Creatinine, Ser: 0.54 mg/dL — ABNORMAL LOW (ref 0.57–1.00)
GFR calc Af Amer: 150 mL/min/{1.73_m2} (ref >59)
GFR calc non Af Amer: 130 mL/min/{1.73_m2} (ref >59)
GLOBULIN, TOTAL: 2.9 g/dL (ref 1.5–4.5)
GLUCOSE: 87 mg/dL (ref 65–99)
Potassium: 4.3 mmol/L (ref 3.5–5.2)
SODIUM: 139 mmol/L (ref 134–144)
Total Protein: 7.2 g/dL (ref 6.0–8.5)

## 2017-07-27 LAB — PROTEIN / CREATININE RATIO, URINE
CREATININE, UR: 82.8 mg/dL
PROTEIN UR: 6.6 mg/dL
Protein/Creat Ratio: 80 mg/g creat (ref 0–200)

## 2017-07-27 LAB — HEMOGLOBIN A1C
Est. average glucose Bld gHb Est-mCnc: 140 mg/dL
Hgb A1c MFr Bld: 6.5 % — ABNORMAL HIGH (ref 4.8–5.6)

## 2017-07-28 LAB — OBSTETRIC PANEL, INCLUDING HIV
ANTIBODY SCREEN: NEGATIVE
BASOS: 0 %
Basophils Absolute: 0 10*3/uL (ref 0.0–0.2)
EOS (ABSOLUTE): 0.2 10*3/uL (ref 0.0–0.4)
EOS: 3 %
HEMATOCRIT: 31.8 % — AB (ref 34.0–46.6)
HEP B S AG: NEGATIVE
HIV Screen 4th Generation wRfx: NONREACTIVE
Hemoglobin: 10.1 g/dL — ABNORMAL LOW (ref 11.1–15.9)
Immature Grans (Abs): 0 10*3/uL (ref 0.0–0.1)
Immature Granulocytes: 0 %
LYMPHS ABS: 2.4 10*3/uL (ref 0.7–3.1)
Lymphs: 30 %
MCH: 22.7 pg — AB (ref 26.6–33.0)
MCHC: 31.8 g/dL (ref 31.5–35.7)
MCV: 72 fL — AB (ref 79–97)
MONOS ABS: 0.5 10*3/uL (ref 0.1–0.9)
Monocytes: 6 %
Neutrophils Absolute: 4.9 10*3/uL (ref 1.4–7.0)
Neutrophils: 61 %
Platelets: 386 10*3/uL — ABNORMAL HIGH (ref 150–379)
RBC: 4.44 x10E6/uL (ref 3.77–5.28)
RDW: 19.7 % — ABNORMAL HIGH (ref 12.3–15.4)
RH TYPE: POSITIVE
RPR: NONREACTIVE
Rubella Antibodies, IGG: 0.96 index — ABNORMAL LOW (ref >0.99)
WBC: 8 10*3/uL (ref 3.4–10.8)

## 2017-07-28 LAB — CULTURE, OB URINE

## 2017-07-28 LAB — URINE CULTURE, OB REFLEX

## 2017-08-02 ENCOUNTER — Encounter: Payer: Self-pay | Admitting: *Deleted

## 2017-08-03 LAB — SMN1 COPY NUMBER ANALYSIS (SMA CARRIER SCREENING)

## 2017-08-18 ENCOUNTER — Encounter: Payer: Self-pay | Admitting: *Deleted

## 2017-08-21 ENCOUNTER — Other Ambulatory Visit: Payer: Self-pay

## 2017-08-21 ENCOUNTER — Inpatient Hospital Stay (HOSPITAL_COMMUNITY)
Admission: AD | Admit: 2017-08-21 | Discharge: 2017-08-21 | Disposition: A | Discharge status: Home / Self Care | Payer: Medicaid Other | Source: Ambulatory Visit | Attending: Family Medicine | Admitting: Family Medicine

## 2017-08-21 ENCOUNTER — Encounter (HOSPITAL_COMMUNITY): Payer: Self-pay

## 2017-08-21 DIAGNOSIS — O219 Vomiting of pregnancy, unspecified: Secondary | ICD-10-CM

## 2017-08-21 DIAGNOSIS — Z8349 Family history of other endocrine, nutritional and metabolic diseases: Secondary | ICD-10-CM | POA: Diagnosis not present

## 2017-08-21 DIAGNOSIS — Z8249 Family history of ischemic heart disease and other diseases of the circulatory system: Secondary | ICD-10-CM | POA: Diagnosis not present

## 2017-08-21 DIAGNOSIS — R112 Nausea with vomiting, unspecified: Secondary | ICD-10-CM | POA: Diagnosis present

## 2017-08-21 DIAGNOSIS — O99612 Diseases of the digestive system complicating pregnancy, second trimester: Secondary | ICD-10-CM | POA: Diagnosis not present

## 2017-08-21 DIAGNOSIS — O24912 Unspecified diabetes mellitus in pregnancy, second trimester: Secondary | ICD-10-CM | POA: Insufficient documentation

## 2017-08-21 DIAGNOSIS — T6291XA Toxic effect of unspecified noxious substance eaten as food, accidental (unintentional), initial encounter: Secondary | ICD-10-CM | POA: Insufficient documentation

## 2017-08-21 DIAGNOSIS — Z833 Family history of diabetes mellitus: Secondary | ICD-10-CM | POA: Diagnosis not present

## 2017-08-21 DIAGNOSIS — Z7982 Long term (current) use of aspirin: Secondary | ICD-10-CM | POA: Insufficient documentation

## 2017-08-21 DIAGNOSIS — Z794 Long term (current) use of insulin: Secondary | ICD-10-CM | POA: Diagnosis not present

## 2017-08-21 DIAGNOSIS — Z3A15 15 weeks gestation of pregnancy: Secondary | ICD-10-CM | POA: Diagnosis not present

## 2017-08-21 DIAGNOSIS — IMO0001 Reserved for inherently not codable concepts without codable children: Secondary | ICD-10-CM

## 2017-08-21 DIAGNOSIS — Z823 Family history of stroke: Secondary | ICD-10-CM | POA: Insufficient documentation

## 2017-08-21 LAB — URINALYSIS, ROUTINE W REFLEX MICROSCOPIC
BILIRUBIN URINE: NEGATIVE
Bacteria, UA: NONE SEEN
GLUCOSE, UA: NEGATIVE mg/dL
HGB URINE DIPSTICK: NEGATIVE
KETONES UR: NEGATIVE mg/dL
Nitrite: NEGATIVE
Protein, ur: NEGATIVE mg/dL
Specific Gravity, Urine: 1.016 (ref 1.005–1.030)
pH: 7 (ref 5.0–8.0)

## 2017-08-21 MED ORDER — PROMETHAZINE HCL 25 MG PO TABS: 25.0000 mg | ORAL_TABLET | Freq: Four times a day (QID) | ORAL | 0 refills | Status: DC | PRN

## 2017-08-21 MED ORDER — ONDANSETRON 8 MG PO TBDP
8.0000 mg | ORAL_TABLET | Freq: Once | ORAL | Status: AC
  Administered 2017-08-21: 8 mg via ORAL
  Filled 2017-08-21: qty 1

## 2017-08-21 NOTE — MAU Note (Signed)
Presents to MAU afgter eating at [facility] yestarday and n/v since.  Today Vomiting x4.   Denies Vaginal bleeding, LOF, abnormal d/c

## 2017-08-21 NOTE — Progress Notes (Addendum)
G2P1 @ [redacted] wksga. Here dt n/v today at 40. Thinks from consumption of [employer] yesterday as her mom also was not feeling well.   Denies LOF or bleeding.   Doppler 159  Provider at bs assessing.   2152: medicated per order.   2215: D/c instructions given with pt understanding. Pt left unit via ambulatory with SO.

## 2017-08-21 NOTE — Discharge Instructions (Signed)
Food Poisoning Food poisoning is an illness that is caused by eating or drinking contaminated foods or drinks. In most cases, food poisoning is mild and lasts 1-2 days. However, some cases can be serious, especially for people who have weak body defense (immune) systems, older people, children and infants, and pregnant women. What are the causes? Foods can become contaminated with viruses, bacteria, parasites, mold, or chemicals as a result of:  Poor personal hygiene, such as poor hand washing practices.  Storing food improperly, such as not refrigerating raw meat.  Using unclean surfaces for serving, preparing, and storing food.  Cooking or eating with unclean utensils.  If contaminated food is eaten, viruses, bacteria, or parasites can harm the intestine. This often causes severe diarrhea. The most common causes of food poisoning include:  Viruses, such as: ? Norovirus. ? Rotavirus.  Bacteria, such as: ? Salmonella. ? Listeria. ? E. coli (Escherichia coli).  Parasites, such as: ? Giardia. ? Toxoplasmosis.  What are the signs or symptoms? Symptoms may take several hours to appear after you consume contaminated food or drink. Symptoms include:  Nausea.  Vomiting.  Cramping.  Diarrhea.  Fever and chills.  Muscle aches.  Dehydration. Dehydration can cause you to be tired and thirsty, have a dry mouth, and urinate less frequently.  How is this diagnosed? Your health care provider can diagnose food poisoning with a medical history and physical exam. This will include asking you what you have recently eaten. You may also have tests, including:  Blood tests.  Stool tests.  How is this treated? Treatment focuses on relieving your symptoms and making sure that you are hydrated. You may also be given medicines. In severe cases, hospitalization may be required and you may need to receive fluids through an IV tube. Follow these instructions at home: Eating and  drinking   Drink enough fluids to keep your urine clear or pale yellow. You may need to drink small amounts of clear liquids frequently.  Avoid milk, caffeine, and alcohol.  Ask your health care provider for specific rehydration instructions.  Eat small, frequent meals rather than large meals. Medicines  Take over-the-counter and prescription medicines only as told by your health care provider. Ask your health care provider if you should continue to take any of your regular prescribed and over-the-counter medicines.  If you were prescribed an antibiotic medicine, take it as told by your health care provider. Do not stop taking the antibiotic even if you start to feel better. General instructions  Wash your hands thoroughly before you prepare food and after you go to the bathroom (use the toilet). Make sure people who live with you also wash their hands often.  Clean surfaces that you touch with a product that contains chlorine bleach.  Keep all follow-up visits as told by your health care provider. This is important. How is this prevented?  Wash your hands, food preparation surfaces, and utensils thoroughly before and after you handle raw foods.  Use separate food preparation surfaces and storage spaces for raw meat and for fruits and vegetables.  Keep refrigerated foods colder than 40F (5C).  Serve hot foods immediately or keep them heated above 140F (60C).  Store dry foods in cool, dry spaces away from excess heat or moisture. Throw out any foods that do not smell right or are in cans that are bulging.  Follow approved canning procedures.  Heat canned foods thoroughly before you taste them.  Drink bottled or sterile water when you travel.   Get help right away if: Call 911 or go to the emergency room if:  You have difficulty breathing, swallowing, talking, or moving.  You develop blurred vision.  You cannot eat or drink without vomiting.  You faint.  Your eyes  turn yellow.  Your vomiting or diarrhea is persistent.  Abdominal pain develops, increases, or localizes in one small area.  You have a fever.  You have blood or mucus in your stools, or your stools look dark black and tarry.  You have signs of dehydration, such as: ? Dark urine, very little urine, or no urine. ? Cracked lips. ? Not making tears while crying. ? Dry mouth. ? Sunken eyes. ? Sleepiness. ? Weakness. ? Dizziness.  This information is not intended to replace advice given to you by your health care provider. Make sure you discuss any questions you have with your health care provider. Document Released: 12/27/2003 Document Revised: 08/27/2015 Document Reviewed: 10/01/2014 Elsevier Interactive Patient Education  2018 Elsevier Inc.  

## 2017-08-21 NOTE — MAU Provider Note (Signed)
History     CSN: [id number]  Arrival date and time: 08/21/17 2039   First Provider Initiated Contact with Patient 08/21/17 2126     Chief Complaint  Patient presents with  . Nausea   HPI Andrea [name] is a 27 y.o. G2P0101 at [redacted]w[redacted]d who presents with nausea and vomiting. She states she had [employer] yesterday and has vomited 4 times since. She states her mother has similar symptoms. She denies any abdominal pain, vaginal bleeding or discharge.   OB History    Gravida  2   Para  1   Term  0   Preterm  1   AB  0   Living  1     SAB  0   TAB  0   Ectopic  0   Multiple  0   Live Births  1           Past Medical History:  Diagnosis Date  . Anemia    as a teenager  . Chicken pox   . Diabetes mellitus    type 2 on insulin  . Impacted teeth with abnormal position    impacted wisdom teeth    Past Surgical History:  Procedure Laterality Date  . CESAREAN SECTION N/A 09/16/2015   Procedure: CESAREAN SECTION;  Surgeon: Lesly Dukes, MD;  Location: [facility] BIRTHING SUIT[facility];  Service: Obstetrics;  Laterality: N/A;  . FOREIGN BODY REMOVAL Left 08/28/2015   Procedure: REMOVAL FOREIGN BODY foot;  Surgeon: Dannielle Huh, MD;  Location: [facility] OR;  Service: Orthopedics;  Laterality: Left;  regional block  . NO PAST SURGERIES    . TOOTH EXTRACTION N/A 07/24/2016   Procedure: EXTRACTION WISDOM TEETH ONE, SIXTEEN, SEVENTEEN AND THIRTY-TWO;  Surgeon: Ocie Doyne, DDS;  Location: [facility] OR;  Service: Oral Surgery;  Laterality: N/A;    Family History  Problem Relation Age of Onset  . Stroke Father   . Hypertension Father   . Diabetes Mellitus II Father   . Irregular heart beat Father   . Heart disease Father   . Hyperlipidemia Father   . Diabetes Mellitus II Mother   . Hyperlipidemia Mother   . Thyroid disease Mother   . Hypertension Maternal Aunt   . Thyroid disease Maternal Aunt   . Diabetes Mellitus II Paternal Grandmother   . Anesthesia problems Neg Hx     Social History    Tobacco Use  . Smoking status: Never Smoker  . Smokeless tobacco: Never Used  Substance Use Topics  . Alcohol use: Yes    Comment: rare  . Drug use: No    Allergies:  Allergies  Allergen Reactions  . Metformin And Related Diarrhea  . No Known Allergies     Medications Prior to Admission  Medication Sig Dispense Refill Last Dose  . aspirin EC 81 MG tablet Take 1 tablet (81 mg total) by mouth daily. Start at 12 weeks (07/31/17) 30 tablet 11   . Insulin Glargine (LANTUS SOLOSTAR) 100 UNIT/ML Solostar Pen Inject 55 Units into the skin daily at 10 pm. 5 pen 0 Taking  . Prenatal Vit-Fe Fumarate-FA (PREPLUS) 27-1 MG TABS Take 1 tablet by mouth daily.   Taking    Review of Systems  Constitutional: Negative.  Negative for fatigue and fever.  HENT: Negative.   Respiratory: Negative.  Negative for shortness of breath.   Cardiovascular: Negative.  Negative for chest pain.  Gastrointestinal: Positive for nausea and vomiting. Negative for abdominal pain, constipation and diarrhea.  Genitourinary: Negative.  Negative for dysuria, vaginal bleeding and vaginal discharge.  Neurological: Negative.  Negative for dizziness and headaches.   Physical Exam   Blood pressure (!) 141/88, resp. rate (!) 98, height  (1.575 m), weight 249 lb 1.4 oz (113 kg), last menstrual period 05/08/2017, currently breastfeeding.  Physical Exam  Nursing note and vitals reviewed. Constitutional: She is oriented to person, place, and time. She appears well-developed and well-nourished. No distress.  HENT:  Head: Normocephalic.  Eyes: Pupils are equal, round, and reactive to light.  Cardiovascular: Normal rate, regular rhythm and normal heart sounds.  Respiratory: Effort normal and breath sounds normal. No respiratory distress.  GI: Soft. Bowel sounds are normal. She exhibits no distension. There is no tenderness.  Neurological: She is alert and oriented to person, place, and time.  Skin: Skin is warm and  dry.  Psychiatric: She has a normal mood and affect. Her behavior is normal. Judgment and thought content normal.   FHT: 159 bpm  MAU Course  Procedures Results for orders placed or performed during the hospital encounter of 08/21/17 (from the past 24 hour(s))  Urinalysis, Routine w reflex microscopic     Status: Abnormal   Collection Time: 08/21/17  8:45 PM  Result Value Ref Range   Color, Urine YELLOW YELLOW   APPearance HAZY (A) CLEAR   Specific Gravity, Urine 1.016 1.005 - 1.030   pH 7.0 5.0 - 8.0   Glucose, UA NEGATIVE NEGATIVE mg/dL   Hgb urine dipstick NEGATIVE NEGATIVE   Bilirubin Urine NEGATIVE NEGATIVE   Ketones, ur NEGATIVE NEGATIVE mg/dL   Protein, ur NEGATIVE NEGATIVE mg/dL   Nitrite NEGATIVE NEGATIVE   Leukocytes, UA TRACE (A) NEGATIVE   RBC / HPF 0-5 0 - 5 RBC/hpf   WBC, UA 0-5 0 - 5 WBC/hpf   Bacteria, UA NONE SEEN NONE SEEN   Squamous Epithelial / LPF 6-10 0 - 5   Mucus PRESENT      MDM UA Zofran  ODT  Assessment and Plan   1. Food poisoning, accidental or unintentional, initial encounter   2. [redacted] weeks gestation of pregnancy    -Discharge home in stable condition -Rx for phenergan given to patient -BRAT diet and food poisoning precautions discussed -Patient advised to follow-up with [facility] as scheduled for prenatal care -Patient may return to MAU as needed or if her condition were to change or worsen  Rolm Bookbinder CNM 08/21/2017, 9:26 PM

## 2017-08-23 ENCOUNTER — Encounter: Payer: Self-pay | Admitting: Obstetrics and Gynecology

## 2017-08-23 ENCOUNTER — Ambulatory Visit (INDEPENDENT_AMBULATORY_CARE_PROVIDER_SITE_OTHER): Payer: Self-pay | Admitting: Obstetrics and Gynecology

## 2017-08-23 VITALS — BP 123/54 | HR 84 | Wt 249.8 lb

## 2017-08-23 DIAGNOSIS — O99210 Obesity complicating pregnancy, unspecified trimester: Secondary | ICD-10-CM

## 2017-08-23 DIAGNOSIS — IMO0001 Reserved for inherently not codable concepts without codable children: Secondary | ICD-10-CM

## 2017-08-23 DIAGNOSIS — O34219 Maternal care for unspecified type scar from previous cesarean delivery: Secondary | ICD-10-CM

## 2017-08-23 DIAGNOSIS — E1165 Type 2 diabetes mellitus with hyperglycemia: Secondary | ICD-10-CM

## 2017-08-23 DIAGNOSIS — O09899 Supervision of other high risk pregnancies, unspecified trimester: Secondary | ICD-10-CM

## 2017-08-23 DIAGNOSIS — O09219 Supervision of pregnancy with history of pre-term labor, unspecified trimester: Secondary | ICD-10-CM

## 2017-08-23 DIAGNOSIS — Z6841 Body Mass Index (BMI) 40.0 and over, adult: Secondary | ICD-10-CM

## 2017-08-23 LAB — POCT URINALYSIS DIP (DEVICE)
Bilirubin Urine: NEGATIVE
Glucose, UA: NEGATIVE mg/dL
HGB URINE DIPSTICK: NEGATIVE
Ketones, ur: NEGATIVE mg/dL
NITRITE: NEGATIVE
PH: 6 (ref 5.0–8.0)
Protein, ur: NEGATIVE mg/dL
SPECIFIC GRAVITY, URINE: 1.02 (ref 1.005–1.030)
UROBILINOGEN UA: 2 mg/dL — AB (ref 0.0–1.0)

## 2017-08-23 NOTE — Progress Notes (Signed)
Prenatal Visit Note Date: 08/23/2017 Clinic: [facility]  Subjective:  Andrea [name] is a 27 y.o. G2P0101 at [redacted]w[redacted]d being seen today for ongoing prenatal care.  She is currently monitored for the following issues for this high-risk pregnancy and has Diabetes mellitus type 2, uncontrolled, without complications (HCC); Previous cesarean delivery affecting pregnancy; BMI 40.0-44.9, adult (HCC); Healthcare maintenance; History of preterm delivery, currently pregnant; Supervision of other high risk pregnancy, antepartum; and Obesity in pregnancy on their problem list.  Patient reports no complaints.   Contractions: Not present. Vag. Bleeding: None.  Movement: Absent. Denies leaking of fluid.   The following portions of the patient's history were reviewed and updated as appropriate: allergies, current medications, past family history, past medical history, past social history, past surgical history and problem list. Problem list updated.  Objective:   Vitals:   08/23/17 1414  BP: (!) 123/54  Pulse: 84  Weight: 249 lb 12.8 oz (113.3 kg)    Fetal Status: Fetal Heart Rate (bpm): 155   Movement: Absent     General:  Alert, oriented and cooperative. Patient is in no acute distress.  Skin: Skin is warm and dry. No rash noted.   Cardiovascular: Normal heart rate noted  Respiratory: Normal respiratory effort, no problems with respiration noted  Abdomen: Soft, gravid, appropriate for gestational age. Pain/Pressure: Absent     Pelvic:  Cervical exam deferred        Extremities: Normal range of motion.  Edema: None  Mental Status: Normal mood and affect. Normal behavior. Normal judgment and thought content.   Urinalysis:      Assessment and Plan:  Pregnancy: G2P0101 at [redacted]w[redacted]d  1. Obesity in pregnancy 6 lbs weight gain. Keep close eye on - Hemoglobin A1c - AFP, Serum, Open Spina Bifida  2. Diabetes mellitus type 2, uncontrolled, without complications (HCC) Pt states on  Lantus 55 qhs. Patient didn't bring her log book or meter and doesn't sound like she's consistently taking her BS. Importance of good control d/w her and when to check and ranges we want d/w her too. Will get a1c and f/u next visit and have her see dm education nv. Pt states needs to make appt at [facility].  - Hemoglobin A1c - AFP, Serum, Open Spina Bifida  3. History of preterm delivery, currently pregnant [redacted]wk pprom. Pt amenable to 17p. Forms signed today and hope to start when we see her back nv  4. Supervision of other high risk pregnancy, antepartum Routine care. afp today. Pt confirms on baby asa. Already has anatomy u/s scheduled. Follow cx length then.  - Hemoglobin A1c - AFP, Serum, Open Spina Bifida  5. Previous cesarean delivery affecting pregnancy D/w pt more later in pregnancy  6. BMI 40.0-44.9, adult (HCC) See above  Preterm labor symptoms and general obstetric precautions including but not limited to vaginal bleeding, contractions, leaking of fluid and fetal movement were reviewed in detail with the patient. Please refer to After Visit Summary for other counseling recommendations.  Return in about 1 week (around 08/30/2017).   Tropic Bing, MD

## 2017-08-25 LAB — AFP, SERUM, OPEN SPINA BIFIDA
AFP MoM: 1.03
AFP Value: 19.5 ng/mL
Gest. Age on Collection Date: 15.2 weeks
Maternal Age At EDD: 28 yr
OSBR RISK 1 IN: 6734
Test Results:: NEGATIVE
Weight: 249 [lb_av]

## 2017-08-25 LAB — HEMOGLOBIN A1C
ESTIMATED AVERAGE GLUCOSE: 140 mg/dL
Hgb A1c MFr Bld: 6.5 % — ABNORMAL HIGH (ref 4.8–5.6)

## 2017-08-27 ENCOUNTER — Telehealth: Payer: Self-pay | Admitting: *Deleted

## 2017-08-27 NOTE — Telephone Encounter (Signed)
Received a voicemail from 08/25/17 3:48pm that they would like to schedule shipment of her Cruzita Lederer and verify delivery information.   I called [facility] and provided needed information and they will ship out 08/30/17 and we should receive 08/31/17.

## 2017-08-30 ENCOUNTER — Encounter: Payer: Self-pay | Admitting: Obstetrics and Gynecology

## 2017-09-03 ENCOUNTER — Encounter: Payer: Self-pay | Admitting: Obstetrics & Gynecology

## 2017-09-03 ENCOUNTER — Ambulatory Visit (INDEPENDENT_AMBULATORY_CARE_PROVIDER_SITE_OTHER): Payer: Medicaid Other | Admitting: Obstetrics & Gynecology

## 2017-09-03 VITALS — BP 134/72 | HR 81 | Wt 252.0 lb

## 2017-09-03 DIAGNOSIS — O09212 Supervision of pregnancy with history of pre-term labor, second trimester: Secondary | ICD-10-CM | POA: Diagnosis present

## 2017-09-03 DIAGNOSIS — O34219 Maternal care for unspecified type scar from previous cesarean delivery: Secondary | ICD-10-CM

## 2017-09-03 LAB — POCT URINALYSIS DIP (DEVICE)
Bilirubin Urine: NEGATIVE
GLUCOSE, UA: NEGATIVE mg/dL
Hgb urine dipstick: NEGATIVE
Ketones, ur: NEGATIVE mg/dL
NITRITE: NEGATIVE
Protein, ur: NEGATIVE mg/dL
SPECIFIC GRAVITY, URINE: 1.025 (ref 1.005–1.030)
Urobilinogen, UA: 0.2 mg/dL (ref 0.0–1.0)
pH: 5.5 (ref 5.0–8.0)

## 2017-09-03 MED ORDER — HYDROXYPROGESTERONE CAPROATE 275 MG/1.1ML ~~LOC~~ SOAJ
275.0000 mg | SUBCUTANEOUS | Status: AC
  Administered 2017-09-03 – 2017-11-24 (×3): 275 mg via SUBCUTANEOUS

## 2017-09-03 NOTE — Progress Notes (Signed)
   PRENATAL VISIT NOTE  Subjective:  Andrea [name] is a 27 y.o. G2P0101 at [redacted]w[redacted]d being seen today for ongoing prenatal care.  She is currently monitored for the following issues for this high-risk pregnancy and has Diabetes mellitus type 2, uncontrolled, without complications (HCC); Previous cesarean delivery affecting pregnancy; BMI 40.0-44.9, adult (HCC); Healthcare maintenance; History of preterm delivery, currently pregnant; Supervision of other high risk pregnancy, antepartum; and Obesity in pregnancy on their problem list.  Patient reports no complaints.  Contractions: Not present. Vag. Bleeding: None.  Movement: Present. Denies leaking of fluid.   The following portions of the patient's history were reviewed and updated as appropriate: allergies, current medications, past family history, past medical history, past social history, past surgical history and problem list. Problem list updated.  Objective:   Vitals:   09/03/17 0831 09/03/17 0832  BP: (!) 144/73 134/72  Pulse: 83 81  Weight: 252 lb (114.3 kg)     Fetal Status: Fetal Heart Rate (bpm): 150   Movement: Present     General:  Alert, oriented and cooperative. Patient is in no acute distress.  Skin: Skin is warm and dry. No rash noted.   Cardiovascular: Normal heart rate noted  Respiratory: Normal respiratory effort, no problems with respiration noted  Abdomen: Soft, gravid, appropriate for gestational age.  Pain/Pressure: Present     Pelvic: Cervical exam deferred        Extremities: Normal range of motion.  Edema: None  Mental Status: Normal mood and affect. Normal behavior. Normal judgment and thought content.   Assessment and Plan:  Pregnancy: G2P0101 at [redacted]w[redacted]d  1. VBAC (vaginal birth after Cesarean) FBS nl, PP one elevated 200, has DM teaching scheduled, needs new meter  Preterm labor symptoms and general obstetric precautions including but not limited to vaginal bleeding, contractions, leaking of fluid and  fetal movement were reviewed in detail with the patient. Please refer to After Visit Summary for other counseling recommendations.  Return in about 2 weeks (around 09/17/2017).  Future Appointments  Date Time Provider Department Center  09/07/2017 10:00 AM [facility]-EDUCATION [facility]  09/07/2017  1:30 PM [facility] [place] 5 [facility]    Scheryl Darter, MD

## 2017-09-03 NOTE — Patient Instructions (Signed)
Second Trimester of Pregnancy The second trimester is from week 13 through week 28, month 4 through 6. This is often the time in pregnancy that you feel your best. Often times, morning sickness has lessened or quit. You may have more energy, and you may get hungry more often. Your unborn baby (fetus) is growing rapidly. At the end of the sixth month, he or she is about 9 inches long and weighs about 1 pounds. You will likely feel the baby move (quickening) between 18 and 20 weeks of pregnancy. Follow these instructions at home:  Avoid all smoking, herbs, and alcohol. Avoid drugs not approved by your doctor.  Do not use any tobacco products, including cigarettes, chewing tobacco, and electronic cigarettes. If you need help quitting, ask your doctor. You may get counseling or other support to help you quit.  Only take medicine as told by your doctor. Some medicines are safe and some are not during pregnancy.  Exercise only as told by your doctor. Stop exercising if you start having cramps.  Eat regular, healthy meals.  Wear a good support bra if your breasts are tender.  Do not use hot tubs, steam rooms, or saunas.  Wear your seat belt when driving.  Avoid raw meat, uncooked cheese, and liter boxes and soil used by cats.  Take your prenatal vitamins.  Take 1500-2000 milligrams of calcium daily starting at the 20th week of pregnancy until you deliver your baby.  Try taking medicine that helps you poop (stool softener) as needed, and if your doctor approves. Eat more fiber by eating fresh fruit, vegetables, and whole grains. Drink enough fluids to keep your pee (urine) clear or pale yellow.  Take warm water baths (sitz baths) to soothe pain or discomfort caused by hemorrhoids. Use hemorrhoid cream if your doctor approves.  If you have puffy, bulging veins (varicose veins), wear support hose. Raise (elevate) your feet for 15 minutes, 3-4 times a day. Limit salt in your diet.  Avoid heavy  lifting, wear low heals, and sit up straight.  Rest with your legs raised if you have leg cramps or low back pain.  Visit your dentist if you have not gone during your pregnancy. Use a soft toothbrush to brush your teeth. Be gentle when you floss.  You can have sex (intercourse) unless your doctor tells you not to.  Go to your doctor visits. Get help if:  You feel dizzy.  You have mild cramps or pressure in your lower belly (abdomen).  You have a nagging pain in your belly area.  You continue to feel sick to your stomach (nauseous), throw up (vomit), or have watery poop (diarrhea).  You have bad smelling fluid coming from your vagina.  You have pain with peeing (urination). Get help right away if:  You have a fever.  You are leaking fluid from your vagina.  You have spotting or bleeding from your vagina.  You have severe belly cramping or pain.  You lose or gain weight rapidly.  You have trouble catching your breath and have chest pain.  You notice sudden or extreme puffiness (swelling) of your face, hands, ankles, feet, or legs.  You have not felt the baby move in over an hour.  You have severe headaches that do not go away with medicine.  You have vision changes. This information is not intended to replace advice given to you by your health care provider. Make sure you discuss any questions you have with your health care   provider. Document Released: 06/24/2009 Document Revised: 09/05/2015 Document Reviewed: 05/31/2012 Elsevier Interactive Patient Education  2017 [employer] Inc.  

## 2017-09-07 ENCOUNTER — Ambulatory Visit: Payer: Self-pay | Admitting: *Deleted

## 2017-09-07 ENCOUNTER — Ambulatory Visit (HOSPITAL_COMMUNITY)
Admission: RE | Admit: 2017-09-07 | Discharge: 2017-09-07 | Disposition: A | Discharge status: Home / Self Care | Payer: Medicaid Other | Source: Ambulatory Visit | Attending: Advanced Practice Midwife | Admitting: Advanced Practice Midwife

## 2017-09-07 ENCOUNTER — Encounter (HOSPITAL_COMMUNITY): Payer: Self-pay

## 2017-09-07 ENCOUNTER — Other Ambulatory Visit: Payer: Self-pay | Admitting: Advanced Practice Midwife

## 2017-09-07 ENCOUNTER — Encounter: Payer: Self-pay | Admitting: Obstetrics and Gynecology

## 2017-09-07 ENCOUNTER — Encounter: Payer: Medicaid Other | Attending: Family Medicine | Admitting: *Deleted

## 2017-09-07 DIAGNOSIS — O24912 Unspecified diabetes mellitus in pregnancy, second trimester: Secondary | ICD-10-CM | POA: Diagnosis not present

## 2017-09-07 DIAGNOSIS — Z3A17 17 weeks gestation of pregnancy: Secondary | ICD-10-CM | POA: Insufficient documentation

## 2017-09-07 DIAGNOSIS — E1165 Type 2 diabetes mellitus with hyperglycemia: Secondary | ICD-10-CM | POA: Insufficient documentation

## 2017-09-07 DIAGNOSIS — O09892 Supervision of other high risk pregnancies, second trimester: Secondary | ICD-10-CM | POA: Diagnosis not present

## 2017-09-07 DIAGNOSIS — O09292 Supervision of pregnancy with other poor reproductive or obstetric history, second trimester: Secondary | ICD-10-CM | POA: Insufficient documentation

## 2017-09-07 DIAGNOSIS — O09899 Supervision of other high risk pregnancies, unspecified trimester: Secondary | ICD-10-CM | POA: Diagnosis present

## 2017-09-07 DIAGNOSIS — Z363 Encounter for antenatal screening for malformations: Secondary | ICD-10-CM | POA: Insufficient documentation

## 2017-09-07 DIAGNOSIS — IMO0001 Reserved for inherently not codable concepts without codable children: Secondary | ICD-10-CM

## 2017-09-07 DIAGNOSIS — O09299 Supervision of pregnancy with other poor reproductive or obstetric history, unspecified trimester: Secondary | ICD-10-CM

## 2017-09-07 DIAGNOSIS — O99212 Obesity complicating pregnancy, second trimester: Secondary | ICD-10-CM

## 2017-09-07 DIAGNOSIS — O09219 Supervision of pregnancy with history of pre-term labor, unspecified trimester: Secondary | ICD-10-CM

## 2017-09-07 DIAGNOSIS — Z713 Dietary counseling and surveillance: Secondary | ICD-10-CM | POA: Diagnosis present

## 2017-09-08 ENCOUNTER — Other Ambulatory Visit (HOSPITAL_COMMUNITY): Payer: Self-pay | Admitting: *Deleted

## 2017-09-08 DIAGNOSIS — Z8751 Personal history of pre-term labor: Secondary | ICD-10-CM

## 2017-09-08 DIAGNOSIS — O09219 Supervision of pregnancy with history of pre-term labor, unspecified trimester: Secondary | ICD-10-CM

## 2017-09-08 DIAGNOSIS — O24112 Pre-existing diabetes mellitus, type 2, in pregnancy, second trimester: Secondary | ICD-10-CM

## 2017-09-08 NOTE — Progress Notes (Signed)
Patient was seen on 09/07/2017 for type 2 Diabetes and pregnancy self-management. EDD 02/12/2018. She is here with her husband / SO, who expressed interest in learning more about her diabetes, and she agreed. Patient states history of this diabetes for almost 10 years.  Diet history obtained. Patient eats good variety of all food groups and beverages include coffee, water, flavored waters and some juice.  Her comfort level with carb counting is 5/10. Patient is currently on 55 units of Lantus insulin at night for her diabetes medications.  The following learning objectives were met by the patient :   States the definition of type 2 Diabetes and pregnancy   States why dietary management is important in controlling blood glucose  Describes the effects of carbohydrates on blood glucose levels  Demonstrates ability to create a balanced meal plan  Demonstrates carbohydrate counting   States when to check blood glucose levels  Demonstrates proper blood glucose monitoring techniques  States the effect of stress and exercise on blood glucose levels  States the importance of limiting caffeine and abstaining from alcohol and smoking  States definition of hypoglycemia, symptoms, causes and treatment options.   Plan:   Aim for 3 Carb Choices per meal (45 grams) +/- 1 either way   Aim for 1-2 Carbs per snack  Begin reading food labels for Total Carbohydrate of foods  Consider  increasing your activity level by walking or other activity daily as tolerated  Begin checking BG before breakfast and 2 hours after first bite of breakfast, lunch and dinner as directed by MD   Bring Log Book/Sheet to every medical appointment    Patient was not yet introduced to [employer], I plan to discuss at her next visit due to time constraints today.   Take medication as directed by MD  Patient already has a meter:  And is testing pre breakfast and 2 hours each meal as directed by MD Review of Log Book  shows: FBG: 85-110 with most below 100 mg/dl  Post meal BG running 59-130 with most between 100-125 mg/dl.  Patient instructed to monitor glucose levels: FBS: 60 - 95 mg/dl 2 hour: <120 mg/dl  Patient received the following handouts:  Nutrition Diabetes and Pregnancy  Carbohydrate Counting List  BG Log Sheet  Patient will be seen for follow-up in 1 month .

## 2017-09-20 ENCOUNTER — Encounter: Payer: Self-pay | Admitting: Obstetrics and Gynecology

## 2017-09-23 ENCOUNTER — Inpatient Hospital Stay (HOSPITAL_COMMUNITY)
Admission: AD | Admit: 2017-09-23 | Discharge: 2017-09-23 | Disposition: A | Discharge status: Home / Self Care | Payer: Medicaid Other | Source: Ambulatory Visit | Attending: Obstetrics and Gynecology | Admitting: Obstetrics and Gynecology

## 2017-09-23 ENCOUNTER — Encounter (HOSPITAL_COMMUNITY): Payer: Self-pay

## 2017-09-23 ENCOUNTER — Other Ambulatory Visit: Payer: Self-pay

## 2017-09-23 DIAGNOSIS — Z794 Long term (current) use of insulin: Secondary | ICD-10-CM

## 2017-09-23 DIAGNOSIS — O9989 Other specified diseases and conditions complicating pregnancy, childbirth and the puerperium: Secondary | ICD-10-CM | POA: Diagnosis not present

## 2017-09-23 DIAGNOSIS — O26892 Other specified pregnancy related conditions, second trimester: Secondary | ICD-10-CM | POA: Insufficient documentation

## 2017-09-23 DIAGNOSIS — R51 Headache: Secondary | ICD-10-CM | POA: Diagnosis present

## 2017-09-23 DIAGNOSIS — Z3A19 19 weeks gestation of pregnancy: Secondary | ICD-10-CM | POA: Insufficient documentation

## 2017-09-23 DIAGNOSIS — O24414 Gestational diabetes mellitus in pregnancy, insulin controlled: Secondary | ICD-10-CM | POA: Insufficient documentation

## 2017-09-23 DIAGNOSIS — G43009 Migraine without aura, not intractable, without status migrainosus: Secondary | ICD-10-CM

## 2017-09-23 DIAGNOSIS — E119 Type 2 diabetes mellitus without complications: Secondary | ICD-10-CM

## 2017-09-23 DIAGNOSIS — E1165 Type 2 diabetes mellitus with hyperglycemia: Secondary | ICD-10-CM

## 2017-09-23 DIAGNOSIS — O99612 Diseases of the digestive system complicating pregnancy, second trimester: Secondary | ICD-10-CM | POA: Diagnosis not present

## 2017-09-23 DIAGNOSIS — R4689 Other symptoms and signs involving appearance and behavior: Secondary | ICD-10-CM

## 2017-09-23 DIAGNOSIS — K5900 Constipation, unspecified: Secondary | ICD-10-CM | POA: Diagnosis not present

## 2017-09-23 DIAGNOSIS — IMO0001 Reserved for inherently not codable concepts without codable children: Secondary | ICD-10-CM

## 2017-09-23 LAB — URINALYSIS, ROUTINE W REFLEX MICROSCOPIC
Bilirubin Urine: NEGATIVE
Glucose, UA: NEGATIVE mg/dL
Hgb urine dipstick: NEGATIVE
Ketones, ur: NEGATIVE mg/dL
Leukocytes, UA: NEGATIVE
NITRITE: NEGATIVE
Protein, ur: NEGATIVE mg/dL
Specific Gravity, Urine: 1.013 (ref 1.005–1.030)
pH: 5 (ref 5.0–8.0)

## 2017-09-23 LAB — GLUCOSE, CAPILLARY: Glucose-Capillary: 90 mg/dL (ref 65–99)

## 2017-09-23 MED ORDER — INSULIN GLARGINE 100 UNIT/ML SOLOSTAR PEN: 55.0000 [IU] | PEN_INJECTOR | Freq: Every day | SUBCUTANEOUS | 0 refills | Status: DC

## 2017-09-23 MED ORDER — IBUPROFEN 600 MG PO TABS
600.0000 mg | ORAL_TABLET | Freq: Once | ORAL | Status: AC
  Administered 2017-09-23: 600 mg via ORAL
  Filled 2017-09-23: qty 1

## 2017-09-23 NOTE — Discharge Instructions (Signed)
Coping With Diabetes Diabetes (type 1 diabetes mellitus or type 2 diabetes mellitus) is a condition in which the body does not have enough of a hormone called insulin, or the body does not respond properly to insulin. Normally, insulin allows sugars (glucose) to enter cells in the body. The cells use glucose for energy. With diabetes, extra glucose builds up in the blood instead of going into cells, which results in high blood glucose (hyperglycemia). How to manage lifestyle changes Managing diabetes includes medical treatments as well as lifestyle changes. If diabetes is not managed well, serious physical and emotional complications can occur. Taking good care of yourself means that you are responsible for:  Monitoring glucose regularly.  Eating a healthy diet.  Exercising regularly.  Meeting with health care providers.  Taking medicines as directed.  Some people may feel a lot of stress about managing their diabetes. This is known as emotional distress, and it is very common. Living with diabetes can place you at risk for emotional distress, depression, or anxiety. These disorders can be confusing and can make diabetes management more difficult. How to recognize stress Emotional distress Symptoms of emotional distress include:  Anger about having a diagnosis of diabetes.  Fear or frustration about your diagnosis and the changes you need to make to manage the condition.  Being overly worried about the care that you need or the cost of the care you need.  Feeling like you caused your condition by doing something wrong.  Fear of unpredictable situations, like low or high blood glucose.  Feeling judged by your health care providers.  Feeling very alone with the disease.  Getting too tired or "burned out" with the demands of daily care.  Depression Having diabetes means that you are at a higher risk for depression. Having depression also means that you are at a higher risk for  diabetes. Your health care provider may test (screen) you for symptoms of depression. It is important to recognize depression symptoms and to start treatment for it soon after it is diagnosed. The following are some symptoms of depression:  Loss of interest in things that you used to enjoy.  Trouble sleeping, or often waking up early and not being able to get back to sleep.  A change in appetite.  Feeling tired most of the day.  Feeling nervous and anxious.  Feeling guilty and worrying that you are a burden to others.  Feeling depressed more often than you do not feel that way.  Thoughts of hurting yourself or feeling that you want to die.  If you have any of these symptoms for 2 weeks or longer, reach out to a health care provider. Where to find support  Ask your health care provider to recommend a therapist who understands both depression and diabetes.  Search for information and support from the American Diabetes Association: www.diabetes.org  Find a certified diabetes educator and make an appointment through [facility] [employer]: www.diabeteseducator.org Follow these instructions at home: Managing emotional distress The following are some ways to manage emotional distress:  Talk with your health care provider or certified diabetes educator. Consider working with a counselor or therapist.  Learn as much as you can about diabetes and its treatment. Meet with a certified diabetes educator or take a class to learn how to manage your condition.  Keep a journal of your thoughts and concerns.  Accept that some things are out of your control.  Talk with other people who have diabetes. It  can help to talk with others about the emotional distress that you feel.  Find ways to manage stress that work for you. These may include art or music therapy, exercise, meditation, and hobbies.  Seek support from spiritual leaders, family, and friends.  General  instructions  Follow your diabetes management plan.  Keep all follow-up visits as told by your health care provider. This is important. Get help right away if:  You have thoughts about hurting yourself or others. If you ever feel like you may hurt yourself or others, or have thoughts about taking your own life, get help right away. You can go to your nearest emergency department or call:  Your local emergency services (911 in the [place]  A suicide crisis helpline, such as the [place] at 4095364574. This is open 24 hours a day.  Summary  Diabetes (type 1 diabetes mellitus or type 2 diabetes mellitus) is a condition in which the body does not have enough of a hormone called insulin, or the body does not respond properly to insulin.  Living with diabetes puts you at risk for medical issues, and it also puts you at risk for emotional issues such as emotional distress, depression, and anxiety.  Recognizing the symptoms of emotional distress and depression may help you avoid problems with your diabetes control. It is important to start treatment for emotional distress and depression soon after they are diagnosed.  Having diabetes means that you are at a higher risk for depression. Ask your health care provider to recommend a therapist who understands both depression and diabetes.  If you experience symptoms of emotional distress or depression, it is important to discuss this with your health care provider, certified diabetes educator, or therapist. This information is not intended to replace advice given to you by your health care provider. Make sure you discuss any questions you have with your health care provider. Document Released: 08/13/2016 Document Revised: 08/13/2016 Document Reviewed: 08/13/2016 Elsevier Interactive Patient Education  2018 [employer].   Blood Glucose Monitoring, Adult Monitoring your blood sugar (glucose) helps you manage your  diabetes. It also helps you and your health care provider determine how well your diabetes management plan is working. Blood glucose monitoring involves checking your blood glucose as often as directed, and keeping a record (log) of your results over time. Why should I monitor my blood glucose? Checking your blood glucose regularly can:  Help you understand how food, exercise, illnesses, and medicines affect your blood glucose.  Let you know what your blood glucose is at any time. You can quickly tell if you are having low blood glucose (hypoglycemia) or high blood glucose (hyperglycemia).  Help you and your health care provider adjust your medicines as needed.  When should I check my blood glucose? Follow instructions from your health care provider about how often to check your blood glucose. This may depend on:  The type of diabetes you have.  How well-controlled your diabetes is.  Medicines you are taking.  If you have type 1 diabetes:  Check your blood glucose at least 2 times a day.  Also check your blood glucose: ? Before every insulin injection. ? Before and after exercise. ? Between meals. ? 2 hours after a meal. ? Occasionally between 2:00 a.m. and 3:00 a.m., as directed. ? Before potentially dangerous tasks, like driving or using heavy machinery. ? At bedtime.  You may need to check your blood glucose more often, up to 6-10 times a day: ?  If you use an insulin pump. ? If you need multiple daily injections (MDI). ? If your diabetes is not well-controlled. ? If you are ill. ? If you have a history of severe hypoglycemia. ? If you have a history of not knowing when your blood glucose is getting low (hypoglycemia unawareness). If you have type 2 diabetes:  If you take insulin or other diabetes medicines, check your blood glucose at least 2 times a day.  If you are on intensive insulin therapy, check your blood glucose at least 4 times a day. Occasionally, you may also  need to check between 2:00 a.m. and 3:00 a.m., as directed.  Also check your blood glucose: ? Before and after exercise. ? Before potentially dangerous tasks, like driving or using heavy machinery.  You may need to check your blood glucose more often if: ? Your medicine is being adjusted. ? Your diabetes is not well-controlled. ? You are ill. What is a blood glucose log?  A blood glucose log is a record of your blood glucose readings. It helps you and your health care provider: ? Look for patterns in your blood glucose over time. ? Adjust your diabetes management plan as needed.  Every time you check your blood glucose, write down your result and notes about things that may be affecting your blood glucose, such as your diet and exercise for the day.  Most glucose meters store a record of glucose readings in the meter. Some meters allow you to download your records to a computer. How do I check my blood glucose? Follow these steps to get accurate readings of your blood glucose: Supplies needed   Blood glucose meter.  Test strips for your meter. Each meter has its own strips. You must use the strips that come with your meter.  A needle to prick your finger (lancet). Do not use lancets more than once.  A device that holds the lancet (lancing device).  A journal or log book to write down your results. Procedure  Wash your hands with soap and water.  Prick the side of your finger (not the tip) with the lancet. Use a different finger each time.  Gently rub the finger until a small drop of blood appears.  Follow instructions that come with your meter for inserting the test strip, applying blood to the strip, and using your blood glucose meter.  Write down your result and any notes. Alternative testing sites  Some meters allow you to use areas of your body other than your finger (alternative sites) to test your blood.  If you think you may have hypoglycemia, or if you have  hypoglycemia unawareness, do not use alternative sites. Use your finger instead.  Alternative sites may not be as accurate as the fingers, because blood flow is slower in these areas. This means that the result you get may be delayed, and it may be different from the result that you would get from your finger.  The most common alternative sites are: ? Forearm. ? Thigh. ? Palm of the hand. Additional tips  Always keep your supplies with you.  If you have questions or need help, all blood glucose meters have a 24-hour hotline number that you can call. You may also contact your health care provider.  After you use a few boxes of test strips, adjust (calibrate) your blood glucose meter by following instructions that came with your meter. This information is not intended to replace advice given to you by your  health care provider. Make sure you discuss any questions you have with your health care provider. Document Released: 04/02/2003 Document Revised: 10/18/2015 Document Reviewed: 09/09/2015 Elsevier Interactive Patient Education  2017 [employer].

## 2017-09-23 NOTE — MAU Provider Note (Signed)
History     CSN: [id number]  Arrival date and time: 09/23/17 1317   First Provider Initiated Contact with Patient 09/23/17 1411      No chief complaint on file.  HPI   [name] is a 27 y.o. female GDM on insulin,  G2P0101 @ [id number] here in MAU with complaints of HA and abdominal cramping. Says her HA started last night. Says she went to a party last night and returned home around midnight. At that time she took her normal 55 units of lantus and checked her BS which was 90. She was worried because at the party she ate cake, pizza and drank soda. Prior to bed she gave her self "5-10 units of Novolog". She woke up at 1:00 am feeling lightheaded and weak. She checked her BS and it was 49. At that time she ate a honey bun, reese's peanut butter cups and drank some orange juice. She felt better and fell back asleep.  She wanted to be checked out because she was worried about the baby. The HA is around the front and around her eyes. The HA is constant. She rates her pain 3/10. She has not taken anything for the pain.   OB History    Gravida  2   Para  1   Term  0   Preterm  1   AB  0   Living  1     SAB  0   TAB  0   Ectopic  0   Multiple  0   Live Births  1           Past Medical History:  Diagnosis Date  . Anemia    as a teenager  . Chicken pox   . Diabetes mellitus    type 2 on insulin  . Impacted teeth with abnormal position    impacted wisdom teeth    Past Surgical History:  Procedure Laterality Date  . CESAREAN SECTION N/A 09/16/2015   Procedure: CESAREAN SECTION;  Surgeon: Lesly DukesKelly H Leggett, MD;  [facility];  Service: Obstetrics;  Laterality: N/A;  . FOREIGN BODY REMOVAL Left 08/28/2015   Procedure: REMOVAL FOREIGN BODY foot;  Surgeon: Dannielle HuhSteve Lucey, MD;  Location: [facility] OR;  Service: Orthopedics;  Laterality: Left;  regional block  . NO PAST SURGERIES    . TOOTH EXTRACTION N/A 07/24/2016   Procedure: EXTRACTION WISDOM TEETH ONE,  SIXTEEN, SEVENTEEN AND THIRTY-TWO;  Surgeon: Ocie DoyneScott Jensen, DDS;  Location: [facility] OR;  Service: Oral Surgery;  Laterality: N/A;    Family History  Problem Relation Age of Onset  . Stroke Father   . Hypertension Father   . Diabetes Mellitus II Father   . Irregular heart beat Father   . Heart disease Father   . Hyperlipidemia Father   . Diabetes Mellitus II Mother   . Hyperlipidemia Mother   . Thyroid disease Mother   . Hypertension Maternal Aunt   . Thyroid disease Maternal Aunt   . Diabetes Mellitus II Paternal Grandmother   . Anesthesia problems Neg Hx     Social History   Tobacco Use  . Smoking status: Never Smoker  . Smokeless tobacco: Never Used  Substance Use Topics  . Alcohol use: Not Currently    Comment: rare  . Drug use: No    Allergies:  Allergies  Allergen Reactions  . Metformin And Related Diarrhea    Facility-Administered Medications Prior to Admission  Medication Dose Route Frequency Provider Last Rate  Last Dose  . HYDROXYprogesterone Caproate SOAJ 275 mg  275 mg Subcutaneous Weekly Adam Phenix, MD   275 mg at 09/03/17 1005   Medications Prior to Admission  Medication Sig Dispense Refill Last Dose  . Insulin Glargine (LANTUS SOLOSTAR) 100 UNIT/ML Solostar Pen Inject 55 Units into the skin daily at 10 pm. 5 pen 0 09/22/2017 at 2330  . aspirin EC 81 MG tablet Take 1 tablet (81 mg total) by mouth daily. Start at 12 weeks (07/31/17) 30 tablet 11 Taking  . Prenatal Vit-Fe Fumarate-FA (PREPLUS) 27-1 MG TABS Take 1 tablet by mouth daily.   Taking  . promethazine (PHENERGAN) 25 MG tablet Take 1 tablet (25 mg total) by mouth every 6 (six) hours as needed for nausea or vomiting. (Patient not taking: Reported on 08/23/2017) 30 tablet 0 Not Taking   Results for orders placed or performed during the hospital encounter of 09/23/17 (from the past 48 hour(s))  Urinalysis, Routine w reflex microscopic     Status: None   Collection Time: 09/23/17  1:40 PM  Result Value Ref  Range   Color, Urine YELLOW YELLOW   APPearance CLEAR CLEAR   Specific Gravity, Urine 1.013 1.005 - 1.030   pH 5.0 5.0 - 8.0   Glucose, UA NEGATIVE NEGATIVE mg/dL   Hgb urine dipstick NEGATIVE NEGATIVE   Bilirubin Urine NEGATIVE NEGATIVE   Ketones, ur NEGATIVE NEGATIVE mg/dL   Protein, ur NEGATIVE NEGATIVE mg/dL   Nitrite NEGATIVE NEGATIVE   Leukocytes, UA NEGATIVE NEGATIVE    Comment: Performed at [facility], [address], [place], [place] [address]  Glucose, capillary     Status: None   Collection Time: 09/23/17  1:54 PM  Result Value Ref Range   Glucose-Capillary 90 65 - 99 mg/dL    Review of Systems  Constitutional: Negative for fever.  Gastrointestinal: Positive for abdominal pain. Negative for diarrhea, nausea and vomiting.  Genitourinary: Negative for vaginal bleeding and vaginal discharge.  Neurological: Negative for dizziness, tremors and weakness.    Physical Exam   Blood pressure 126/77, pulse 87, temperature 98.4 F (36.9 C), temperature source Oral, resp. rate 20, weight 255 lb 4 oz (115.8 kg), last menstrual period 05/08/2017, SpO2 99 %, currently breastfeeding.  Physical Exam  Constitutional: She is oriented to person, place, and time. She appears well-developed and well-nourished. No distress.  HENT:  Head: Normocephalic.  Respiratory: Effort normal.  GI: Soft. She exhibits no distension. There is no tenderness. There is no rebound and no guarding.  Musculoskeletal: Normal range of motion.  Neurological: She is alert and oriented to person, place, and time.  Skin: Skin is warm. She is not diaphoretic.  Psychiatric: Her behavior is normal.   MAU Course  Procedures  None  MDM  CBG 90 upon arrival UA collected Ibuprofen given 600 mg PO Patient rates her HA 0/10 down from 3/10, patient eating crackers and peanut butter and feels better.    Assessment and Plan   A:  1. Migraine without aura and without status migrainosus, not intractable    2. Non-compliant behavior   3. Insulin dependent diabetes mellitus (HCC)   4. [redacted] weeks gestation of pregnancy   5. Diabetes mellitus type 2, uncontrolled, without complications (HCC)    P:  Discharge home in stable condition Take your insulin as prescribed, never take extra, refill given on lantus  Lengthy discussion about importance of testing blood sugars, bringing log book and keeping appointments. Discussed risks of uncontrolled DM in pregnancy  including delayed fetal lung maturity, IUFD and shoulder dystocia possibly resulting brachial plexus palsy, brain damage, intrapartum death and extensive obstetric lacerations. Patient verbalizes understanding. Return to MAU if symptoms worsen  Rasch, Harolyn Rutherford, NP 09/23/2017 4:55 PM

## 2017-09-23 NOTE — MAU Note (Signed)
Right now her head is rocking.  Last night took her insulin, sugar wasn't bad.  During the night woke up, checked her sugar - it was 49. ( ate some reeses's, drank a sugary drink and ate a honey bun to get her sugar up). When she woke up this morning, her stomach was hurting (not as bad now), has a headache, sugar before she left home was 102. Had some juice and back when she got up.

## 2017-09-23 NOTE — MAU Provider Note (Signed)
History     CSN: [id number]  Arrival date and time: 09/23/17 1317   First Provider Initiated Contact with Patient 09/23/17 1411      26 y.o. MBF G2P0101 [redacted]w[redacted]d Intrauterine pregnancy confirmed by [place] with T2DM, presents with onset of headache, abdominal cramping, and constipation this morning. She woke up around 1:00 am this morning feeling lightheaded, and weak. Blood sugar at this time was 49. To bring her blood sugar up she ate some Reeses, a honeybun, and drank some minute maid and fell asleep. When she woke up she felt worse. She feels constipated and tried to poop, but wasn't able to. She continues to have a headache around her forehead rated a 3 out of 10 with some photophobia. Usual dose of lantus is 55 units at bedtime, but she got home late from a party and used her mother's levemir at 55 units with an additional 5 units of Novolog.   OB History    Gravida  2   Para  1   Term  0   Preterm  1   AB  0   Living  1     SAB  0   TAB  0   Ectopic  0   Multiple  0   Live Births  1           Past Medical History:  Diagnosis Date  . Anemia    as a teenager  . Chicken pox   . Diabetes mellitus    type 2 on insulin  . Impacted teeth with abnormal position    impacted wisdom teeth    Past Surgical History:  Procedure Laterality Date  . CESAREAN SECTION N/A 09/16/2015   Procedure: CESAREAN SECTION;  Surgeon: Lesly Dukes, MD;  Location: [facility] BIRTHING SUITES;  Service: Obstetrics;  Laterality: N/A;  . FOREIGN BODY REMOVAL Left 08/28/2015   Procedure: REMOVAL FOREIGN BODY foot;  Surgeon: Dannielle Huh, MD;  Location: [facility] OR;  Service: Orthopedics;  Laterality: Left;  regional block  . NO PAST SURGERIES    . TOOTH EXTRACTION N/A 07/24/2016   Procedure: EXTRACTION WISDOM TEETH ONE, SIXTEEN, SEVENTEEN AND THIRTY-TWO;  Surgeon: Ocie Doyne, DDS;  Location: [facility] OR;  Service: Oral Surgery;  Laterality: N/A;    Family History  Problem Relation Age of Onset  . Stroke  Father   . Hypertension Father   . Diabetes Mellitus II Father   . Irregular heart beat Father   . Heart disease Father   . Hyperlipidemia Father   . Diabetes Mellitus II Mother   . Hyperlipidemia Mother   . Thyroid disease Mother   . Hypertension Maternal Aunt   . Thyroid disease Maternal Aunt   . Diabetes Mellitus II Paternal Grandmother   . Anesthesia problems Neg Hx     Social History   Tobacco Use  . Smoking status: Never Smoker  . Smokeless tobacco: Never Used  Substance Use Topics  . Alcohol use: Not Currently    Comment: rare  . Drug use: No    Allergies:  Allergies  Allergen Reactions  . Metformin And Related Diarrhea    Facility-Administered Medications Prior to Admission  Medication Dose Route Frequency Provider Last Rate Last Dose  . HYDROXYprogesterone Caproate SOAJ 275 mg  275 mg Subcutaneous Weekly Adam Phenix, MD   275 mg at 09/03/17 1005   Medications Prior to Admission  Medication Sig Dispense Refill Last Dose  . Insulin Glargine (LANTUS SOLOSTAR) 100 UNIT/ML Solostar Pen Inject  55 Units into the skin daily at 10 pm. 5 pen 0 09/22/2017 at 2330  . aspirin EC 81 MG tablet Take 1 tablet (81 mg total) by mouth daily. Start at 12 weeks (07/31/17) 30 tablet 11 Taking  . Prenatal Vit-Fe Fumarate-FA (PREPLUS) 27-1 MG TABS Take 1 tablet by mouth daily.   Taking  . promethazine (PHENERGAN) 25 MG tablet Take 1 tablet (25 mg total) by mouth every 6 (six) hours as needed for nausea or vomiting. (Patient not taking: Reported on 08/23/2017) 30 tablet 0 Not Taking    Review of Systems  Constitutional: Negative.   HENT: Negative.   Eyes: Positive for photophobia. Negative for visual disturbance.  Respiratory: Negative.   Cardiovascular: Negative.   Gastrointestinal: Positive for abdominal pain and constipation. Negative for abdominal distention, diarrhea, nausea and vomiting.  Endocrine: Negative for polydipsia, polyphagia and polyuria.  Genitourinary: Negative  for difficulty urinating, flank pain, hematuria, pelvic pain, vaginal bleeding, vaginal discharge and vaginal pain.  Neurological: Positive for headaches. Negative for weakness and light-headedness.   Physical Exam   Blood pressure 126/77, pulse 87, temperature 98.4 F (36.9 C), temperature source Oral, resp. rate 20, weight 115.8 kg (255 lb 4 oz), last menstrual period 05/08/2017, SpO2 99 %, currently breastfeeding.  Physical Exam  Constitutional: She is oriented to person, place, and time. She appears well-developed and well-nourished. No distress.  HENT:  Head: Normocephalic and atraumatic.  Eyes: Right eye exhibits no discharge. Left eye exhibits no discharge. No scleral icterus.  Cardiovascular: Normal rate, regular rhythm, normal heart sounds and intact distal pulses. Exam reveals no gallop and no friction rub.  No murmur heard. Respiratory: Effort normal and breath sounds normal. No respiratory distress. She has no wheezes. She has no rales. She exhibits no tenderness.  GI: She exhibits no distension. There is tenderness. There is no rebound and no guarding.  ttp in RUQ  Neurological: She is alert and oriented to person, place, and time.  Skin: Skin is warm and dry. No rash noted. She is not diaphoretic. No erythema. No pallor.    MAU Course  Procedures  MDM UA to make sure patient is not dehydrated- normal findings CBG to check on glucose  Assessment and Plan   Headache T2DM Intrauterine pregnancy 6193w5d  -Educated patient to routinely check her blood sugars, and not give herself unprescribed insulin medication. Discussed the risks of diabetes during pregnancy and the importance of maintaining healthy blood sugar levels.  -Encouraged patient to follow-up with her pre-natal appointments, and nutritionist. -POC Ibuprofen 600 mg for headache - Refill lantus - Discharge home in stable condition -Patient should return PRN if she has new or worsening symptoms  Andrea SparkJennifer  Andrea [name] 09/23/2017, 3:15 PM

## 2017-10-01 ENCOUNTER — Encounter: Payer: Self-pay | Admitting: Obstetrics and Gynecology

## 2017-10-01 ENCOUNTER — Ambulatory Visit (INDEPENDENT_AMBULATORY_CARE_PROVIDER_SITE_OTHER): Payer: Medicaid Other | Admitting: Obstetrics and Gynecology

## 2017-10-01 VITALS — BP 112/82 | HR 108 | Wt 255.0 lb

## 2017-10-01 DIAGNOSIS — E1165 Type 2 diabetes mellitus with hyperglycemia: Secondary | ICD-10-CM

## 2017-10-01 DIAGNOSIS — O34212 Maternal care for vertical scar from previous cesarean delivery: Secondary | ICD-10-CM

## 2017-10-01 DIAGNOSIS — O34219 Maternal care for unspecified type scar from previous cesarean delivery: Secondary | ICD-10-CM

## 2017-10-01 DIAGNOSIS — IMO0001 Reserved for inherently not codable concepts without codable children: Secondary | ICD-10-CM

## 2017-10-01 DIAGNOSIS — O09899 Supervision of other high risk pregnancies, unspecified trimester: Secondary | ICD-10-CM

## 2017-10-01 DIAGNOSIS — O09212 Supervision of pregnancy with history of pre-term labor, second trimester: Secondary | ICD-10-CM

## 2017-10-01 DIAGNOSIS — O09219 Supervision of pregnancy with history of pre-term labor, unspecified trimester: Secondary | ICD-10-CM

## 2017-10-01 DIAGNOSIS — O09892 Supervision of other high risk pregnancies, second trimester: Secondary | ICD-10-CM

## 2017-10-01 LAB — POCT URINALYSIS DIP (DEVICE)
Bilirubin Urine: NEGATIVE
GLUCOSE, UA: NEGATIVE mg/dL
Hgb urine dipstick: NEGATIVE
Ketones, ur: NEGATIVE mg/dL
Nitrite: NEGATIVE
PROTEIN: NEGATIVE mg/dL
SPECIFIC GRAVITY, URINE: 1.02 (ref 1.005–1.030)
UROBILINOGEN UA: 0.2 mg/dL (ref 0.0–1.0)
pH: 6 (ref 5.0–8.0)

## 2017-10-01 MED ORDER — INSULIN GLARGINE 100 UNIT/ML SOLOSTAR PEN: 55.0000 [IU] | PEN_INJECTOR | Freq: Every day | SUBCUTANEOUS | 0 refills | Status: DC

## 2017-10-01 NOTE — Progress Notes (Signed)
Fetal Echo scheduled @ [facility] on July 22nd @ 0800 @ [phone].  [facility]  Pt notiied

## 2017-10-01 NOTE — Patient Instructions (Signed)

## 2017-10-01 NOTE — Progress Notes (Signed)
Subjective:  Andrea [name] is a 27 y.o. G2P0101 at 4332w6d being seen today for ongoing prenatal care.  She is currently monitored for the following issues for this high-risk pregnancy and has Diabetes mellitus type 2, uncontrolled, without complications (HCC); Previous cesarean delivery affecting pregnancy; BMI 40.0-44.9, adult (HCC); Healthcare maintenance; History of preterm delivery, currently pregnant; Supervision of other high risk pregnancy, antepartum; and Obesity in pregnancy on their problem list.  Patient reports no complaints.  Contractions: Not present. Vag. Bleeding: None.  Movement: Present. Denies leaking of fluid.   The following portions of the patient's history were reviewed and updated as appropriate: allergies, current medications, past family history, past medical history, past social history, past surgical history and problem list. Problem list updated.  Objective:   Vitals:   10/01/17 0911  BP: 112/82  Pulse: (!) 108  Weight: 255 lb (115.7 kg)    Fetal Status: Fetal Heart Rate (bpm): 150   Movement: Present     General:  Alert, oriented and cooperative. Patient is in no acute distress.  Skin: Skin is warm and dry. No rash noted.   Cardiovascular: Normal heart rate noted  Respiratory: Normal respiratory effort, no problems with respiration noted  Abdomen: Soft, gravid, appropriate for gestational age. Pain/Pressure: Present     Pelvic:  Cervical exam deferred        Extremities: Normal range of motion.  Edema: None  Mental Status: Normal mood and affect. Normal behavior. Normal judgment and thought content.   Urinalysis:      Assessment and Plan:  Pregnancy: G2P0101 at 1132w6d  1. Supervision of other high risk pregnancy, antepartum Stable  2. Previous cesarean delivery affecting pregnancy Will need to discuss TOLAC vs ERLTS  3. History of preterm delivery, currently pregnant Secondary to PROM at 32 weeks Continue with 17 OHP weekly  4. Diabetes  mellitus type 2, uncontrolled, without complications (HCC) Does not have CBG readings. Reports in goal range. Importance of bringing CBG readings to OB appts stressed to pt. Will continue with Lantus 55 units qd Opth and ECHO scheduled Last Hgb A1c 6.5.  Preterm labor symptoms and general obstetric precautions including but not limited to vaginal bleeding, contractions, leaking of fluid and fetal movement were reviewed in detail with the patient. Please refer to After Visit Summary for other counseling recommendations.  No follow-ups on file.   Hermina StaggersErvin, Dhyan Noah L, MD

## 2017-10-05 ENCOUNTER — Encounter (HOSPITAL_COMMUNITY): Payer: Self-pay

## 2017-10-05 ENCOUNTER — Encounter: Payer: Medicaid Other | Attending: Family Medicine | Admitting: *Deleted

## 2017-10-05 ENCOUNTER — Ambulatory Visit: Payer: Medicaid Other | Admitting: *Deleted

## 2017-10-05 ENCOUNTER — Ambulatory Visit (HOSPITAL_COMMUNITY)
Admission: RE | Admit: 2017-10-05 | Discharge: 2017-10-05 | Disposition: A | Discharge status: Home / Self Care | Payer: Medicaid Other | Source: Ambulatory Visit | Attending: Advanced Practice Midwife | Admitting: Advanced Practice Midwife

## 2017-10-05 ENCOUNTER — Other Ambulatory Visit (HOSPITAL_COMMUNITY): Payer: Self-pay | Admitting: *Deleted

## 2017-10-05 ENCOUNTER — Other Ambulatory Visit (HOSPITAL_COMMUNITY): Payer: Self-pay | Admitting: Maternal and Fetal Medicine

## 2017-10-05 DIAGNOSIS — Z713 Dietary counseling and surveillance: Secondary | ICD-10-CM | POA: Insufficient documentation

## 2017-10-05 DIAGNOSIS — O24112 Pre-existing diabetes mellitus, type 2, in pregnancy, second trimester: Secondary | ICD-10-CM

## 2017-10-05 DIAGNOSIS — O99212 Obesity complicating pregnancy, second trimester: Secondary | ICD-10-CM

## 2017-10-05 DIAGNOSIS — Z0489 Encounter for examination and observation for other specified reasons: Secondary | ICD-10-CM

## 2017-10-05 DIAGNOSIS — O09219 Supervision of pregnancy with history of pre-term labor, unspecified trimester: Secondary | ICD-10-CM

## 2017-10-05 DIAGNOSIS — O09899 Supervision of other high risk pregnancies, unspecified trimester: Secondary | ICD-10-CM

## 2017-10-05 DIAGNOSIS — Z8751 Personal history of pre-term labor: Secondary | ICD-10-CM

## 2017-10-05 DIAGNOSIS — E1165 Type 2 diabetes mellitus with hyperglycemia: Secondary | ICD-10-CM | POA: Diagnosis not present

## 2017-10-05 DIAGNOSIS — Z3A22 22 weeks gestation of pregnancy: Secondary | ICD-10-CM | POA: Diagnosis not present

## 2017-10-05 DIAGNOSIS — O24119 Pre-existing diabetes mellitus, type 2, in pregnancy, unspecified trimester: Secondary | ICD-10-CM

## 2017-10-05 DIAGNOSIS — IMO0002 Reserved for concepts with insufficient information to code with codable children: Secondary | ICD-10-CM

## 2017-10-05 NOTE — Progress Notes (Signed)
Patient was seen on 10/06/2017 for type 2 Diabetes and pregnancy self-management follow up visit. EDD 02/12/2018. She is here with her husband / SO, who she complemented as being supportive with her diabetes and pregnancy. Patient states she feels she is doing well with carb counting and understands it better now. She states her Log Sheet was destroyed by her child, so she started a new one yesterday. I informed her of [employer], which she is very interested in, so we signed her up today.  Patient states history of this diabetes for almost 10 years.  Diet history obtained. Patient eats good variety of all food groups and beverages include coffee, water, flavored waters and some juice  Her comfort level with carb counting is 9/10. Patient is currently on 55 units of Lantus insulin at night for her diabetes medications.  The following learning objectives were met by the patient :   She states her FBG are under 100 mg/dl and post meal BG are under 120 mg/dl most of the time. She is excited about putting BG data into her phone with [employer] now.   Plan:  Continue to:  Aim for 3 Carb Choices per meal (45 grams) +/- 1 either way   Aim for 1-2 Carbs per snack  Begin reading food labels for Total Carbohydrate of foods  Consider  increasing your activity level by walking or other activity daily as tolerated  Checking BG before breakfast and 2 hours after first bite of breakfast, lunch and dinner as directed by MD   Bring Log Book/Sheet to every medical appointment  Unless using [employer].   Take medication as directed by MD  Patient instructed to monitor glucose levels: FBS: 60 - 95 mg/dl 2 hour: <120 mg/dl  Patient received the following handouts:  No new handouts today  Patient will be seen for follow-up in 1 month .

## 2017-10-24 ENCOUNTER — Inpatient Hospital Stay (HOSPITAL_COMMUNITY)
Admission: AD | Admit: 2017-10-24 | Discharge: 2017-10-24 | Disposition: A | Discharge status: Home / Self Care | Payer: Medicaid Other | Source: Ambulatory Visit | Attending: Obstetrics & Gynecology | Admitting: Obstetrics & Gynecology

## 2017-10-24 ENCOUNTER — Encounter (HOSPITAL_COMMUNITY): Payer: Self-pay | Admitting: *Deleted

## 2017-10-24 DIAGNOSIS — Z794 Long term (current) use of insulin: Secondary | ICD-10-CM | POA: Diagnosis not present

## 2017-10-24 DIAGNOSIS — Z823 Family history of stroke: Secondary | ICD-10-CM | POA: Diagnosis not present

## 2017-10-24 DIAGNOSIS — Z888 Allergy status to other drugs, medicaments and biological substances status: Secondary | ICD-10-CM | POA: Diagnosis not present

## 2017-10-24 DIAGNOSIS — Z3A24 24 weeks gestation of pregnancy: Secondary | ICD-10-CM | POA: Diagnosis not present

## 2017-10-24 DIAGNOSIS — R102 Pelvic and perineal pain: Secondary | ICD-10-CM | POA: Diagnosis not present

## 2017-10-24 DIAGNOSIS — E119 Type 2 diabetes mellitus without complications: Secondary | ICD-10-CM | POA: Insufficient documentation

## 2017-10-24 DIAGNOSIS — Z9889 Other specified postprocedural states: Secondary | ICD-10-CM | POA: Insufficient documentation

## 2017-10-24 DIAGNOSIS — R109 Unspecified abdominal pain: Secondary | ICD-10-CM | POA: Diagnosis present

## 2017-10-24 DIAGNOSIS — O26892 Other specified pregnancy related conditions, second trimester: Secondary | ICD-10-CM | POA: Insufficient documentation

## 2017-10-24 DIAGNOSIS — O26899 Other specified pregnancy related conditions, unspecified trimester: Secondary | ICD-10-CM

## 2017-10-24 DIAGNOSIS — Z8349 Family history of other endocrine, nutritional and metabolic diseases: Secondary | ICD-10-CM | POA: Insufficient documentation

## 2017-10-24 DIAGNOSIS — Z8249 Family history of ischemic heart disease and other diseases of the circulatory system: Secondary | ICD-10-CM | POA: Insufficient documentation

## 2017-10-24 DIAGNOSIS — Z833 Family history of diabetes mellitus: Secondary | ICD-10-CM | POA: Diagnosis not present

## 2017-10-24 LAB — URINALYSIS, ROUTINE W REFLEX MICROSCOPIC
BILIRUBIN URINE: NEGATIVE
Glucose, UA: NEGATIVE mg/dL
HGB URINE DIPSTICK: NEGATIVE
KETONES UR: NEGATIVE mg/dL
Leukocytes, UA: NEGATIVE
NITRITE: NEGATIVE
PROTEIN: NEGATIVE mg/dL
SPECIFIC GRAVITY, URINE: 1.017 (ref 1.005–1.030)
pH: 5 (ref 5.0–8.0)

## 2017-10-24 MED ORDER — COMFORT FIT MATERNITY SUPP MED MISC
1.0000 | Freq: Every day | 0 refills | Status: DC
Start: 2017-10-24 — End: 2018-02-02

## 2017-10-24 MED ORDER — COMFORT FIT MATERNITY SUPP MED MISC
1.0000 | Freq: Every day | 0 refills | Status: DC
Start: 2017-10-24 — End: 2017-10-24

## 2017-10-24 MED ORDER — CYCLOBENZAPRINE HCL 10 MG PO TABS
10.0000 mg | ORAL_TABLET | Freq: Once | ORAL | Status: AC
  Administered 2017-10-24: 10 mg via ORAL
  Filled 2017-10-24: qty 1

## 2017-10-24 MED ORDER — CYCLOBENZAPRINE HCL 10 MG PO TABS: 10.0000 mg | ORAL_TABLET | Freq: Once | ORAL | 0 refills | Status: AC

## 2017-10-24 NOTE — MAU Provider Note (Signed)
Chief Complaint:  Abdominal Pain   First Provider Initiated Contact with Patient 10/24/17 0140      HPI: Andrea [name] is a 27 y.o. G2P0101 at 881w1d who presents to maternity admissions reporting bilateral pain in both groin areas starting last night.  She reports the pain is sharp, bilaterally in her lower abdomen/groin area and radiates to her low back. The pain started after pushing a heavy cart in the grocery store today. She thinks she may have pulled a muscle.  The pain is worse with walking, getting out of the car, and changing positions in bed. She has not tried any treatments.  There are no other associated symptoms. She reports good fetal movement, denies LOF, vaginal bleeding, vaginal itching/burning, urinary symptoms, h/a, dizziness, n/v, or fever/chills.    HPI  Past Medical History: Past Medical History:  Diagnosis Date  . Anemia    as a teenager  . Chicken pox   . Diabetes mellitus    type 2 on insulin  . Impacted teeth with abnormal position    impacted wisdom teeth    Past obstetric history: OB History  Gravida Para Term Preterm AB Living  2 1 0 1 0 1  SAB TAB Ectopic Multiple Live Births  0 0 0 0 1    # Outcome Date GA Lbr Len/2nd Weight Sex Delivery Anes PTL Lv  2 Current           1 Preterm 09/16/15 7219w1d  4 lb 4.8 oz (1.95 kg) M CS-LTranv EPI  LIV    Past Surgical History: Past Surgical History:  Procedure Laterality Date  . CESAREAN SECTION N/A 09/16/2015   Procedure: CESAREAN SECTION;  Surgeon: Lesly DukesKelly H Leggett, MD;  [facility];  Service: Obstetrics;  Laterality: N/A;  . FOREIGN BODY REMOVAL Left 08/28/2015   Procedure: REMOVAL FOREIGN BODY foot;  Surgeon: Dannielle HuhSteve Lucey, MD;  Location: [facility] OR;  Service: Orthopedics;  Laterality: Left;  regional block  . NO PAST SURGERIES    . TOOTH EXTRACTION N/A 07/24/2016   Procedure: EXTRACTION WISDOM TEETH ONE, SIXTEEN, SEVENTEEN AND THIRTY-TWO;  Surgeon: Ocie DoyneScott Jensen, DDS;  Location: [facility] OR;  Service:  Oral Surgery;  Laterality: N/A;    Family History: Family History  Problem Relation Age of Onset  . Stroke Father   . Hypertension Father   . Diabetes Mellitus II Father   . Irregular heart beat Father   . Heart disease Father   . Hyperlipidemia Father   . Diabetes Mellitus II Mother   . Hyperlipidemia Mother   . Thyroid disease Mother   . Hypertension Maternal Aunt   . Thyroid disease Maternal Aunt   . Diabetes Mellitus II Paternal Grandmother   . Anesthesia problems Neg Hx     Social History: Social History   Tobacco Use  . Smoking status: Never Smoker  . Smokeless tobacco: Never Used  Substance Use Topics  . Alcohol use: Not Currently    Comment: rare  . Drug use: No    Allergies:  Allergies  Allergen Reactions  . Metformin And Related Diarrhea    Meds:  No medications prior to admission.    ROS:  Review of Systems  Constitutional: Negative for chills, fatigue and fever.  Eyes: Negative for visual disturbance.  Respiratory: Negative for shortness of breath.   Cardiovascular: Negative for chest pain.  Gastrointestinal: Positive for abdominal pain. Negative for nausea and vomiting.  Genitourinary: Positive for pelvic pain. Negative for difficulty urinating, dysuria, flank  pain, vaginal bleeding, vaginal discharge and vaginal pain.  Musculoskeletal: Positive for back pain.  Neurological: Negative for dizziness and headaches.  Psychiatric/Behavioral: Negative.      I have reviewed patient's Past Medical Hx, Surgical Hx, Family Hx, Social Hx, medications and allergies.   Physical Exam   Patient Vitals for the past 24 hrs:  BP Temp Pulse Resp SpO2 Height Weight  10/24/17 0059 134/66 97.6 F (36.4 C) 87 19 100 % 5\' 2"  (1.575 m) 257 lb 4 oz (116.7 kg)   Constitutional: Well-developed, well-nourished female in no acute distress.  Cardiovascular: normal rate Respiratory: normal effort GI: Abd soft, non-tender, gravid appropriate for gestational age.  MS:  Extremities nontender, no edema, normal ROM Neurologic: Alert and oriented x 4.  GU: Neg CVAT.   Dilation: Closed Effacement (%): Thick Cervical Position: Posterior Exam by:: L.Leftwich-Kirby,CNM  FHT:  Baseline 150, moderate variability, accelerations present, no decelerations Contractions: None on toco or to palpation   Labs: Results for orders placed or performed during the hospital encounter of 10/24/17 (from the past 24 hour(s))  Urinalysis, Routine w reflex microscopic     Status: Abnormal   Collection Time: 10/24/17  2:03 AM  Result Value Ref Range   Color, Urine YELLOW YELLOW   APPearance HAZY (A) CLEAR   Specific Gravity, Urine 1.017 1.005 - 1.030   pH 5.0 5.0 - 8.0   Glucose, UA NEGATIVE NEGATIVE mg/dL   Hgb urine dipstick NEGATIVE NEGATIVE   Bilirubin Urine NEGATIVE NEGATIVE   Ketones, ur NEGATIVE NEGATIVE mg/dL   Protein, ur NEGATIVE NEGATIVE mg/dL   Nitrite NEGATIVE NEGATIVE   Leukocytes, UA NEGATIVE NEGATIVE   B/Positive/-- (04/15 1540)  Imaging:    MAU Course/MDM: I have ordered labs and reviewed results.  NST reviewed and reactive UA without evidence of UTI or dehydration No evidence of preterm labor with closed cervix and without measurable contractions Pain most c/w round ligament pain, possibly increased due to pt activity yesterday Rest/ice/heat/Tylenol/pregnancy support belt for pain Flexeril 10 mg PO x 1 dose in MAU and Rx for 10 more tablets F/U in office as scheduled Pt discharge with strict preterm labor/return precautions.  Today's evaluation included a work-up for preterm labor which can be life-threatening for both mom and baby.  Assessment: 1. Pain of round ligament affecting pregnancy, antepartum   2. Abdominal pain during pregnancy in second trimester     Plan: Discharge home Labor precautions and fetal kick counts  Allergies as of 10/24/2017      Reactions   Metformin And Related Diarrhea      Medication List    TAKE these  medications   aspirin EC 81 MG tablet Take 1 tablet (81 mg total) by mouth daily. Start at 12 weeks (07/31/17)   COMFORT FIT MATERNITY SUPP MED Misc 1 Device by Does not apply route daily.   cyclobenzaprine 10 MG tablet Commonly known as:  FLEXERIL Take 1 tablet (10 mg total) by mouth once for 1 dose.   Insulin Glargine 100 UNIT/ML Solostar Pen Commonly known as:  LANTUS SOLOSTAR Inject 55 Units into the skin daily at 10 pm.   PREPLUS 27-1 MG Tabs Take 1 tablet by mouth daily.   STOOL SOFTENER PO Take by mouth.       Sharen Counter Certified Nurse-Midwife 10/24/2017 3:37 AM

## 2017-10-24 NOTE — MAU Note (Signed)
Groin pain that is constant and has been occurring since she went shopping earlier.  No LOF/VB.  +FM.  States she feels like she pulled a muscle.  Has not taken anything for the pain.

## 2017-10-27 ENCOUNTER — Encounter: Payer: Self-pay | Admitting: Obstetrics and Gynecology

## 2017-10-27 ENCOUNTER — Ambulatory Visit (INDEPENDENT_AMBULATORY_CARE_PROVIDER_SITE_OTHER): Payer: Medicaid Other | Admitting: Obstetrics and Gynecology

## 2017-10-27 VITALS — BP 117/72 | HR 91 | Wt 253.8 lb

## 2017-10-27 DIAGNOSIS — O34219 Maternal care for unspecified type scar from previous cesarean delivery: Secondary | ICD-10-CM

## 2017-10-27 DIAGNOSIS — O09899 Supervision of other high risk pregnancies, unspecified trimester: Secondary | ICD-10-CM

## 2017-10-27 DIAGNOSIS — E1165 Type 2 diabetes mellitus with hyperglycemia: Secondary | ICD-10-CM

## 2017-10-27 DIAGNOSIS — IMO0001 Reserved for inherently not codable concepts without codable children: Secondary | ICD-10-CM

## 2017-10-27 DIAGNOSIS — O09219 Supervision of pregnancy with history of pre-term labor, unspecified trimester: Secondary | ICD-10-CM

## 2017-10-27 MED ORDER — HYDROXYPROGESTERONE CAPROATE 275 MG/1.1ML ~~LOC~~ SOAJ
275.0000 mg | Freq: Once | SUBCUTANEOUS | Status: AC
  Administered 2017-10-27: 275 mg via SUBCUTANEOUS

## 2017-10-27 NOTE — Patient Instructions (Signed)

## 2017-10-27 NOTE — Progress Notes (Signed)
Subjective:  Andrea [name] is a 27 y.o. G2P0101 at 9256w4d being seen today for ongoing prenatal care.  She is currently monitored for the following issues for this high-risk pregnancy and has Diabetes mellitus type 2, uncontrolled, without complications (HCC); Previous cesarean delivery affecting pregnancy; BMI 40.0-44.9, adult (HCC); Healthcare maintenance; History of preterm delivery, currently pregnant; Supervision of other high risk pregnancy, antepartum; and Obesity in pregnancy on their problem list.  Patient reports no complaints.  Contractions: Not present. Vag. Bleeding: None.  Movement: Present. Denies leaking of fluid.   The following portions of the patient's history were reviewed and updated as appropriate: allergies, current medications, past family history, past medical history, past social history, past surgical history and problem list. Problem list updated.  Objective:   Vitals:   10/27/17 1408  BP: 117/72  Pulse: 91  Weight: 253 lb 12.8 oz (115.1 kg)    Fetal Status: Fetal Heart Rate (bpm): 167   Movement: Present     General:  Alert, oriented and cooperative. Patient is in no acute distress.  Skin: Skin is warm and dry. No rash noted.   Cardiovascular: Normal heart rate noted  Respiratory: Normal respiratory effort, no problems with respiration noted  Abdomen: Soft, gravid, appropriate for gestational age. Pain/Pressure: Absent     Pelvic:  Cervical exam deferred        Extremities: Normal range of motion.  Edema: None  Mental Status: Normal mood and affect. Normal behavior. Normal judgment and thought content.   Urinalysis:      Assessment and Plan:  Pregnancy: G2P0101 at 2356w4d  1. Supervision of other high risk pregnancy, antepartum Stable  2. History of preterm delivery, currently pregnant Stable Continue with weekly 17 OHP - HYDROXYprogesterone Caproate SOAJ 275 mg  3. Previous cesarean delivery affecting pregnancy To discuss TOLAC vs ERLTCS at later  appts  4. Diabetes mellitus type 2, uncontrolled, without complications (HCC) CBG's in goal range for most part. Outliers related to diet choices. Will continue with Lantus 55 units qHS Opth completed and good ECHO next week  F/U growth scan next week  Preterm labor symptoms and general obstetric precautions including but not limited to vaginal bleeding, contractions, leaking of fluid and fetal movement were reviewed in detail with the patient. Please refer to After Visit Summary for other counseling recommendations.  No follow-ups on file.   Hermina StaggersErvin, Trasean Delima L, MD

## 2017-11-02 ENCOUNTER — Ambulatory Visit (HOSPITAL_COMMUNITY)
Admission: RE | Admit: 2017-11-02 | Discharge: 2017-11-02 | Disposition: A | Discharge status: Home / Self Care | Payer: Medicaid Other | Source: Ambulatory Visit | Attending: Advanced Practice Midwife | Admitting: Advanced Practice Midwife

## 2017-11-02 ENCOUNTER — Other Ambulatory Visit (HOSPITAL_COMMUNITY): Payer: Self-pay | Admitting: Obstetrics and Gynecology

## 2017-11-02 ENCOUNTER — Other Ambulatory Visit: Payer: Self-pay | Admitting: General Practice

## 2017-11-02 ENCOUNTER — Encounter (HOSPITAL_COMMUNITY): Payer: Self-pay

## 2017-11-02 DIAGNOSIS — IMO0002 Reserved for concepts with insufficient information to code with codable children: Secondary | ICD-10-CM

## 2017-11-02 DIAGNOSIS — O24119 Pre-existing diabetes mellitus, type 2, in pregnancy, unspecified trimester: Secondary | ICD-10-CM

## 2017-11-02 DIAGNOSIS — O99212 Obesity complicating pregnancy, second trimester: Secondary | ICD-10-CM

## 2017-11-02 DIAGNOSIS — Z3A25 25 weeks gestation of pregnancy: Secondary | ICD-10-CM

## 2017-11-02 DIAGNOSIS — O24112 Pre-existing diabetes mellitus, type 2, in pregnancy, second trimester: Secondary | ICD-10-CM | POA: Diagnosis not present

## 2017-11-02 DIAGNOSIS — O09219 Supervision of pregnancy with history of pre-term labor, unspecified trimester: Secondary | ICD-10-CM

## 2017-11-02 DIAGNOSIS — IMO0001 Reserved for inherently not codable concepts without codable children: Secondary | ICD-10-CM

## 2017-11-02 DIAGNOSIS — Z0489 Encounter for examination and observation for other specified reasons: Secondary | ICD-10-CM

## 2017-11-02 DIAGNOSIS — O09899 Supervision of other high risk pregnancies, unspecified trimester: Secondary | ICD-10-CM

## 2017-11-02 DIAGNOSIS — E1165 Type 2 diabetes mellitus with hyperglycemia: Principal | ICD-10-CM

## 2017-11-02 MED ORDER — INSULIN GLARGINE 100 UNIT/ML SOLOSTAR PEN: 55.0000 [IU] | PEN_INJECTOR | Freq: Every day | SUBCUTANEOUS | 0 refills | Status: DC

## 2017-11-03 ENCOUNTER — Other Ambulatory Visit (HOSPITAL_COMMUNITY): Payer: Self-pay | Admitting: *Deleted

## 2017-11-03 DIAGNOSIS — O24119 Pre-existing diabetes mellitus, type 2, in pregnancy, unspecified trimester: Secondary | ICD-10-CM

## 2017-11-03 DIAGNOSIS — Z794 Long term (current) use of insulin: Principal | ICD-10-CM

## 2017-11-04 ENCOUNTER — Other Ambulatory Visit: Payer: Medicaid Other

## 2017-11-24 ENCOUNTER — Ambulatory Visit (INDEPENDENT_AMBULATORY_CARE_PROVIDER_SITE_OTHER): Payer: Medicaid Other | Admitting: Obstetrics & Gynecology

## 2017-11-24 ENCOUNTER — Encounter: Payer: Self-pay | Admitting: Obstetrics & Gynecology

## 2017-11-24 ENCOUNTER — Encounter: Payer: Self-pay | Admitting: *Deleted

## 2017-11-24 VITALS — BP 121/88 | HR 89 | Wt 258.1 lb

## 2017-11-24 DIAGNOSIS — O09219 Supervision of pregnancy with history of pre-term labor, unspecified trimester: Secondary | ICD-10-CM | POA: Diagnosis not present

## 2017-11-24 DIAGNOSIS — O34219 Maternal care for unspecified type scar from previous cesarean delivery: Secondary | ICD-10-CM | POA: Diagnosis not present

## 2017-11-24 DIAGNOSIS — O24313 Unspecified pre-existing diabetes mellitus in pregnancy, third trimester: Secondary | ICD-10-CM | POA: Diagnosis present

## 2017-11-24 DIAGNOSIS — O09899 Supervision of other high risk pregnancies, unspecified trimester: Secondary | ICD-10-CM | POA: Diagnosis not present

## 2017-11-24 LAB — POCT URINALYSIS DIP (DEVICE)
BILIRUBIN URINE: NEGATIVE
GLUCOSE, UA: NEGATIVE mg/dL
Hgb urine dipstick: NEGATIVE
KETONES UR: NEGATIVE mg/dL
Nitrite: NEGATIVE
PROTEIN: NEGATIVE mg/dL
SPECIFIC GRAVITY, URINE: 1.02 (ref 1.005–1.030)
Urobilinogen, UA: 0.2 mg/dL (ref 0.0–1.0)
pH: 6 (ref 5.0–8.0)

## 2017-11-24 NOTE — Progress Notes (Signed)
   PRENATAL VISIT NOTE  Subjective:  Andrea [name] is a 27 y.o. G2P0101 at 5446w4d being seen today for ongoing prenatal care.  She is currently monitored for the following issues for this high-risk pregnancy and has Diabetes mellitus type 2, uncontrolled, without complications (HCC); Preexisting diabetes complicating pregnancy, antepartum; Previous cesarean delivery affecting pregnancy; BMI 40.0-44.9, adult (HCC); Healthcare maintenance; History of preterm delivery, currently pregnant; Supervision of other high risk pregnancy, antepartum; and Obesity in pregnancy on their problem list.  Patient reports no complaints.  Recently lost her father,coping appropriately. Declines to talk to counselor today.  Contractions: Not present. Vag. Bleeding: None.  Movement: Present. Denies leaking of fluid.   The following portions of the patient's history were reviewed and updated as appropriate: allergies, current medications, past family history, past medical history, past social history, past surgical history and problem list. Problem list updated.  Objective:   Vitals:   11/24/17 1433  BP: 121/88  Pulse: 89  Weight: 258 lb 1.6 oz (117.1 kg)    Fetal Status: Fetal Heart Rate (bpm): 147 Fundal Height: 28 cm Movement: Present     General:  Alert, oriented and cooperative. Patient is in no acute distress.  Skin: Skin is warm and dry. No rash noted.   Cardiovascular: Normal heart rate noted  Respiratory: Normal respiratory effort, no problems with respiration noted  Abdomen: Soft, gravid, appropriate for gestational age.  Pain/Pressure: Present     Pelvic: Cervical exam deferred        Extremities: Normal range of motion.  Edema: None  Mental Status: Normal mood and affect. Normal behavior. Normal judgment and thought content.   Assessment and Plan:  Pregnancy: G2P0101 at 2146w4d  1. Preexisting diabetes complicating pregnancy in third trimester, antepartum Has not checked BS recently due to loss  of father, does well - Hemoglobin A1c  2. Previous cesarean delivery affecting pregnancy Counseled regarding TOLAC vs RCS; risks/benefits discussed in detail. All questions answered.  Patient elects for TOLAC, consent signed 11/24/2017.  3. History of preterm delivery, currently pregnant Continue weekly 17P.  4. Supervision of other high risk pregnancy, antepartum Third trimester labs done today, will follow up results and manage accordingly. - CBC - RPR - HIV antibody Preterm labor symptoms and general obstetric precautions including but not limited to vaginal bleeding, contractions, leaking of fluid and fetal movement were reviewed in detail with the patient. Please refer to After Visit Summary for other counseling recommendations.  Return in about 1 week (around 12/01/2017) for 17P only 2 weeks: 17P and OB visit (HOB).  Future Appointments  Date Time Provider Department Center  11/30/2017  2:00 PM [facility] US 3 [facility]    Jaynie CollinsUgonna Akeylah Hendel, MD

## 2017-11-24 NOTE — Patient Instructions (Signed)
Trial of Labor After Cesarean Delivery A trial of labor after cesarean delivery (TOLAC) is when a woman tries to give birth vaginally after a previous cesarean delivery. TOLAC may be a safe and appropriate option for you depending on your medical history and other risk factors. When TOLAC is successful and you are able to have a vaginal delivery, this is called a vaginal birth after cesarean delivery (VBAC). Candidates for TOLAC TOLAC is possible for some women who:  Have undergone one or two prior cesarean deliveries in which the incision of the uterus was horizontal (low transverse).  Are carrying twins and have had one prior low transverse incision during a cesarean delivery.  Do not have a vertical (classical) uterine scar.  Have not had a tear in the wall of their uterus (uterine rupture).  TOLAC is also supported for women who meet appropriate criteria and:  Are under the age of 40 years.  Are tall and have a body mass index (BMI) of less than 30.  Have an unknown uterine scar.  Give birth in a facility equipped to handle an emergency cesarean delivery. This team should be able to handle possible complications such as a uterine rupture.  Have thorough counseling about the benefits and risks of TOLAC.  Have discussed future pregnancy plans with their health care provider.  Plan to have several more pregnancies.  Most successful candidates for TOLAC:  Have had a successful vaginal delivery before or after their cesarean delivery.  Experience labor that begins naturally on or before the due date (40 weeks of gestation).  Do not have a very large (macrosomic) baby.  Had a prior cesarean delivery but are not currently experiencing factors that would prompt a cesarean delivery (such as a breech position).  Had only one prior cesarean delivery.  Had a prior cesarean delivery that was performed early in labor and not after full cervical dilation. TOLAC may be most appropriate  for women who meet the above guidelines and who plan to have more pregnancies. TOLAC is not recommended for home births. Least successful candidates for TOLAC:  Have an induced labor with an unfavorable cervix. An unfavorable cervix is when the cervix is not dilating enough (among other factors).  Have never had a vaginal delivery.  Have had more than two cesarean deliveries.  Have a pregnancy at more than 40 weeks of gestation.  Are pregnant with a baby with a suspected weight greater than 4,000 grams (8 pounds) and who have no prior history of a vaginal delivery.  Have closely spaced pregnancies. Suggested benefits of TOLAC  You may have a faster recovery time.  You may have a shorter stay in the hospital.  You may have less pain and fewer problems than with a cesarean delivery. Women who have a cesarean delivery have a higher chance of needing blood or getting a fever, an infection, or a blood clot in the legs. Suggested risks of TOLAC The highest risk of complications happens to women who attempt a TOLAC and fail. A failed TOLAC results in an unplanned cesarean delivery. Risks related to TOLAC or repeat cesarean deliveries include:  Blood loss.  Infection.  Blood clot.  Injury to surrounding tissues or organs.  Having to remove the uterus (hysterectomy).  Potential problems with the placenta (such as placenta previa or placenta accreta) in future pregnancies.  Although very rare, the main concerns with TOLAC are:  Rupture of the uterine scar from a past cesarean delivery.  Needing an emergency   cesarean delivery.  Having a bad outcome for the baby (perinatal morbidity).  Where to find more information:  American Congress of Obstetricians and Gynecologists: www.acog.org  American College of Nurse-Midwives: www.midwife.org This information is not intended to replace advice given to you by your health care provider. Make sure you discuss any questions you have with  your health care provider. Document Released: 12/16/2010 Document Revised: 02/26/2016 Document Reviewed: 09/19/2012 Elsevier Interactive Patient Education  2018 Elsevier Inc.  

## 2017-11-24 NOTE — Progress Notes (Signed)
Father passed away last week. C/o think she pulled a muscle when she leaped up when hospice called her about Dad passing.  Declines tdap. States did not know she should be getting 17p weekly, will make weekly appointment today.

## 2017-11-25 LAB — HEMOGLOBIN A1C
Est. average glucose Bld gHb Est-mCnc: 126 mg/dL
Hgb A1c MFr Bld: 6 % — ABNORMAL HIGH (ref 4.8–5.6)

## 2017-11-25 LAB — HIV ANTIBODY (ROUTINE TESTING W REFLEX): HIV SCREEN 4TH GENERATION: NONREACTIVE

## 2017-11-25 LAB — CBC
HEMATOCRIT: 31.8 % — AB (ref 34.0–46.6)
Hemoglobin: 10.5 g/dL — ABNORMAL LOW (ref 11.1–15.9)
MCH: 25.1 pg — ABNORMAL LOW (ref 26.6–33.0)
MCHC: 33 g/dL (ref 31.5–35.7)
MCV: 76 fL — ABNORMAL LOW (ref 79–97)
PLATELETS: 361 10*3/uL (ref 150–450)
RBC: 4.18 x10E6/uL (ref 3.77–5.28)
RDW: 16.9 % — AB (ref 12.3–15.4)
WBC: 6.6 10*3/uL (ref 3.4–10.8)

## 2017-11-25 LAB — RPR: RPR Ser Ql: NONREACTIVE

## 2017-11-30 ENCOUNTER — Ambulatory Visit (HOSPITAL_COMMUNITY)
Admission: RE | Admit: 2017-11-30 | Discharge: 2017-11-30 | Disposition: A | Discharge status: Home / Self Care | Payer: Medicaid Other | Source: Ambulatory Visit | Attending: Advanced Practice Midwife | Admitting: Advanced Practice Midwife

## 2017-11-30 ENCOUNTER — Other Ambulatory Visit (HOSPITAL_COMMUNITY): Payer: Self-pay | Admitting: *Deleted

## 2017-11-30 ENCOUNTER — Encounter (HOSPITAL_COMMUNITY): Payer: Self-pay

## 2017-11-30 DIAGNOSIS — O99213 Obesity complicating pregnancy, third trimester: Secondary | ICD-10-CM | POA: Insufficient documentation

## 2017-11-30 DIAGNOSIS — O34219 Maternal care for unspecified type scar from previous cesarean delivery: Secondary | ICD-10-CM | POA: Insufficient documentation

## 2017-11-30 DIAGNOSIS — E669 Obesity, unspecified: Secondary | ICD-10-CM | POA: Insufficient documentation

## 2017-11-30 DIAGNOSIS — Z3A29 29 weeks gestation of pregnancy: Secondary | ICD-10-CM | POA: Insufficient documentation

## 2017-11-30 DIAGNOSIS — O24119 Pre-existing diabetes mellitus, type 2, in pregnancy, unspecified trimester: Secondary | ICD-10-CM

## 2017-11-30 DIAGNOSIS — O09219 Supervision of pregnancy with history of pre-term labor, unspecified trimester: Secondary | ICD-10-CM

## 2017-11-30 DIAGNOSIS — O24113 Pre-existing diabetes mellitus, type 2, in pregnancy, third trimester: Secondary | ICD-10-CM

## 2017-11-30 DIAGNOSIS — O09213 Supervision of pregnancy with history of pre-term labor, third trimester: Secondary | ICD-10-CM | POA: Insufficient documentation

## 2017-11-30 DIAGNOSIS — Z794 Long term (current) use of insulin: Secondary | ICD-10-CM | POA: Insufficient documentation

## 2017-11-30 DIAGNOSIS — O09899 Supervision of other high risk pregnancies, unspecified trimester: Secondary | ICD-10-CM

## 2017-12-01 ENCOUNTER — Ambulatory Visit: Payer: Medicaid Other

## 2017-12-01 ENCOUNTER — Encounter: Payer: Self-pay | Admitting: *Deleted

## 2017-12-09 ENCOUNTER — Other Ambulatory Visit: Payer: Self-pay | Admitting: Family Medicine

## 2017-12-09 ENCOUNTER — Other Ambulatory Visit: Payer: Self-pay | Admitting: Obstetrics and Gynecology

## 2017-12-09 DIAGNOSIS — E1165 Type 2 diabetes mellitus with hyperglycemia: Principal | ICD-10-CM

## 2017-12-09 DIAGNOSIS — IMO0001 Reserved for inherently not codable concepts without codable children: Secondary | ICD-10-CM

## 2017-12-09 NOTE — Telephone Encounter (Signed)
Patient called to say she needed her insulin refilled.

## 2017-12-10 MED ORDER — INSULIN GLARGINE 100 UNIT/ML SOLOSTAR PEN: 55.0000 [IU] | PEN_INJECTOR | Freq: Every day | SUBCUTANEOUS | 0 refills | Status: DC

## 2017-12-15 ENCOUNTER — Encounter: Payer: Medicaid Other | Admitting: Obstetrics and Gynecology

## 2017-12-17 ENCOUNTER — Encounter: Payer: Self-pay | Admitting: *Deleted

## 2017-12-21 ENCOUNTER — Ambulatory Visit (HOSPITAL_COMMUNITY)
Admission: RE | Admit: 2017-12-21 | Discharge: 2017-12-21 | Disposition: A | Discharge status: Home / Self Care | Payer: Medicaid Other | Source: Ambulatory Visit | Attending: Advanced Practice Midwife | Admitting: Advanced Practice Midwife

## 2017-12-21 ENCOUNTER — Encounter (HOSPITAL_COMMUNITY): Payer: Self-pay

## 2017-12-21 DIAGNOSIS — Z362 Encounter for other antenatal screening follow-up: Secondary | ICD-10-CM | POA: Diagnosis not present

## 2017-12-21 DIAGNOSIS — O24113 Pre-existing diabetes mellitus, type 2, in pregnancy, third trimester: Secondary | ICD-10-CM | POA: Diagnosis not present

## 2017-12-21 DIAGNOSIS — Z3A32 32 weeks gestation of pregnancy: Secondary | ICD-10-CM | POA: Insufficient documentation

## 2017-12-21 DIAGNOSIS — O09899 Supervision of other high risk pregnancies, unspecified trimester: Secondary | ICD-10-CM

## 2017-12-21 DIAGNOSIS — O24119 Pre-existing diabetes mellitus, type 2, in pregnancy, unspecified trimester: Secondary | ICD-10-CM

## 2017-12-21 DIAGNOSIS — O09219 Supervision of pregnancy with history of pre-term labor, unspecified trimester: Secondary | ICD-10-CM

## 2017-12-21 DIAGNOSIS — O99213 Obesity complicating pregnancy, third trimester: Secondary | ICD-10-CM

## 2017-12-23 ENCOUNTER — Encounter: Payer: Medicaid Other | Admitting: Family Medicine

## 2017-12-27 ENCOUNTER — Ambulatory Visit (HOSPITAL_COMMUNITY): Admission: RE | Admit: 2017-12-27 | Payer: Medicaid Other | Source: Ambulatory Visit

## 2017-12-30 ENCOUNTER — Ambulatory Visit (INDEPENDENT_AMBULATORY_CARE_PROVIDER_SITE_OTHER): Payer: Medicaid Other | Admitting: Family Medicine

## 2017-12-30 ENCOUNTER — Encounter (HOSPITAL_COMMUNITY): Payer: Self-pay

## 2017-12-30 ENCOUNTER — Inpatient Hospital Stay (HOSPITAL_BASED_OUTPATIENT_CLINIC_OR_DEPARTMENT_OTHER): Payer: Medicaid Other

## 2017-12-30 ENCOUNTER — Inpatient Hospital Stay (HOSPITAL_COMMUNITY)
Admission: AD | Admit: 2017-12-30 | Discharge: 2017-12-30 | Disposition: A | Discharge status: Home / Self Care | Payer: Medicaid Other | Source: Ambulatory Visit | Attending: Obstetrics and Gynecology | Admitting: Obstetrics and Gynecology

## 2017-12-30 VITALS — BP 134/66 | HR 88 | Wt 257.0 lb

## 2017-12-30 DIAGNOSIS — M25462 Effusion, left knee: Secondary | ICD-10-CM | POA: Insufficient documentation

## 2017-12-30 DIAGNOSIS — E1165 Type 2 diabetes mellitus with hyperglycemia: Secondary | ICD-10-CM | POA: Diagnosis not present

## 2017-12-30 DIAGNOSIS — IMO0001 Reserved for inherently not codable concepts without codable children: Secondary | ICD-10-CM

## 2017-12-30 DIAGNOSIS — O09899 Supervision of other high risk pregnancies, unspecified trimester: Secondary | ICD-10-CM

## 2017-12-30 DIAGNOSIS — Z23 Encounter for immunization: Secondary | ICD-10-CM | POA: Diagnosis not present

## 2017-12-30 DIAGNOSIS — R109 Unspecified abdominal pain: Secondary | ICD-10-CM | POA: Insufficient documentation

## 2017-12-30 DIAGNOSIS — O34219 Maternal care for unspecified type scar from previous cesarean delivery: Secondary | ICD-10-CM

## 2017-12-30 DIAGNOSIS — O288 Other abnormal findings on antenatal screening of mother: Secondary | ICD-10-CM

## 2017-12-30 DIAGNOSIS — O26893 Other specified pregnancy related conditions, third trimester: Secondary | ICD-10-CM | POA: Diagnosis not present

## 2017-12-30 DIAGNOSIS — Z3A33 33 weeks gestation of pregnancy: Secondary | ICD-10-CM | POA: Diagnosis not present

## 2017-12-30 DIAGNOSIS — O09219 Supervision of pregnancy with history of pre-term labor, unspecified trimester: Secondary | ICD-10-CM

## 2017-12-30 DIAGNOSIS — O09213 Supervision of pregnancy with history of pre-term labor, third trimester: Secondary | ICD-10-CM | POA: Diagnosis not present

## 2017-12-30 DIAGNOSIS — O4703 False labor before 37 completed weeks of gestation, third trimester: Secondary | ICD-10-CM

## 2017-12-30 DIAGNOSIS — O289 Unspecified abnormal findings on antenatal screening of mother: Secondary | ICD-10-CM

## 2017-12-30 DIAGNOSIS — O09893 Supervision of other high risk pregnancies, third trimester: Secondary | ICD-10-CM

## 2017-12-30 DIAGNOSIS — Z7982 Long term (current) use of aspirin: Secondary | ICD-10-CM | POA: Insufficient documentation

## 2017-12-30 LAB — URINALYSIS, ROUTINE W REFLEX MICROSCOPIC
Bilirubin Urine: NEGATIVE
GLUCOSE, UA: NEGATIVE mg/dL
Hgb urine dipstick: NEGATIVE
KETONES UR: NEGATIVE mg/dL
NITRITE: NEGATIVE
PH: 6 (ref 5.0–8.0)
PROTEIN: NEGATIVE mg/dL
Specific Gravity, Urine: 1.012 (ref 1.005–1.030)

## 2017-12-30 MED ORDER — NIFEDIPINE 10 MG PO CAPS: 10.0000 mg | ORAL_CAPSULE | ORAL | Status: DC | PRN

## 2017-12-30 MED ORDER — HYDROXYPROGESTERONE CAPROATE 275 MG/1.1ML ~~LOC~~ SOAJ
275.0000 mg | Freq: Once | SUBCUTANEOUS | Status: AC
  Administered 2017-12-30: 275 mg via SUBCUTANEOUS

## 2017-12-30 MED ORDER — INSULIN GLARGINE 100 UNIT/ML SOLOSTAR PEN: 55.0000 [IU] | PEN_INJECTOR | Freq: Every day | SUBCUTANEOUS | 10 refills | Status: DC

## 2017-12-30 MED ORDER — BETAMETHASONE SOD PHOS & ACET 6 (3-3) MG/ML IJ SUSP
12.0000 mg | Freq: Once | INTRAMUSCULAR | Status: AC
  Administered 2017-12-30: 12 mg via INTRAMUSCULAR
  Filled 2017-12-30: qty 2

## 2017-12-30 NOTE — MAU Note (Signed)
Pt reports abdominal pain that started like contractions that started today. Has not timed them. States she is currently moving and thinks she may have overdone it. States she tried a warm bath and it has not helped. Pt denies LOF, vaginal bleeding. Reports good fetal movement. Pt also has complaints left knee swelling.

## 2017-12-30 NOTE — Progress Notes (Signed)
Subjective:  Andrea [name] is a 27 y.o. G2P0101 at [redacted]w[redacted]d being seen today for ongoing prenatal care.  She is currently monitored for the following issues for this high-risk pregnancy and has Diabetes mellitus type 2, uncontrolled, without complications (HCC); Preexisting diabetes complicating pregnancy, antepartum; Previous cesarean delivery affecting pregnancy; BMI 40.0-44.9, adult (HCC); Healthcare maintenance; History of preterm delivery, currently pregnant; Supervision of other high risk pregnancy, antepartum; and Obesity in pregnancy on their problem list.  GDM: Patient taking insulin lantus 55 units at bedtime.  Reports no hypoglycemic episodes.  Tolerating medication well Fasting: 90-100 2hr PP: 110, 120.  Patient reports no complaints.  Contractions: Irregular. Vag. Bleeding: None.  Movement: Present. Denies leaking of fluid.   The following portions of the patient's history were reviewed and updated as appropriate: allergies, current medications, past family history, past medical history, past social history, past surgical history and problem list. Problem list updated.  Objective:   Vitals:   12/30/17 1626  BP: 134/66  Pulse: 88  Weight: 257 lb (116.6 kg)    Fetal Status:     Movement: Present     General:  Alert, oriented and cooperative. Patient is in no acute distress.  Skin: Skin is warm and dry. No rash noted.   Cardiovascular: Normal heart rate noted  Respiratory: Normal respiratory effort, no problems with respiration noted  Abdomen: Soft, gravid, appropriate for gestational age. Pain/Pressure: Present     Pelvic: Vag. Bleeding: None     Cervical exam deferred        Extremities: Normal range of motion.  Edema: Trace  Mental Status: Normal mood and affect. Normal behavior. Normal judgment and thought content.   Urinalysis:      Assessment and Plan:  Pregnancy: G2P0101 at [redacted]w[redacted]d  1. Supervision of other high risk pregnancy, antepartum FHt and Fh normal. Had  preterm contractions last night - got 1 dose of BMZ. - Tdap vaccine greater than or equal to 7yo IM  2. Diabetes mellitus type 2, uncontrolled, without complications (HCC) Scheduled for US on 9/24 - Insulin Glargine (LANTUS SOLOSTAR) 100 UNIT/ML Solostar Pen; Inject 55 Units into the skin daily at 10 pm.  Dispense: 5 pen; Refill: 10  3. Previous cesarean delivery affecting pregnancy TOLAC  4. History of preterm delivery, currently pregnant  - HYDROXYprogesterone Caproate SOAJ 275 mg  Preterm labor symptoms and general obstetric precautions including but not limited to vaginal bleeding, contractions, leaking of fluid and fetal movement were reviewed in detail with the patient. Please refer to After Visit Summary for other counseling recommendations.  No follow-ups on file.   Levie HeritageStinson, Andrea J, DO

## 2017-12-30 NOTE — MAU Provider Note (Signed)
Chief Complaint:  Abdominal Pain and Joint Swelling (knee)   First Provider Initiated Contact with Patient 12/30/17 0245     HPI: Andrea [name] is a 27 y.o. G2P0101 at 4w5dwho presents to maternity admissions reporting abdominal pain and contractions.  Thinks it is strain related to moving Has had left knee pain for months, now swelling. She reports good fetal movement, denies LOF, vaginal bleeding, vaginal itching/burning, urinary symptoms, h/a, dizziness, n/v, diarrhea, constipation or fever/chills.  She denies headache, visual changes or RUQ abdominal pain  Abdominal Pain  This is a new problem. The current episode started today. The onset quality is gradual. The problem occurs intermittently. The problem has been unchanged. The quality of the pain is cramping. The abdominal pain does not radiate. Pertinent negatives include no constipation, diarrhea, dysuria, myalgias, nausea or vomiting. Nothing aggravates the pain. The pain is relieved by nothing. She has tried nothing for the symptoms.   . RN Note: Pt reports abdominal pain that started like contractions that started today. Has not timed them. States she is currently moving and thinks she may have overdone it. States she tried a warm bath and it has not helped. Pt denies LOF, vaginal bleeding. Reports good fetal movement. Pt also has complaints left knee swelling.   Past Medical History: Past Medical History:  Diagnosis Date  . Anemia    as a teenager  . Chicken pox   . Diabetes mellitus    type 2 on insulin  . Impacted teeth with abnormal position    impacted wisdom teeth    Past obstetric history: OB History  Gravida Para Term Preterm AB Living  2 1 0 1 0 1  SAB TAB Ectopic Multiple Live Births  0 0 0 0 1    # Outcome Date GA Lbr Len/2nd Weight Sex Delivery Anes PTL Lv  2 Current           1 Preterm 09/16/15 [redacted]w[redacted]d  1950 g M CS-LTranv EPI  LIV    Past Surgical History: Past Surgical History:  Procedure Laterality  Date  . CESAREAN SECTION N/A 09/16/2015   Procedure: CESAREAN SECTION;  Surgeon: Lesly Dukes, MD;  Location: [facility] BIRTHING SUITES;  Service: Obstetrics;  Laterality: N/A;  . FOREIGN BODY REMOVAL Left 08/28/2015   Procedure: REMOVAL FOREIGN BODY foot;  Surgeon: Dannielle Huh, MD;  Location: [facility] OR;  Service: Orthopedics;  Laterality: Left;  regional block  . NO PAST SURGERIES    . TOOTH EXTRACTION N/A 07/24/2016   Procedure: EXTRACTION WISDOM TEETH ONE, SIXTEEN, SEVENTEEN AND THIRTY-TWO;  Surgeon: Ocie Doyne, DDS;  Location: [facility] OR;  Service: Oral Surgery;  Laterality: N/A;    Family History: Family History  Problem Relation Age of Onset  . Stroke Father   . Hypertension Father   . Diabetes Mellitus II Father   . Irregular heart beat Father   . Heart disease Father   . Hyperlipidemia Father   . Diabetes Mellitus II Mother   . Hyperlipidemia Mother   . Thyroid disease Mother   . Hypertension Maternal Aunt   . Thyroid disease Maternal Aunt   . Diabetes Mellitus II Paternal Grandmother   . Anesthesia problems Neg Hx     Social History: Social History   Tobacco Use  . Smoking status: Never Smoker  . Smokeless tobacco: Never Used  Substance Use Topics  . Alcohol use: Not Currently    Comment: rare  . Drug use: No    Allergies:  Allergies  Allergen Reactions  . Metformin And Related Diarrhea    Meds:  Facility-Administered Medications Prior to Admission  Medication Dose Route Frequency Provider Last Rate Last Dose  . HYDROXYprogesterone Caproate SOAJ 275 mg  275 mg Subcutaneous Weekly Adam PhenixArnold, James G, MD   275 mg at 11/24/17 1450   Medications Prior to Admission  Medication Sig Dispense Refill Last Dose  . aspirin EC 81 MG tablet Take 1 tablet (81 mg total) by mouth daily. Start at 12 weeks (07/31/17) 30 tablet 11 Taking  . Docusate Calcium (STOOL SOFTENER PO) Take by mouth.   Taking  . Elastic Bandages & Supports (COMFORT FIT MATERNITY SUPP MED) MISC 1 Device by Does not  apply route daily. 1 each 0 Taking  . Insulin Glargine (LANTUS SOLOSTAR) 100 UNIT/ML Solostar Pen Inject 55 Units into the skin daily at 10 pm. 5 pen 0 Taking  . Prenatal Vit-Fe Fumarate-FA (PREPLUS) 27-1 MG TABS Take 1 tablet by mouth daily.   Taking    I have reviewed patient's Past Medical Hx, Surgical Hx, Family Hx, Social Hx, medications and allergies.   ROS:  Review of Systems  Gastrointestinal: Positive for abdominal pain. Negative for constipation, diarrhea, nausea and vomiting.  Genitourinary: Negative for dysuria.  Musculoskeletal: Negative for myalgias.   Other systems negative  Physical Exam   Patient Vitals for the past 24 hrs:  BP Temp Temp src Pulse Resp SpO2 Height Weight  12/30/17 0152 123/81 97.6 F (36.4 C) Oral 83 18 100 % 5\' 1"  (1.549 m) 118.4 kg   Constitutional: Well-developed, well-nourished female in no acute distress.  Cardiovascular: normal rate and rhythm Respiratory: normal effort, clear to auscultation bilaterally GI: Abd soft, non-tender, gravid appropriate for gestational age.   No rebound or guarding. MS: Extremities nontender, no edema, normal ROM Neurologic: Alert and oriented x 4.  GU: Neg CVAT.  PELVIC EXAM: Dilation: 1 Effacement (%): 40 Station: -3 Presentation: Vertex Exam by:: Artelia LarocheM. Lauryl Seyer CNM    FHT:  Baseline 135 , moderate variability, accelerations present, no decelerations Contractions: irritability   Labs: B/Positive/-- (04/15 1540) Results for orders placed or performed during the hospital encounter of 12/30/17 (from the past 48 hour(s))  Urinalysis, Routine w reflex microscopic     Status: Abnormal   Collection Time: 12/30/17  5:15 AM  Result Value Ref Range   Color, Urine YELLOW YELLOW   APPearance CLEAR CLEAR   Specific Gravity, Urine 1.012 1.005 - 1.030   pH 6.0 5.0 - 8.0   Glucose, UA NEGATIVE NEGATIVE mg/dL   Hgb urine dipstick NEGATIVE NEGATIVE   Bilirubin Urine NEGATIVE NEGATIVE   Ketones, ur NEGATIVE NEGATIVE  mg/dL   Protein, ur NEGATIVE NEGATIVE mg/dL   Nitrite NEGATIVE NEGATIVE   Leukocytes, UA TRACE (A) NEGATIVE   RBC / HPF 0-5 0 - 5 RBC/hpf   WBC, UA 0-5 0 - 5 WBC/hpf   Bacteria, UA RARE (A) NONE SEEN   Squamous Epithelial / LPF 6-10 0 - 5    Comment: [facility], [address] [place], [place] [id number]    Imaging:   MAU Course/MDM: I have ordered labs and reviewed results.  NST reviewed and is reassuring.  Treatments in MAU included Fluids which slowed irritability and first dose of betamethasone, since her cervix has changed and her history of preterm delivery.  Did not require procardia. . .  NST was borderline reactive so US done which showed a BPP of 8/8  Assessment: 1/   History of  preterm delivery 2.   Knee swelling left 3.      Preterm uterine contractions 4.     Equivocal nonstress test  Plan: Discharge home Preterm Labor precautions and fetal kick counts Second betamethasone tomorrow Follow up in Office for prenatal visits and recheck of status  Encouraged to return here or to other Urgent Care/ED if she develops worsening of symptoms, increase in pain, fever, or other concerning symptoms.   Pt stable at time of discharge.  Wynelle Bourgeois CNM, MSN Certified Nurse-Midwife 12/30/2017 2:46 AM

## 2017-12-30 NOTE — Discharge Instructions (Signed)

## 2017-12-31 ENCOUNTER — Ambulatory Visit: Payer: Medicaid Other

## 2018-01-04 ENCOUNTER — Encounter (HOSPITAL_COMMUNITY): Payer: Self-pay

## 2018-01-04 ENCOUNTER — Ambulatory Visit (HOSPITAL_COMMUNITY)
Admission: RE | Admit: 2018-01-04 | Discharge: 2018-01-04 | Disposition: A | Discharge status: Home / Self Care | Payer: Medicaid Other | Source: Ambulatory Visit | Attending: Obstetrics and Gynecology | Admitting: Obstetrics and Gynecology

## 2018-01-04 DIAGNOSIS — O24119 Pre-existing diabetes mellitus, type 2, in pregnancy, unspecified trimester: Secondary | ICD-10-CM

## 2018-01-04 DIAGNOSIS — E1165 Type 2 diabetes mellitus with hyperglycemia: Secondary | ICD-10-CM | POA: Diagnosis not present

## 2018-01-04 DIAGNOSIS — O99213 Obesity complicating pregnancy, third trimester: Secondary | ICD-10-CM | POA: Diagnosis not present

## 2018-01-04 DIAGNOSIS — IMO0001 Reserved for inherently not codable concepts without codable children: Secondary | ICD-10-CM

## 2018-01-04 DIAGNOSIS — Z3A34 34 weeks gestation of pregnancy: Secondary | ICD-10-CM | POA: Diagnosis not present

## 2018-01-04 DIAGNOSIS — O09213 Supervision of pregnancy with history of pre-term labor, third trimester: Secondary | ICD-10-CM | POA: Insufficient documentation

## 2018-01-04 DIAGNOSIS — O24113 Pre-existing diabetes mellitus, type 2, in pregnancy, third trimester: Secondary | ICD-10-CM | POA: Insufficient documentation

## 2018-01-04 DIAGNOSIS — O09899 Supervision of other high risk pregnancies, unspecified trimester: Secondary | ICD-10-CM

## 2018-01-04 DIAGNOSIS — O34219 Maternal care for unspecified type scar from previous cesarean delivery: Secondary | ICD-10-CM | POA: Insufficient documentation

## 2018-01-04 DIAGNOSIS — O09219 Supervision of pregnancy with history of pre-term labor, unspecified trimester: Secondary | ICD-10-CM

## 2018-01-04 DIAGNOSIS — Z362 Encounter for other antenatal screening follow-up: Secondary | ICD-10-CM | POA: Insufficient documentation

## 2018-01-05 ENCOUNTER — Other Ambulatory Visit (HOSPITAL_COMMUNITY): Payer: Self-pay | Admitting: *Deleted

## 2018-01-05 DIAGNOSIS — O24119 Pre-existing diabetes mellitus, type 2, in pregnancy, unspecified trimester: Secondary | ICD-10-CM

## 2018-01-12 ENCOUNTER — Ambulatory Visit (HOSPITAL_COMMUNITY)
Admission: RE | Admit: 2018-01-12 | Discharge: 2018-01-12 | Disposition: A | Discharge status: Home / Self Care | Payer: Medicaid Other | Source: Ambulatory Visit | Attending: Advanced Practice Midwife | Admitting: Advanced Practice Midwife

## 2018-01-12 ENCOUNTER — Other Ambulatory Visit (HOSPITAL_COMMUNITY): Payer: Self-pay | Admitting: Obstetrics and Gynecology

## 2018-01-12 ENCOUNTER — Other Ambulatory Visit (HOSPITAL_COMMUNITY): Payer: Self-pay | Admitting: *Deleted

## 2018-01-12 ENCOUNTER — Encounter (HOSPITAL_COMMUNITY): Payer: Self-pay

## 2018-01-12 DIAGNOSIS — O99213 Obesity complicating pregnancy, third trimester: Secondary | ICD-10-CM | POA: Insufficient documentation

## 2018-01-12 DIAGNOSIS — E669 Obesity, unspecified: Secondary | ICD-10-CM | POA: Diagnosis not present

## 2018-01-12 DIAGNOSIS — O34219 Maternal care for unspecified type scar from previous cesarean delivery: Secondary | ICD-10-CM | POA: Diagnosis not present

## 2018-01-12 DIAGNOSIS — O24113 Pre-existing diabetes mellitus, type 2, in pregnancy, third trimester: Secondary | ICD-10-CM | POA: Diagnosis not present

## 2018-01-12 DIAGNOSIS — O24119 Pre-existing diabetes mellitus, type 2, in pregnancy, unspecified trimester: Secondary | ICD-10-CM | POA: Diagnosis present

## 2018-01-12 DIAGNOSIS — O24319 Unspecified pre-existing diabetes mellitus in pregnancy, unspecified trimester: Secondary | ICD-10-CM

## 2018-01-12 DIAGNOSIS — Z3A35 35 weeks gestation of pregnancy: Secondary | ICD-10-CM | POA: Insufficient documentation

## 2018-01-12 DIAGNOSIS — Z362 Encounter for other antenatal screening follow-up: Secondary | ICD-10-CM | POA: Insufficient documentation

## 2018-01-12 DIAGNOSIS — O09899 Supervision of other high risk pregnancies, unspecified trimester: Secondary | ICD-10-CM

## 2018-01-12 DIAGNOSIS — O09213 Supervision of pregnancy with history of pre-term labor, third trimester: Secondary | ICD-10-CM | POA: Insufficient documentation

## 2018-01-12 DIAGNOSIS — O09219 Supervision of pregnancy with history of pre-term labor, unspecified trimester: Secondary | ICD-10-CM

## 2018-01-12 NOTE — Procedures (Signed)
Andrea [name] Jun 13, 1990 [redacted]w[redacted]d  Fetus A Non-Stress Test Interpretation for 01/12/18  Indication: Diabetes  Fetal Heart Rate A Mode: External Baseline Rate (A): 140 bpm Variability: Moderate Accelerations: 15 x 15 Decelerations: None Multiple birth?: No  Uterine Activity Mode: Toco Contraction Frequency (min): one UC noted Contraction Duration (sec): 100 Contraction Quality: Mild Resting Tone Palpated: Relaxed Resting Time: Adequate  Interpretation (Fetal Testing) Nonstress Test Interpretation: Reactive Comments: FHR tracing rev'd by Dr. Perry Mount

## 2018-01-13 ENCOUNTER — Ambulatory Visit (INDEPENDENT_AMBULATORY_CARE_PROVIDER_SITE_OTHER): Payer: Medicaid Other | Admitting: Obstetrics & Gynecology

## 2018-01-13 VITALS — BP 133/70 | HR 89 | Wt 262.7 lb

## 2018-01-13 DIAGNOSIS — O09899 Supervision of other high risk pregnancies, unspecified trimester: Secondary | ICD-10-CM

## 2018-01-13 MED ORDER — HYDROXYPROGESTERONE CAPROATE 275 MG/1.1ML ~~LOC~~ SOAJ
275.0000 mg | Freq: Once | SUBCUTANEOUS | Status: AC
  Administered 2018-01-13: 275 mg via SUBCUTANEOUS

## 2018-01-13 NOTE — Progress Notes (Signed)
   PRENATAL VISIT NOTE  Subjective:  Andrea [name] is a 27 y.o. G2P0101 at [redacted]w[redacted]d being seen today for ongoing prenatal care.  She is currently monitored for the following issues for this high-risk pregnancy and has Diabetes mellitus type 2, uncontrolled, without complications (HCC); Preexisting diabetes complicating pregnancy, antepartum; Previous cesarean delivery affecting pregnancy; BMI 40.0-44.9, adult (HCC); Healthcare maintenance; History of preterm delivery, currently pregnant; Supervision of other high risk pregnancy, antepartum; and Obesity in pregnancy on their problem list.  Patient reports no complaints.  Contractions: Irregular. Vag. Bleeding: None.  Movement: Present. Denies leaking of fluid.   The following portions of the patient's history were reviewed and updated as appropriate: allergies, current medications, past family history, past medical history, past social history, past surgical history and problem list. Problem list updated.  Objective:   Vitals:   01/13/18 1616  BP: 133/70  Pulse: 89  Weight: 262 lb 11.2 oz (119.2 kg)    Fetal Status:     Movement: Present     General:  Alert, oriented and cooperative. Patient is in no acute distress.  Skin: Skin is warm and dry. No rash noted.   Cardiovascular: Normal heart rate noted  Respiratory: Normal respiratory effort, no problems with respiration noted  Abdomen: Soft, gravid, appropriate for gestational age.  Pain/Pressure: Present     Pelvic: Cervical exam deferred        Extremities: Normal range of motion.  Edema: Trace  Mental Status: Normal mood and affect. Normal behavior. Normal judgment and thought content.   Assessment and Plan:  Pregnancy: G2P0101 at [redacted]w[redacted]d  1. Supervision of other high risk pregnancy, antepartum FBS 92, states good control BG, had 10/10 BPP - HYDROXYprogesterone Caproate SOAJ 275 mg  Preterm labor symptoms and general obstetric precautions including but not limited to vaginal  bleeding, contractions, leaking of fluid and fetal movement were reviewed in detail with the patient. Please refer to After Visit Summary for other counseling recommendations.  Return in about 1 week (around 01/20/2018).  Future Appointments  Date Time Provider Department Center  01/19/2018 12:45 PM [facility]-M[facility] [place] 2 [facility]-MFCUS MFC-US  01/19/2018  1:15 PM WH-MFC NST WH-MFC MFC-US  01/26/2018 12:45 PM [facility] [place] [facility]-MFC[facility] MFC-US  01/26/2018  1:15 PM WH-MFC NST WH-MFC MFC-US  02/02/2018  2:00 PM WH-MFC [place] 3 WH-MFCUS MFC-US  02/02/2018  2:15 PM WH-MFC NST WH-MFC MFC-US    Scheryl Darter, MD

## 2018-01-13 NOTE — Patient Instructions (Signed)
Vaginal Delivery Vaginal delivery means that you will give birth by pushing your baby out of your birth canal (vagina). A team of health care providers will help you before, during, and after vaginal delivery. Birth experiences are unique for every woman and every pregnancy, and birth experiences vary depending on where you choose to give birth. What should I do to prepare for my baby's birth? Before your baby is born, it is important to talk with your health care provider about:  Your labor and delivery preferences. These may include: ? Medicines that you may be given. ? How you will manage your pain. This might include non-medical pain relief techniques or injectable pain relief such as epidural analgesia. ? How you and your baby will be monitored during labor and delivery. ? Who may be in the labor and delivery room with you. ? Your feelings about surgical delivery of your baby (cesarean delivery, or C-section) if this becomes necessary. ? Your feelings about receiving donated blood through an IV tube (blood transfusion) if this becomes necessary.  Whether you are able: ? To take pictures or videos of the birth. ? To eat during labor and delivery. ? To move around, walk, or change positions during labor and delivery.  What to expect after your baby is born, such as: ? Whether delayed umbilical cord clamping and cutting is offered. ? Who will care for your baby right after birth. ? Medicines or tests that may be recommended for your baby. ? Whether breastfeeding is supported in your hospital or birth center. ? How long you will be in the hospital or birth center.  How any medical conditions you have may affect your baby or your labor and delivery experience.  To prepare for your baby's birth, you should also:  Attend all of your health care visits before delivery (prenatal visits) as recommended by your health care provider. This is important.  Prepare your home for your baby's  arrival. Make sure that you have: ? Diapers. ? Baby clothing. ? Feeding equipment. ? Safe sleeping arrangements for you and your baby.  Install a car seat in your vehicle. Have your car seat checked by a certified car seat installer to make sure that it is installed safely.  Think about who will help you with your new baby at home for at least the first several weeks after delivery.  What can I expect when I arrive at the birth center or hospital? Once you are in labor and have been admitted into the hospital or birth center, your health care provider may:  Review your pregnancy history and any concerns you have.  Insert an IV tube into one of your veins. This is used to give you fluids and medicines.  Check your blood pressure, pulse, temperature, and heart rate (vital signs).  Check whether your bag of water (amniotic sac) has broken (ruptured).  Talk with you about your birth plan and discuss pain control options.  Monitoring Your health care provider may monitor your contractions (uterine monitoring) and your baby's heart rate (fetal monitoring). You may need to be monitored:  Often, but not continuously (intermittently).  All the time or for long periods at a time (continuously). Continuous monitoring may be needed if: ? You are taking certain medicines, such as medicine to relieve pain or make your contractions stronger. ? You have pregnancy or labor complications.  Monitoring may be done by:  Placing a special stethoscope or a handheld monitoring device on your abdomen to   check your baby's heartbeat, and feeling your abdomen for contractions. This method of monitoring does not continuously record your baby's heartbeat or your contractions.  Placing monitors on your abdomen (external monitors) to record your baby's heartbeat and the frequency and length of contractions. You may not have to wear external monitors all the time.  Placing monitors inside of your uterus  (internal monitors) to record your baby's heartbeat and the frequency, length, and strength of your contractions. ? Your health care provider may use internal monitors if he or she needs more information about the strength of your contractions or your baby's heart rate. ? Internal monitors are put in place by passing a thin, flexible wire through your vagina and into your uterus. Depending on the type of monitor, it may remain in your uterus or on your baby's head until birth. ? Your health care provider will discuss the benefits and risks of internal monitoring with you and will ask for your permission before inserting the monitors.  Telemetry. This is a type of continuous monitoring that can be done with external or internal monitors. Instead of having to stay in bed, you are able to move around during telemetry. Ask your health care provider if telemetry is an option for you.  Physical exam Your health care provider may perform a physical exam. This may include:  Checking whether your baby is positioned: ? With the head toward your vagina (head-down). This is most common. ? With the head toward the top of your uterus (head-up or breech). If your baby is in a breech position, your health care provider may try to turn your baby to a head-down position so you can deliver vaginally. If it does not seem that your baby can be born vaginally, your provider may recommend surgery to deliver your baby. In rare cases, you may be able to deliver vaginally if your baby is head-up (breech delivery). ? Lying sideways (transverse). Babies that are lying sideways cannot be delivered vaginally.  Checking your cervix to determine: ? Whether it is thinning out (effacing). ? Whether it is opening up (dilating). ? How low your baby has moved into your birth canal.  What are the three stages of labor and delivery?  Normal labor and delivery is divided into the following three stages: Stage 1  Stage 1 is the  longest stage of labor, and it can last for hours or days. Stage 1 includes: ? Early labor. This is when contractions may be irregular, or regular and mild. Generally, early labor contractions are more than 10 minutes apart. ? Active labor. This is when contractions get longer, more regular, more frequent, and more intense. ? The transition phase. This is when contractions happen very close together, are very intense, and may last longer than during any other part of labor.  Contractions generally feel mild, infrequent, and irregular at first. They get stronger, more frequent (about every 2-3 minutes), and more regular as you progress from early labor through active labor and transition.  Many women progress through stage 1 naturally, but you may need help to continue making progress. If this happens, your health care provider may talk with you about: ? Rupturing your amniotic sac if it has not ruptured yet. ? Giving you medicine to help make your contractions stronger and more frequent.  Stage 1 ends when your cervix is completely dilated to 4 inches (10 cm) and completely effaced. This happens at the end of the transition phase. Stage 2  Once   your cervix is completely effaced and dilated to 4 inches (10 cm), you may start to feel an urge to push. It is common for the body to naturally take a rest before feeling the urge to push, especially if you received an epidural or certain other pain medicines. This rest period may last for up to 1-2 hours, depending on your unique labor experience.  During stage 2, contractions are generally less painful, because pushing helps relieve contraction pain. Instead of contraction pain, you may feel stretching and burning pain, especially when the widest part of your baby's head passes through the vaginal opening (crowning).  Your health care provider will closely monitor your pushing progress and your baby's progress through the vagina during stage 2.  Your  health care provider may massage the area of skin between your vaginal opening and anus (perineum) or apply warm compresses to your perineum. This helps it stretch as the baby's head starts to crown, which can help prevent perineal tearing. ? In some cases, an incision may be made in your perineum (episiotomy) to allow the baby to pass through the vaginal opening. An episiotomy helps to make the opening of the vagina larger to allow more room for the baby to fit through.  It is very important to breathe and focus so your health care provider can control the delivery of your baby's head. Your health care provider may have you decrease the intensity of your pushing, to help prevent perineal tearing.  After delivery of your baby's head, the shoulders and the rest of the body generally deliver very quickly and without difficulty.  Once your baby is delivered, the umbilical cord may be cut right away, or this may be delayed for 1-2 minutes, depending on your baby's health. This may vary among health care providers, hospitals, and birth centers.  If you and your baby are healthy enough, your baby may be placed on your chest or abdomen to help maintain the baby's temperature and to help you bond with each other. Some mothers and babies start breastfeeding at this time. Your health care team will dry your baby and help keep your baby warm during this time.  Your baby may need immediate care if he or she: ? Showed signs of distress during labor. ? Has a medical condition. ? Was born too early (prematurely). ? Had a bowel movement before birth (meconium). ? Shows signs of difficulty transitioning from being inside the uterus to being outside of the uterus. If you are planning to breastfeed, your health care team will help you begin a feeding. Stage 3  The third stage of labor starts immediately after the birth of your baby and ends after you deliver the placenta. The placenta is an organ that develops  during pregnancy to provide oxygen and nutrients to your baby in the womb.  Delivering the placenta may require some pushing, and you may have mild contractions. Breastfeeding can stimulate contractions to help you deliver the placenta.  After the placenta is delivered, your uterus should tighten (contract) and become firm. This helps to stop bleeding in your uterus. To help your uterus contract and to control bleeding, your health care provider may: ? Give you medicine by injection, through an IV tube, by mouth, or through your rectum (rectally). ? Massage your abdomen or perform a vaginal exam to remove any blood clots that are left in your uterus. ? Empty your bladder by placing a thin, flexible tube (catheter) into your bladder. ? Encourage   you to breastfeed your baby. After labor is over, you and your baby will be monitored closely to ensure that you are both healthy until you are ready to go home. Your health care team will teach you how to care for yourself and your baby. This information is not intended to replace advice given to you by your health care provider. Make sure you discuss any questions you have with your health care provider. Document Released: 01/07/2008 Document Revised: 10/18/2015 Document Reviewed: 04/14/2015 Elsevier Interactive Patient Education  2018 [employer]  

## 2018-01-19 ENCOUNTER — Encounter (HOSPITAL_COMMUNITY): Payer: Self-pay

## 2018-01-19 ENCOUNTER — Ambulatory Visit (HOSPITAL_COMMUNITY)
Admission: RE | Admit: 2018-01-19 | Discharge: 2018-01-19 | Disposition: A | Discharge status: Home / Self Care | Payer: Medicaid Other | Source: Ambulatory Visit | Attending: Advanced Practice Midwife | Admitting: Advanced Practice Midwife

## 2018-01-19 DIAGNOSIS — O99213 Obesity complicating pregnancy, third trimester: Secondary | ICD-10-CM | POA: Diagnosis not present

## 2018-01-19 DIAGNOSIS — O24113 Pre-existing diabetes mellitus, type 2, in pregnancy, third trimester: Secondary | ICD-10-CM | POA: Insufficient documentation

## 2018-01-19 DIAGNOSIS — O34219 Maternal care for unspecified type scar from previous cesarean delivery: Secondary | ICD-10-CM | POA: Insufficient documentation

## 2018-01-19 DIAGNOSIS — O09899 Supervision of other high risk pregnancies, unspecified trimester: Secondary | ICD-10-CM

## 2018-01-19 DIAGNOSIS — O09219 Supervision of pregnancy with history of pre-term labor, unspecified trimester: Secondary | ICD-10-CM

## 2018-01-19 DIAGNOSIS — O09213 Supervision of pregnancy with history of pre-term labor, third trimester: Secondary | ICD-10-CM | POA: Diagnosis not present

## 2018-01-19 DIAGNOSIS — E1165 Type 2 diabetes mellitus with hyperglycemia: Principal | ICD-10-CM

## 2018-01-19 DIAGNOSIS — Z3A36 36 weeks gestation of pregnancy: Secondary | ICD-10-CM | POA: Insufficient documentation

## 2018-01-19 DIAGNOSIS — O24119 Pre-existing diabetes mellitus, type 2, in pregnancy, unspecified trimester: Secondary | ICD-10-CM

## 2018-01-19 DIAGNOSIS — IMO0001 Reserved for inherently not codable concepts without codable children: Secondary | ICD-10-CM

## 2018-01-19 NOTE — Procedures (Signed)
Andrea [name] 1991/02/11 [redacted]w[redacted]d  Fetus A Non-Stress Test Interpretation for 01/19/18  Indication: Diabetes  Fetal Heart Rate A Mode: External Baseline Rate (A): 140 bpm Variability: Moderate Accelerations: 15 x 15 Decelerations: None Multiple birth?: No  Uterine Activity Mode: Palpation, Toco Contraction Frequency (min): Occas. UI Contraction Duration (sec): 10-20 Contraction Quality: Mild Resting Tone Palpated: Relaxed Resting Time: Adequate  Interpretation (Fetal Testing) Nonstress Test Interpretation: Reactive Overall Impression: Reassuring for gestational age Comments: EFM tracing reviewed by Dr. Perry Mount

## 2018-01-20 ENCOUNTER — Other Ambulatory Visit (HOSPITAL_COMMUNITY): Payer: Self-pay | Admitting: Obstetrics and Gynecology

## 2018-01-20 ENCOUNTER — Encounter: Payer: Medicaid Other | Admitting: Obstetrics and Gynecology

## 2018-01-20 ENCOUNTER — Telehealth: Payer: Self-pay | Admitting: Obstetrics and Gynecology

## 2018-01-20 DIAGNOSIS — O24119 Pre-existing diabetes mellitus, type 2, in pregnancy, unspecified trimester: Secondary | ICD-10-CM

## 2018-01-20 NOTE — Telephone Encounter (Signed)
Called pt to get her missed [facility] rescheduled that was missed on 10/10. Pt did not answer and could not leave a VM because it stated that the was unavailable.

## 2018-01-26 ENCOUNTER — Other Ambulatory Visit (HOSPITAL_COMMUNITY): Payer: Self-pay | Admitting: Obstetrics and Gynecology

## 2018-01-26 ENCOUNTER — Encounter (HOSPITAL_COMMUNITY): Payer: Self-pay

## 2018-01-26 ENCOUNTER — Ambulatory Visit (HOSPITAL_COMMUNITY)
Admission: RE | Admit: 2018-01-26 | Discharge: 2018-01-26 | Disposition: A | Discharge status: Home / Self Care | Payer: Medicaid Other | Source: Ambulatory Visit | Attending: Advanced Practice Midwife | Admitting: Advanced Practice Midwife

## 2018-01-26 ENCOUNTER — Ambulatory Visit (HOSPITAL_COMMUNITY)
Admission: RE | Admit: 2018-01-26 | Discharge: 2018-01-26 | Disposition: A | Discharge status: Home / Self Care | Payer: Medicaid Other | Source: Ambulatory Visit | Attending: Obstetrics and Gynecology | Admitting: Obstetrics and Gynecology

## 2018-01-26 DIAGNOSIS — O24113 Pre-existing diabetes mellitus, type 2, in pregnancy, third trimester: Secondary | ICD-10-CM | POA: Insufficient documentation

## 2018-01-26 DIAGNOSIS — Z3A37 37 weeks gestation of pregnancy: Secondary | ICD-10-CM

## 2018-01-26 DIAGNOSIS — Z794 Long term (current) use of insulin: Principal | ICD-10-CM

## 2018-01-26 DIAGNOSIS — O24919 Unspecified diabetes mellitus in pregnancy, unspecified trimester: Secondary | ICD-10-CM

## 2018-01-26 DIAGNOSIS — O09899 Supervision of other high risk pregnancies, unspecified trimester: Secondary | ICD-10-CM

## 2018-01-26 DIAGNOSIS — O99213 Obesity complicating pregnancy, third trimester: Secondary | ICD-10-CM | POA: Diagnosis not present

## 2018-01-26 DIAGNOSIS — O24119 Pre-existing diabetes mellitus, type 2, in pregnancy, unspecified trimester: Secondary | ICD-10-CM | POA: Diagnosis present

## 2018-01-26 DIAGNOSIS — O34219 Maternal care for unspecified type scar from previous cesarean delivery: Secondary | ICD-10-CM

## 2018-01-26 DIAGNOSIS — O09213 Supervision of pregnancy with history of pre-term labor, third trimester: Secondary | ICD-10-CM

## 2018-01-26 DIAGNOSIS — O09219 Supervision of pregnancy with history of pre-term labor, unspecified trimester: Secondary | ICD-10-CM

## 2018-01-26 NOTE — Procedures (Signed)
RAYDEN SCHEPER 1990/08/02 [redacted]w[redacted]d  Fetus A Non-Stress Test Interpretation for 01/26/18  Indication: T2 diabetes - Insulin required  Fetal Heart Rate A Mode: External Baseline Rate (A): 145 bpm Variability: Moderate Accelerations: 15 x 15 Decelerations: None Multiple birth?: No  Uterine Activity Mode: Palpation, Toco Contraction Frequency (min): Erratic UI Contraction Duration (sec): 10-20 Contraction Quality: Mild Resting Tone Palpated: Relaxed Resting Time: Adequate  Interpretation (Fetal Testing) Nonstress Test Interpretation: Reactive Overall Impression: Reassuring for gestational age Comments: EFM tracing reviewed by Dr. Perry Mount

## 2018-02-02 ENCOUNTER — Encounter (HOSPITAL_COMMUNITY): Payer: Self-pay | Admitting: *Deleted

## 2018-02-02 ENCOUNTER — Encounter (HOSPITAL_COMMUNITY): Payer: Self-pay

## 2018-02-02 ENCOUNTER — Telehealth (HOSPITAL_COMMUNITY): Payer: Self-pay | Admitting: *Deleted

## 2018-02-02 ENCOUNTER — Other Ambulatory Visit (HOSPITAL_COMMUNITY): Payer: Self-pay | Admitting: Maternal and Fetal Medicine

## 2018-02-02 ENCOUNTER — Ambulatory Visit (HOSPITAL_COMMUNITY): Payer: Medicaid Other

## 2018-02-02 ENCOUNTER — Ambulatory Visit (HOSPITAL_COMMUNITY)
Admission: RE | Admit: 2018-02-02 | Discharge: 2018-02-02 | Disposition: A | Discharge status: Home / Self Care | Payer: Medicaid Other | Source: Ambulatory Visit | Attending: Advanced Practice Midwife | Admitting: Advanced Practice Midwife

## 2018-02-02 ENCOUNTER — Other Ambulatory Visit (HOSPITAL_COMMUNITY)
Admission: RE | Admit: 2018-02-02 | Discharge: 2018-02-02 | Disposition: A | Discharge status: Home / Self Care | Payer: Medicaid Other | Source: Ambulatory Visit | Attending: Family Medicine | Admitting: Family Medicine

## 2018-02-02 ENCOUNTER — Ambulatory Visit (INDEPENDENT_AMBULATORY_CARE_PROVIDER_SITE_OTHER): Payer: Medicaid Other | Admitting: Family Medicine

## 2018-02-02 VITALS — BP 129/85 | HR 94 | Wt 264.6 lb

## 2018-02-02 DIAGNOSIS — O99213 Obesity complicating pregnancy, third trimester: Secondary | ICD-10-CM

## 2018-02-02 DIAGNOSIS — O34219 Maternal care for unspecified type scar from previous cesarean delivery: Secondary | ICD-10-CM | POA: Diagnosis not present

## 2018-02-02 DIAGNOSIS — O24319 Unspecified pre-existing diabetes mellitus in pregnancy, unspecified trimester: Secondary | ICD-10-CM

## 2018-02-02 DIAGNOSIS — Z3A38 38 weeks gestation of pregnancy: Secondary | ICD-10-CM | POA: Insufficient documentation

## 2018-02-02 DIAGNOSIS — O09893 Supervision of other high risk pregnancies, third trimester: Secondary | ICD-10-CM | POA: Diagnosis present

## 2018-02-02 DIAGNOSIS — O09219 Supervision of pregnancy with history of pre-term labor, unspecified trimester: Secondary | ICD-10-CM

## 2018-02-02 DIAGNOSIS — O24113 Pre-existing diabetes mellitus, type 2, in pregnancy, third trimester: Secondary | ICD-10-CM | POA: Diagnosis not present

## 2018-02-02 DIAGNOSIS — O09213 Supervision of pregnancy with history of pre-term labor, third trimester: Secondary | ICD-10-CM

## 2018-02-02 DIAGNOSIS — O09899 Supervision of other high risk pregnancies, unspecified trimester: Secondary | ICD-10-CM

## 2018-02-02 DIAGNOSIS — O24119 Pre-existing diabetes mellitus, type 2, in pregnancy, unspecified trimester: Secondary | ICD-10-CM

## 2018-02-02 DIAGNOSIS — O24313 Unspecified pre-existing diabetes mellitus in pregnancy, third trimester: Secondary | ICD-10-CM

## 2018-02-02 LAB — POCT URINALYSIS DIP (DEVICE)
Bilirubin Urine: NEGATIVE
GLUCOSE, UA: NEGATIVE mg/dL
Hgb urine dipstick: NEGATIVE
KETONES UR: NEGATIVE mg/dL
NITRITE: NEGATIVE
PH: 6 (ref 5.0–8.0)
PROTEIN: NEGATIVE mg/dL
Specific Gravity, Urine: 1.02 (ref 1.005–1.030)
UROBILINOGEN UA: 0.2 mg/dL (ref 0.0–1.0)

## 2018-02-02 LAB — OB RESULTS CONSOLE GBS: STREP GROUP B AG: POSITIVE

## 2018-02-02 NOTE — Telephone Encounter (Signed)
Preadmission screen  

## 2018-02-02 NOTE — Progress Notes (Signed)
   PRENATAL VISIT NOTE  Subjective:  Andrea [name] is a 27 y.o. G2P0101 at [redacted]w[redacted]d being seen today for ongoing prenatal care.  She is currently monitored for the following issues for this high-risk pregnancy and has Diabetes mellitus type 2, uncontrolled, without complications (HCC); Preexisting diabetes complicating pregnancy, antepartum; Previous cesarean delivery affecting pregnancy; BMI 40.0-44.9, adult (HCC); Healthcare maintenance; History of preterm delivery, currently pregnant; Supervision of other high risk pregnancy, antepartum; and Obesity in pregnancy on their problem list.  Patient reports no complaints.  Contractions: Not present. Vag. Bleeding: None.  Movement: Present. Denies leaking of fluid.   The following portions of the patient's history were reviewed and updated as appropriate: allergies, current medications, past family history, past medical history, past social history, past surgical history and problem list. Problem list updated.  Objective:   Vitals:   02/02/18 1050  BP: 129/85  Pulse: 94  Weight: 264 lb 9.6 oz (120 kg)    Fetal Status: Fetal Heart Rate (bpm): 142   Movement: Present  Presentation: Vertex  General:  Alert, oriented and cooperative. Patient is in no acute distress.  Skin: Skin is warm and dry. No rash noted.   Cardiovascular: Normal heart rate noted  Respiratory: Normal respiratory effort, no problems with respiration noted  Abdomen: Soft, gravid, appropriate for gestational age.  Pain/Pressure: Present     Pelvic: Cervical exam performed Dilation: 1 Effacement (%): 50 Station: Ballotable  Extremities: Normal range of motion.  Edema: None  Mental Status: Normal mood and affect. Normal behavior. Normal judgment and thought content.   Assessment and Plan:  Pregnancy: G2P0101 at [redacted]w[redacted]d  1. Supervision of other high risk pregnancy, antepartum Cultures today - GC/Chlamydia probe amp ([place])not at [facility] - Culture, beta strep (group b  only)  2. Preexisting diabetes complicating pregnancy, antepartum No book today, no visit since 01/13/18. Needs IOL 39 wks for insulin requiring PGDM Has u/s for growth today with BPP  3. History of preterm delivery, currently pregnant Finished 17 P  4. Previous cesarean delivery affecting pregnancy Desires TOLAC--will need foley + pitocin for IOL--booked at 39 wks 02/05/18 at 7:30--orders placed.  Term labor symptoms and general obstetric precautions including but not limited to vaginal bleeding, contractions, leaking of fluid and fetal movement were reviewed in detail with the patient. Please refer to After Visit Summary for other counseling recommendations.  No follow-ups on file.  Future Appointments  Date Time Provider Department Center  02/02/2018  2:00 PM WH-MFC [place] 3 WH-MFCUS MFC-US  02/02/2018  2:15 PM WH-MFC NST WH-MFC MFC-US  02/05/2018  7:30 AM WH-BSSCHED ROOM WH-BSSCHED None    [name], MD

## 2018-02-02 NOTE — Patient Instructions (Signed)

## 2018-02-02 NOTE — Procedures (Signed)
Andrea [name] 1990/10/15 [redacted]w[redacted]d  Fetus A Non-Stress Test Interpretation for 02/02/18  Indication: Diabetes  Fetal Heart Rate A Mode: External Baseline Rate (A): 140 bpm Variability: Moderate Accelerations: 15 x 15 Decelerations: None Multiple birth?: No  Uterine Activity Mode: Toco Contraction Frequency (min): mild UI noted Contraction Duration (sec): 10-30 Contraction Quality: Mild Resting Tone Palpated: Relaxed Resting Time: Adequate  Interpretation (Fetal Testing) Nonstress Test Interpretation: Reactive Comments: FHR tracing rev'd by Dr. Judeth Cornfield

## 2018-02-03 LAB — GC/CHLAMYDIA PROBE AMP (~~LOC~~) NOT AT ARMC
Chlamydia: NEGATIVE
Neisseria Gonorrhea: NEGATIVE

## 2018-02-05 ENCOUNTER — Encounter: Payer: Self-pay | Admitting: Family Medicine

## 2018-02-05 ENCOUNTER — Inpatient Hospital Stay (HOSPITAL_COMMUNITY): Payer: Medicaid Other | Admitting: Anesthesiology

## 2018-02-05 ENCOUNTER — Inpatient Hospital Stay (HOSPITAL_COMMUNITY)
Admission: RE | Admit: 2018-02-05 | Discharge: 2018-02-09 | DRG: 788 | Disposition: A | Discharge status: Home / Self Care | Payer: Medicaid Other | Attending: Obstetrics & Gynecology | Admitting: Obstetrics & Gynecology

## 2018-02-05 ENCOUNTER — Encounter (HOSPITAL_COMMUNITY): Payer: Self-pay

## 2018-02-05 DIAGNOSIS — O2412 Pre-existing diabetes mellitus, type 2, in childbirth: Principal | ICD-10-CM | POA: Diagnosis present

## 2018-02-05 DIAGNOSIS — O34219 Maternal care for unspecified type scar from previous cesarean delivery: Secondary | ICD-10-CM | POA: Diagnosis present

## 2018-02-05 DIAGNOSIS — O24319 Unspecified pre-existing diabetes mellitus in pregnancy, unspecified trimester: Secondary | ICD-10-CM | POA: Diagnosis present

## 2018-02-05 DIAGNOSIS — O09899 Supervision of other high risk pregnancies, unspecified trimester: Secondary | ICD-10-CM

## 2018-02-05 DIAGNOSIS — D649 Anemia, unspecified: Secondary | ICD-10-CM | POA: Diagnosis present

## 2018-02-05 DIAGNOSIS — O24424 Gestational diabetes mellitus in childbirth, insulin controlled: Secondary | ICD-10-CM | POA: Diagnosis not present

## 2018-02-05 DIAGNOSIS — O99214 Obesity complicating childbirth: Secondary | ICD-10-CM | POA: Diagnosis present

## 2018-02-05 DIAGNOSIS — Z7982 Long term (current) use of aspirin: Secondary | ICD-10-CM | POA: Diagnosis not present

## 2018-02-05 DIAGNOSIS — Z794 Long term (current) use of insulin: Secondary | ICD-10-CM

## 2018-02-05 DIAGNOSIS — O34211 Maternal care for low transverse scar from previous cesarean delivery: Secondary | ICD-10-CM | POA: Diagnosis present

## 2018-02-05 DIAGNOSIS — O99820 Streptococcus B carrier state complicating pregnancy: Secondary | ICD-10-CM | POA: Insufficient documentation

## 2018-02-05 DIAGNOSIS — O9902 Anemia complicating childbirth: Secondary | ICD-10-CM | POA: Diagnosis present

## 2018-02-05 DIAGNOSIS — Z98891 History of uterine scar from previous surgery: Secondary | ICD-10-CM | POA: Diagnosis present

## 2018-02-05 DIAGNOSIS — O99824 Streptococcus B carrier state complicating childbirth: Secondary | ICD-10-CM | POA: Diagnosis present

## 2018-02-05 DIAGNOSIS — E1165 Type 2 diabetes mellitus with hyperglycemia: Secondary | ICD-10-CM | POA: Diagnosis present

## 2018-02-05 DIAGNOSIS — Z6841 Body Mass Index (BMI) 40.0 and over, adult: Secondary | ICD-10-CM

## 2018-02-05 DIAGNOSIS — Z3A39 39 weeks gestation of pregnancy: Secondary | ICD-10-CM | POA: Diagnosis not present

## 2018-02-05 DIAGNOSIS — O99210 Obesity complicating pregnancy, unspecified trimester: Secondary | ICD-10-CM | POA: Diagnosis present

## 2018-02-05 DIAGNOSIS — O09219 Supervision of pregnancy with history of pre-term labor, unspecified trimester: Secondary | ICD-10-CM

## 2018-02-05 LAB — COMPREHENSIVE METABOLIC PANEL
ALK PHOS: 154 U/L — AB (ref 38–126)
ALT: 10 U/L (ref 0–44)
AST: 19 U/L (ref 15–41)
Albumin: 2.9 g/dL — ABNORMAL LOW (ref 3.5–5.0)
Anion gap: 8 (ref 5–15)
BILIRUBIN TOTAL: 0.7 mg/dL (ref 0.3–1.2)
BUN: 5 mg/dL — ABNORMAL LOW (ref 6–20)
CALCIUM: 8.6 mg/dL — AB (ref 8.9–10.3)
CO2: 23 mmol/L (ref 22–32)
Chloride: 105 mmol/L (ref 98–111)
Creatinine, Ser: 0.65 mg/dL (ref 0.44–1.00)
GFR calc non Af Amer: >60 mL/min (ref >60)
Glucose, Bld: 71 mg/dL (ref 70–99)
Potassium: 4.1 mmol/L (ref 3.5–5.1)
Sodium: 136 mmol/L (ref 135–145)
TOTAL PROTEIN: 6.7 g/dL (ref 6.5–8.1)

## 2018-02-05 LAB — CBC
HCT: 32.6 % — ABNORMAL LOW (ref 36.0–46.0)
HEMATOCRIT: 32.6 % — AB (ref 36.0–46.0)
HEMOGLOBIN: 11.1 g/dL — AB (ref 12.0–15.0)
Hemoglobin: 11.2 g/dL — ABNORMAL LOW (ref 12.0–15.0)
MCH: 25.8 pg — ABNORMAL LOW (ref 26.0–34.0)
MCH: 25.9 pg — ABNORMAL LOW (ref 26.0–34.0)
MCHC: 34 g/dL (ref 30.0–36.0)
MCHC: 34.4 g/dL (ref 30.0–36.0)
MCV: 75.8 fL — AB (ref 80.0–100.0)
MCV: 75.5 fL — ABNORMAL LOW (ref 80.0–100.0)
NRBC: 0 % (ref 0.0–0.2)
Platelets: 303 10*3/uL (ref 150–400)
Platelets: 339 10*3/uL (ref 150–400)
RBC: 4.3 MIL/uL (ref 3.87–5.11)
RBC: 4.32 MIL/uL (ref 3.87–5.11)
RDW: 17 % — AB (ref 11.5–15.5)
RDW: 17.7 % — ABNORMAL HIGH (ref 11.5–15.5)
WBC: 7.3 10*3/uL (ref 4.0–10.5)
WBC: 9.3 10*3/uL (ref 4.0–10.5)

## 2018-02-05 LAB — GLUCOSE, CAPILLARY
Glucose-Capillary: 69 mg/dL — ABNORMAL LOW (ref 70–99)
Glucose-Capillary: 83 mg/dL (ref 70–99)

## 2018-02-05 LAB — GLUCOSE, RANDOM: GLUCOSE: 87 mg/dL (ref 70–99)

## 2018-02-05 LAB — TYPE AND SCREEN
ABO/RH(D): B POS
Antibody Screen: NEGATIVE

## 2018-02-05 LAB — CULTURE, BETA STREP (GROUP B ONLY): Strep Gp B Culture: POSITIVE — AB

## 2018-02-05 MED ORDER — FLEET ENEMA 7-19 GM/118ML RE ENEM: 1.0000 | ENEMA | RECTAL | Status: DC | PRN

## 2018-02-05 MED ORDER — OXYCODONE-ACETAMINOPHEN 5-325 MG PO TABS: 2.0000 | ORAL_TABLET | ORAL | Status: DC | PRN

## 2018-02-05 MED ORDER — TERBUTALINE SULFATE 1 MG/ML IJ SOLN: 0.2500 mg | Freq: Once | INTRAMUSCULAR | Status: DC | PRN

## 2018-02-05 MED ORDER — OXYCODONE-ACETAMINOPHEN 5-325 MG PO TABS: 1.0000 | ORAL_TABLET | ORAL | Status: DC | PRN

## 2018-02-05 MED ORDER — LACTATED RINGERS IV SOLN: 500.0000 mL | Freq: Once | INTRAVENOUS | Status: DC

## 2018-02-05 MED ORDER — LACTATED RINGERS IV SOLN
500.0000 mL | INTRAVENOUS | Status: DC | PRN
  Administered 2018-02-05 – 2018-02-06 (×2): 500 mL via INTRAVENOUS

## 2018-02-05 MED ORDER — DIPHENHYDRAMINE HCL 50 MG/ML IJ SOLN: 12.5000 mg | INTRAMUSCULAR | Status: DC | PRN

## 2018-02-05 MED ORDER — FENTANYL CITRATE (PF) 100 MCG/2ML IJ SOLN
100.0000 ug | INTRAMUSCULAR | Status: DC | PRN
  Administered 2018-02-05 (×2): 100 ug via INTRAVENOUS
  Filled 2018-02-05 (×2): qty 2

## 2018-02-05 MED ORDER — PHENYLEPHRINE 40 MCG/ML (10ML) SYRINGE FOR IV PUSH (FOR BLOOD PRESSURE SUPPORT)
80.0000 ug | PREFILLED_SYRINGE | INTRAVENOUS | Status: DC | PRN
  Filled 2018-02-05: qty 10

## 2018-02-05 MED ORDER — SOD CITRATE-CITRIC ACID 500-334 MG/5ML PO SOLN
30.0000 mL | ORAL | Status: DC | PRN
  Administered 2018-02-06: 30 mL via ORAL
  Filled 2018-02-05: qty 15

## 2018-02-05 MED ORDER — LACTATED RINGERS IV SOLN
INTRAVENOUS | Status: DC
  Administered 2018-02-05: 16:00:00 via INTRAVENOUS
  Administered 2018-02-05: 125 mL/h via INTRAVENOUS
  Administered 2018-02-05 – 2018-02-06 (×2): via INTRAVENOUS
  Administered 2018-02-06 (×2): 125 mL/h via INTRAVENOUS

## 2018-02-05 MED ORDER — OXYTOCIN 40 UNITS IN LACTATED RINGERS INFUSION - SIMPLE MED
1.0000 m[IU]/min | INTRAVENOUS | Status: DC
  Administered 2018-02-05: 2 m[IU]/min via INTRAVENOUS
  Administered 2018-02-06: 5 m[IU]/min via INTRAVENOUS
  Filled 2018-02-05 (×2): qty 1000

## 2018-02-05 MED ORDER — FENTANYL 2.5 MCG/ML BUPIVACAINE 1/10 % EPIDURAL INFUSION (WH - ANES)
14.0000 mL/h | INTRAMUSCULAR | Status: DC | PRN
  Administered 2018-02-05: 14 mL/h via EPIDURAL
  Administered 2018-02-05: 12 mL/h via EPIDURAL
  Administered 2018-02-06: 14 mL/h via EPIDURAL
  Filled 2018-02-05 (×3): qty 100

## 2018-02-05 MED ORDER — PHENYLEPHRINE 40 MCG/ML (10ML) SYRINGE FOR IV PUSH (FOR BLOOD PRESSURE SUPPORT): 80.0000 ug | PREFILLED_SYRINGE | INTRAVENOUS | Status: DC | PRN

## 2018-02-05 MED ORDER — ONDANSETRON HCL 4 MG/2ML IJ SOLN: 4.0000 mg | Freq: Four times a day (QID) | INTRAMUSCULAR | Status: DC | PRN

## 2018-02-05 MED ORDER — OXYTOCIN 40 UNITS IN LACTATED RINGERS INFUSION - SIMPLE MED: 2.5000 [IU]/h | INTRAVENOUS | Status: DC

## 2018-02-05 MED ORDER — LIDOCAINE HCL (PF) 1 % IJ SOLN
30.0000 mL | INTRAMUSCULAR | Status: DC | PRN
  Filled 2018-02-05: qty 30

## 2018-02-05 MED ORDER — PENICILLIN G 3 MILLION UNITS IVPB - SIMPLE MED
3.0000 10*6.[IU] | INTRAVENOUS | Status: DC
  Administered 2018-02-05 – 2018-02-06 (×6): 3 10*6.[IU] via INTRAVENOUS
  Filled 2018-02-05 (×3): qty 100

## 2018-02-05 MED ORDER — EPHEDRINE 5 MG/ML INJ: 10.0000 mg | INTRAVENOUS | Status: DC | PRN

## 2018-02-05 MED ORDER — OXYTOCIN BOLUS FROM INFUSION: 500.0000 mL | Freq: Once | INTRAVENOUS | Status: DC

## 2018-02-05 MED ORDER — ACETAMINOPHEN 325 MG PO TABS: 650.0000 mg | ORAL_TABLET | ORAL | Status: DC | PRN

## 2018-02-05 MED ORDER — LIDOCAINE HCL (PF) 1 % IJ SOLN
INTRAMUSCULAR | Status: DC | PRN
  Administered 2018-02-05 (×2): 4 mL via EPIDURAL

## 2018-02-05 MED ORDER — SODIUM CHLORIDE 0.9 % IV SOLN
5.0000 10*6.[IU] | Freq: Once | INTRAVENOUS | Status: AC
  Administered 2018-02-05: 5 10*6.[IU] via INTRAVENOUS
  Filled 2018-02-05: qty 5

## 2018-02-05 NOTE — Progress Notes (Signed)
LABOR PROGRESS NOTE  Andrea [name] is a 27 y.o. G2P0101 at 106w0d  admitted for IOL 2/2 IDDM pregestational.  Subjective: Epidural placed.  Intermittently elevated pressures, no signs of preeclampsia.  AROM.  Objective: BP 125/65   Pulse 74   Temp 97.8 F (36.6 C) (Oral)   Resp 20   Ht 5\' 1"  (1.549 m)   Wt 120.2 kg   LMP 05/08/2017 (Exact Date)   SpO2 100%   BMI 50.05 kg/m  or  Vitals:   02/05/18 1859 02/05/18 1900 02/05/18 1930 02/05/18 2005  BP:  127/79 133/81 125/65  Pulse:  71 78 74  Resp:   18 20  Temp: 97.9 F (36.6 C)   97.8 F (36.6 C)  TempSrc: Oral   Oral  SpO2:      Weight:      Height:        Dilation: 5 Effacement (%): 70 Cervical Position: Middle Station: -2 Presentation: Vertex Exam by:: Dr Eliott Nine: baseline rate 140, moderate varibility, +acels, no decel Toco: Difficult to read  Labs: Lab Results  Component Value Date   WBC 7.3 02/05/2018   HGB 11.1 (L) 02/05/2018   HCT 32.6 (L) 02/05/2018   MCV 75.8 (L) 02/05/2018   PLT 303 02/05/2018    Patient Active Problem List   Diagnosis Date Noted  . Pregestational diabetes mellitus, modified White class B 02/05/2018  . Group B Streptococcus carrier, +RV culture, currently pregnant 02/05/2018  . Obesity in pregnancy 08/23/2017  . History of preterm delivery, currently pregnant 07/26/2017  . Supervision of other high risk pregnancy, antepartum 07/26/2017  . Healthcare maintenance 06/01/2016  . BMI 40.0-44.9, adult (HCC) 02/25/2016  . Previous cesarean delivery affecting pregnancy 09/17/2015  . Preexisting diabetes complicating pregnancy, antepartum 04/15/2015  . Diabetes mellitus type 2, uncontrolled, without complications (HCC) 10/31/2012    Assessment / Plan: 27 y.o. G2P0101 at [redacted]w[redacted]d here for IOL 2/2 pregestational IDDM  Labor: IOL w/ pitocin and foley: Foley out at ~1440, will uptitrate pitocin.  Plan for AROM after epidural.  AROM at 2009 Fetal Wellbeing:  Category 1 Pain Control:   IV fentanyl now, epidural plan Anticipated MOD:  Vaginal  Pregestational IDDM: Hold home insulin - glucose q4hr in latent and q2hr in active labor - start inuslin gtt if >140, currently well-controlled Elevated BP: No history of HTN, but mild range pressures over the last 2hr - will order UPC and HELLP labs, plan to treat severe range pressures   Terisa Starr, MD 02/05/2018, 8:08 PM

## 2018-02-05 NOTE — Anesthesia Procedure Notes (Signed)
Epidural Patient location during procedure: OB Start time: 02/05/2018 5:53 PM End time: 02/05/2018 5:57 PM  Staffing Anesthesiologist: Kaylyn Layer, MD Performed: anesthesiologist   Preanesthetic Checklist Completed: patient identified, pre-op evaluation, timeout performed, IV checked, risks and benefits discussed and monitors and equipment checked  Epidural Patient position: sitting Prep: site prepped and draped and DuraPrep Patient monitoring: continuous pulse ox, blood pressure, heart rate and cardiac monitor Approach: midline Location: L3-L4 Injection technique: LOR air  Needle:  Needle type: Tuohy  Needle gauge: 17 G Needle length: 9 cm Needle insertion depth: 7 cm Catheter type: closed end flexible Catheter size: 19 Gauge Catheter at skin depth: 12 cm Test dose: negative and Other (1% lidocaine)  Assessment Sensory level: T8 Events: blood not aspirated, injection not painful, no injection resistance, negative IV test and no paresthesia  Additional Notes Patient identified. Risks, benefits, and alternatives discussed with patient including but not limited to bleeding, infection, nerve damage, paralysis, failed block, incomplete pain control, headache, blood pressure changes, nausea, vomiting, reactions to medication, itching, and postpartum back pain. Confirmed with bedside nurse the patient's most recent platelet count. Confirmed with patient that they are not currently taking any anticoagulation, have any bleeding history, or any family history of bleeding disorders. Patient expressed understanding and wished to proceed. All questions were answered. Sterile technique was used throughout the entire procedure. Please see nursing notes for vital signs. Crisp LOR on first pass. Test dose was given through epidural catheter and negative prior to continuing to dose epidural or start infusion. Warning signs of high block given to the patient including shortness of breath,  tingling/numbness in hands, complete motor block, or any concerning symptoms with instructions to call for help. Patient was given instructions on fall risk and not to get out of bed. All questions and concerns addressed with instructions to call with any issues or inadequate analgesia.  Reason for block:procedure for pain

## 2018-02-05 NOTE — Progress Notes (Signed)
LABOR PROGRESS NOTE  Andrea [name] is a 27 y.o. G2P0101 at [redacted]w[redacted]d  admitted for IOL 2/2 IDDM pregestational.  Subjective: Experiencing pain with contractions, controlled with fentanyl but patient would like epidural when safe.  Foley balloon out at 1440.  Objective: BP 138/67   Pulse 78   Temp 98 F (36.7 C) (Oral)   Ht 5\' 1"  (1.549 m)   Wt 120.2 kg   LMP 05/08/2017 (Exact Date)   BMI 50.05 kg/m  or  Vitals:   02/05/18 1300 02/05/18 1408 02/05/18 1454 02/05/18 1535  BP: 122/62 (!) 117/55 132/78 138/67  Pulse: 72 72 76 78  Temp:   98 F (36.7 C)   TempSrc:   Oral   Weight:      Height:        Dilation: 4 Effacement (%): 60 Station: -3 Presentation: Vertex Exam by:: [employer]: baseline rate 140, moderate varibility, +acels, no decel Toco: Difficult to read  Labs: Lab Results  Component Value Date   WBC 7.3 02/05/2018   HGB 11.1 (L) 02/05/2018   HCT 32.6 (L) 02/05/2018   MCV 75.8 (L) 02/05/2018   PLT 303 02/05/2018    Patient Active Problem List   Diagnosis Date Noted  . Pregestational diabetes mellitus, modified White class B 02/05/2018  . Group B Streptococcus carrier, +RV culture, currently pregnant 02/05/2018  . Obesity in pregnancy 08/23/2017  . History of preterm delivery, currently pregnant 07/26/2017  . Supervision of other high risk pregnancy, antepartum 07/26/2017  . Healthcare maintenance 06/01/2016  . BMI 40.0-44.9, adult (HCC) 02/25/2016  . Previous cesarean delivery affecting pregnancy 09/17/2015  . Preexisting diabetes complicating pregnancy, antepartum 04/15/2015  . Diabetes mellitus type 2, uncontrolled, without complications (HCC) 10/31/2012    Assessment / Plan: 27 y.o. G2P0101 at [redacted]w[redacted]d here for IOL 2/2 pregestational IDDM  Labor: IOL w/ pitocin and foley: Foley out at ~1440, will uptitrate pitocin.  Plan for AROM after epidural. Fetal Wellbeing:  Category 1 Pain Control:  IV fentanyl now, epidural plan Anticipated MOD:   Vaginal  Terisa Starr, MD OB Fellow  02/05/2018, 3:46 PM

## 2018-02-05 NOTE — H&P (Addendum)
LABOR AND DELIVERY ADMISSION HISTORY AND PHYSICAL NOTE  Andrea [name] is a 27 y.o. female G55P0101 with IUP at [redacted]w[redacted]d by L/10 presenting for IOL 2/2 A2GDM.  She reports positive fetal movement. She denies leakage of fluid or vaginal bleeding.  Prenatal History/Complications: PNC at [facility] Pregnancy complications:  - T2DM - insulin dependent - Obesity affecting pregnancy - BMI of 50 - TOLAC - history of CS due NRFHT, preterm  Past Medical History: Past Medical History:  Diagnosis Date  . Anemia    as a teenager  . Chicken pox   . Diabetes mellitus    type 2 on insulin  . Impacted teeth with abnormal position    impacted wisdom teeth    Past Surgical History: Past Surgical History:  Procedure Laterality Date  . CESAREAN SECTION N/A 09/16/2015   Procedure: CESAREAN SECTION;  Surgeon: Lesly Dukes, MD;  Location: [facility] BIRTHING SUITES;  Service: Obstetrics;  Laterality: N/A;  . FOREIGN BODY REMOVAL Left 08/28/2015   Procedure: REMOVAL FOREIGN BODY foot;  Surgeon: Dannielle Huh, MD;  Location: [facility] OR;  Service: Orthopedics;  Laterality: Left;  regional block  . NO PAST SURGERIES    . TOOTH EXTRACTION N/A 07/24/2016   Procedure: EXTRACTION WISDOM TEETH ONE, SIXTEEN, SEVENTEEN AND THIRTY-TWO;  Surgeon: Ocie Doyne, DDS;  Location: [facility] OR;  Service: Oral Surgery;  Laterality: N/A;    Obstetrical History: OB History    Gravida  2   Para  1   Term  0   Preterm  1   AB  0   Living  1     SAB  0   TAB  0   Ectopic  0   Multiple  0   Live Births  1           Social History: Social History   Socioeconomic History  . Marital status: Married    Spouse name: Not on file  . Number of children: Not on file  . Years of education: Not on file  . Highest education level: Not on file  Occupational History  . Not on file  Social Needs  . Financial resource strain: Not hard at all  . Food insecurity:    Worry: Never true    Inability: Never true  . Transportation needs:     Medical: No    Non-medical: Not on file  Tobacco Use  . Smoking status: Never Smoker  . Smokeless tobacco: Never Used  Substance and Sexual Activity  . Alcohol use: Not Currently    Comment: rare  . Drug use: No  . Sexual activity: Yes    Birth control/protection: None  Lifestyle  . Physical activity:    Days per week: 7 days    Minutes per session: 150+ min  . Stress: Not at all  Relationships  . Social connections:    Talks on phone: More than three times a week    Gets together: More than three times a week    Attends religious service: More than 4 times per year    Active member of club or organization: No    Attends meetings of clubs or organizations: Never    Relationship status: Married  Other Topics Concern  . Not on file  Social History Narrative  . Not on file    Family History: Family History  Problem Relation Age of Onset  . Stroke Father   . Hypertension Father   . Diabetes Mellitus II Father   .  Irregular heart beat Father   . Heart disease Father   . Hyperlipidemia Father   . Diabetes Mellitus II Mother   . Hyperlipidemia Mother   . Thyroid disease Mother   . Hypertension Maternal Aunt   . Thyroid disease Maternal Aunt   . Diabetes Mellitus II Maternal Grandmother   . Anesthesia problems Neg Hx     Allergies: Allergies  Allergen Reactions  . Metformin And Related Diarrhea    Medications Prior to Admission  Medication Sig Dispense Refill Last Dose  . aspirin EC 81 MG tablet Take 1 tablet (81 mg total) by mouth daily. Start at 12 weeks (07/31/17) 30 tablet 11 Taking  . Insulin Glargine (LANTUS SOLOSTAR) 100 UNIT/ML Solostar Pen Inject 55 Units into the skin daily at 10 pm. 5 pen 10 Taking  . Prenatal Vit-Fe Fumarate-FA (PREPLUS) 27-1 MG TABS Take 1 tablet by mouth daily.   Taking     Review of Systems  All systems reviewed and negative except as stated in HPI  Physical Exam Blood pressure 136/90, pulse 85, temperature 97.9 F (36.6  C), temperature source Oral, height 5\' 1"  (1.549 m), weight 120.2 kg, last menstrual period 05/08/2017, currently breastfeeding. General appearance: alert, oriented, NAD Lungs: normal respiratory effort Heart: regular rate Abdomen: soft, non-tender; gravid, FH appropriate for GA Extremities: No calf swelling or tenderness Presentation: cephalic by sutures Fetal monitoring: Category 1 - mod var, no decels or accels Uterine activity: Quiet    Prenatal labs: ABO, Rh: B/Positive/-- (04/15 1540) Antibody: Negative (04/15 1540) Rubella: 0.96 (04/15 1540) RPR: Non Reactive (08/14 1529)  HBsAg: Negative (04/15 1540)  HIV: Non Reactive (08/14 1529)  GC/Chlamydia: Negative GBS: Positive (10/23 0000)  2-hr GTT: NA - DM Genetic screening:  Negative Anatomy US: Normal  Prenatal Transfer Tool  Maternal Diabetes: Yes:  Diabetes Type:  Pre-pregnancy Genetic Screening: Normal Maternal Ultrasounds/Referrals: Normal Fetal Ultrasounds or other Referrals:  None Maternal Substance Abuse:  No Significant Maternal Medications:  Meds include: Other: Insulin Significant Maternal Lab Results: None  No results found for this or any previous visit (from the past 24 hour(s)).  Patient Active Problem List   Diagnosis Date Noted  . Pregestational diabetes mellitus, modified White class B 02/05/2018  . Group B Streptococcus carrier, +RV culture, currently pregnant 02/05/2018  . Obesity in pregnancy 08/23/2017  . History of preterm delivery, currently pregnant 07/26/2017  . Supervision of other high risk pregnancy, antepartum 07/26/2017  . Healthcare maintenance 06/01/2016  . BMI 40.0-44.9, adult (HCC) 02/25/2016  . Previous cesarean delivery affecting pregnancy 09/17/2015  . Preexisting diabetes complicating pregnancy, antepartum 04/15/2015  . Diabetes mellitus type 2, uncontrolled, without complications (HCC) 10/31/2012    Assessment: Andrea [name] is a 27 y.o. G2P0101 at [redacted]w[redacted]d here for IOL 2/2  T2DM affecting pregnancy  #Labor: IOL w/ low dose Pitocin+Foley #Pain: Epidural when able #FWB: Cat 1 strip. 3.5kg (growth scan at [redacted]w[redacted]d with est fw 3397g and AC 89th% #ID:  GBS+ penicillin #MOF: Breast #MOC: Declined #Circ:  Yes, outpatient  Terisa Starr, MD 02/05/2018, 9:00 AM   OB FELLOW HISTORY AND PHYSICAL ATTESTATION  I have seen and examined this patient; I agree with above documentation in the resident's note.   Marcy Siren, D.O. OB Fellow  02/05/2018, 11:10 AM

## 2018-02-05 NOTE — Anesthesia Preprocedure Evaluation (Signed)
Anesthesia Evaluation  Patient identified by MRN, date of birth, ID band Patient awake    Reviewed: Allergy & Precautions, Patient's Chart, lab work & pertinent test results  History of Anesthesia Complications Negative for: history of anesthetic complications  Airway Mallampati: II  TM Distance: >3 FB Neck ROM: Full    Dental no notable dental hx. (+) Teeth Intact, Dental Advisory Given   Pulmonary neg pulmonary ROS,    Pulmonary exam normal breath sounds clear to auscultation       Cardiovascular negative cardio ROS Normal cardiovascular exam Rhythm:Regular Rate:Normal     Neuro/Psych negative neurological ROS  negative psych ROS   GI/Hepatic negative GI ROS, Neg liver ROS,   Endo/Other  diabetes, Type 2, Insulin DependentMorbid obesityBMI 50  Renal/GU negative Renal ROS  negative genitourinary   Musculoskeletal negative musculoskeletal ROS (+)   Abdominal (+) + obese,   Peds negative pediatric ROS (+)  Hematology negative hematology ROS (+)   Anesthesia Other Findings   Reproductive/Obstetrics (+) Pregnancy                             Anesthesia Physical Anesthesia Plan  ASA: III  Anesthesia Plan: Epidural   Post-op Pain Management:    Induction:   PONV Risk Score and Plan: 2 and Treatment may vary due to age or medical condition  Airway Management Planned: Natural Airway  Additional Equipment:   Intra-op Plan:   Post-operative Plan:   Informed Consent: I have reviewed the patients History and Physical, chart, labs and discussed the procedure including the risks, benefits and alternatives for the proposed anesthesia with the patient or authorized representative who has indicated his/her understanding and acceptance.     Plan Discussed with: CRNA  Anesthesia Plan Comments:         Anesthesia Quick Evaluation

## 2018-02-06 ENCOUNTER — Encounter (HOSPITAL_COMMUNITY)
Admission: RE | Disposition: A | Discharge status: Home / Self Care | Payer: Self-pay | Source: Home / Self Care | Attending: Obstetrics & Gynecology

## 2018-02-06 ENCOUNTER — Encounter (HOSPITAL_COMMUNITY): Payer: Self-pay

## 2018-02-06 DIAGNOSIS — Z3A39 39 weeks gestation of pregnancy: Secondary | ICD-10-CM

## 2018-02-06 DIAGNOSIS — O24424 Gestational diabetes mellitus in childbirth, insulin controlled: Secondary | ICD-10-CM

## 2018-02-06 DIAGNOSIS — O99824 Streptococcus B carrier state complicating childbirth: Secondary | ICD-10-CM

## 2018-02-06 LAB — COMPREHENSIVE METABOLIC PANEL
ALT: 11 U/L (ref 0–44)
AST: 31 U/L (ref 15–41)
Albumin: 2.4 g/dL — ABNORMAL LOW (ref 3.5–5.0)
Alkaline Phosphatase: 153 U/L — ABNORMAL HIGH (ref 38–126)
Anion gap: 7 (ref 5–15)
BILIRUBIN TOTAL: 0.5 mg/dL (ref 0.3–1.2)
CHLORIDE: 107 mmol/L (ref 98–111)
CO2: 23 mmol/L (ref 22–32)
CREATININE: 0.65 mg/dL (ref 0.44–1.00)
Calcium: 8.5 mg/dL — ABNORMAL LOW (ref 8.9–10.3)
GFR calc Af Amer: >60 mL/min (ref >60)
Glucose, Bld: 79 mg/dL (ref 70–99)
Potassium: 4.1 mmol/L (ref 3.5–5.1)
Sodium: 137 mmol/L (ref 135–145)
TOTAL PROTEIN: 6 g/dL — AB (ref 6.5–8.1)

## 2018-02-06 LAB — CBC
HEMATOCRIT: 34.4 % — AB (ref 36.0–46.0)
Hemoglobin: 11.5 g/dL — ABNORMAL LOW (ref 12.0–15.0)
MCH: 25.5 pg — ABNORMAL LOW (ref 26.0–34.0)
MCHC: 33.4 g/dL (ref 30.0–36.0)
MCV: 76.3 fL — AB (ref 80.0–100.0)
PLATELETS: 257 10*3/uL (ref 150–400)
RBC: 4.51 MIL/uL (ref 3.87–5.11)
RDW: 16.9 % — ABNORMAL HIGH (ref 11.5–15.5)
WBC: 14.2 10*3/uL — ABNORMAL HIGH (ref 4.0–10.5)
nRBC: 0 % (ref 0.0–0.2)

## 2018-02-06 LAB — PROTEIN / CREATININE RATIO, URINE
Creatinine, Urine: 74 mg/dL
Creatinine, Urine: 47 mg/dL
PROTEIN CREATININE RATIO: 0.09 mg/mg{creat} (ref 0.00–0.15)
Protein Creatinine Ratio: 0.66 mg/mg{Cre} — ABNORMAL HIGH (ref 0.00–0.15)
TOTAL PROTEIN, URINE: 7 mg/dL
TOTAL PROTEIN, URINE: 31 mg/dL

## 2018-02-06 LAB — GLUCOSE, CAPILLARY
GLUCOSE-CAPILLARY: 65 mg/dL — AB (ref 70–99)
GLUCOSE-CAPILLARY: 80 mg/dL (ref 70–99)
GLUCOSE-CAPILLARY: 101 mg/dL — AB (ref 70–99)
Glucose-Capillary: 152 mg/dL — ABNORMAL HIGH (ref 70–99)
Glucose-Capillary: 62 mg/dL — ABNORMAL LOW (ref 70–99)
Glucose-Capillary: 75 mg/dL (ref 70–99)
Glucose-Capillary: 88 mg/dL (ref 70–99)

## 2018-02-06 LAB — RPR: RPR Ser Ql: NONREACTIVE

## 2018-02-06 SURGERY — Surgical Case
Anesthesia: Epidural | Site: Abdomen | Wound class: Clean Contaminated

## 2018-02-06 MED ORDER — TETANUS-DIPHTH-ACELL PERTUSSIS 5-2.5-18.5 LF-MCG/0.5 IM SUSP: 0.5000 mL | Freq: Once | INTRAMUSCULAR | Status: DC

## 2018-02-06 MED ORDER — METOCLOPRAMIDE HCL 5 MG/ML IJ SOLN
INTRAMUSCULAR | Status: AC
  Filled 2018-02-06: qty 2

## 2018-02-06 MED ORDER — DIPHENHYDRAMINE HCL 25 MG PO CAPS: 25.0000 mg | ORAL_CAPSULE | Freq: Four times a day (QID) | ORAL | Status: DC | PRN

## 2018-02-06 MED ORDER — METOCLOPRAMIDE HCL 5 MG/ML IJ SOLN
INTRAMUSCULAR | Status: DC | PRN
  Administered 2018-02-06: 5 mg via INTRAVENOUS

## 2018-02-06 MED ORDER — OXYTOCIN 40 UNITS IN LACTATED RINGERS INFUSION - SIMPLE MED: 1.0000 m[IU]/min | INTRAVENOUS | Status: DC

## 2018-02-06 MED ORDER — WITCH HAZEL-GLYCERIN EX PADS: 1.0000 "application " | MEDICATED_PAD | CUTANEOUS | Status: DC | PRN

## 2018-02-06 MED ORDER — ENOXAPARIN SODIUM 40 MG/0.4ML ~~LOC~~ SOLN: 40.0000 mg | SUBCUTANEOUS | Status: DC

## 2018-02-06 MED ORDER — OXYCODONE HCL 5 MG PO TABS
5.0000 mg | ORAL_TABLET | ORAL | Status: DC | PRN
  Administered 2018-02-07 – 2018-02-08 (×4): 5 mg via ORAL
  Filled 2018-02-06 (×4): qty 1

## 2018-02-06 MED ORDER — COCONUT OIL OIL: 1.0000 "application " | TOPICAL_OIL | Status: DC | PRN

## 2018-02-06 MED ORDER — DIBUCAINE 1 % RE OINT: 1.0000 "application " | TOPICAL_OINTMENT | RECTAL | Status: DC | PRN

## 2018-02-06 MED ORDER — INSULIN GLARGINE 100 UNIT/ML ~~LOC~~ SOLN
40.0000 [IU] | Freq: Every day | SUBCUTANEOUS | Status: DC
  Administered 2018-02-06 – 2018-02-08 (×3): 40 [IU] via SUBCUTANEOUS
  Filled 2018-02-06 (×3): qty 0.4

## 2018-02-06 MED ORDER — MEASLES, MUMPS & RUBELLA VAC ~~LOC~~ INJ
0.5000 mL | INJECTION | Freq: Once | SUBCUTANEOUS | Status: DC
  Filled 2018-02-06: qty 0.5

## 2018-02-06 MED ORDER — PROMETHAZINE HCL 25 MG/ML IJ SOLN: 6.2500 mg | INTRAMUSCULAR | Status: DC | PRN

## 2018-02-06 MED ORDER — INSULIN GLARGINE 100 UNIT/ML ~~LOC~~ SOLN
55.0000 [IU] | Freq: Every day | SUBCUTANEOUS | Status: DC
  Filled 2018-02-06: qty 0.55

## 2018-02-06 MED ORDER — OXYTOCIN 10 UNIT/ML IJ SOLN
INTRAMUSCULAR | Status: AC
  Filled 2018-02-06: qty 4

## 2018-02-06 MED ORDER — ENOXAPARIN SODIUM 60 MG/0.6ML ~~LOC~~ SOLN
0.5000 mg/kg | SUBCUTANEOUS | Status: DC
  Administered 2018-02-07 – 2018-02-09 (×3): 60 mg via SUBCUTANEOUS
  Filled 2018-02-06 (×3): qty 0.6

## 2018-02-06 MED ORDER — DEXTROSE 5 % IV SOLN
INTRAVENOUS | Status: DC | PRN
  Administered 2018-02-06: 3 g via INTRAVENOUS

## 2018-02-06 MED ORDER — MENTHOL 3 MG MT LOZG: 1.0000 | LOZENGE | OROMUCOSAL | Status: DC | PRN

## 2018-02-06 MED ORDER — LACTATED RINGERS IV SOLN
INTRAVENOUS | Status: DC
  Administered 2018-02-06 – 2018-02-07 (×2): via INTRAVENOUS

## 2018-02-06 MED ORDER — FENTANYL CITRATE (PF) 100 MCG/2ML IJ SOLN
INTRAMUSCULAR | Status: AC
  Filled 2018-02-06: qty 2

## 2018-02-06 MED ORDER — PHENYLEPHRINE 40 MCG/ML (10ML) SYRINGE FOR IV PUSH (FOR BLOOD PRESSURE SUPPORT)
PREFILLED_SYRINGE | INTRAVENOUS | Status: DC | PRN
  Administered 2018-02-06: 40 ug via INTRAVENOUS
  Administered 2018-02-06 (×2): 80 ug via INTRAVENOUS
  Administered 2018-02-06: 40 ug via INTRAVENOUS
  Administered 2018-02-06 (×2): 80 ug via INTRAVENOUS

## 2018-02-06 MED ORDER — PRENATAL MULTIVITAMIN CH
1.0000 | ORAL_TABLET | Freq: Every day | ORAL | Status: DC
  Administered 2018-02-07 – 2018-02-08 (×2): 1 via ORAL
  Filled 2018-02-06 (×2): qty 1

## 2018-02-06 MED ORDER — ERYTHROMYCIN 5 MG/GM OP OINT
TOPICAL_OINTMENT | OPHTHALMIC | Status: AC
  Filled 2018-02-06: qty 1

## 2018-02-06 MED ORDER — INSULIN ASPART 100 UNIT/ML ~~LOC~~ SOLN: 0.0000 [IU] | Freq: Three times a day (TID) | SUBCUTANEOUS | Status: DC

## 2018-02-06 MED ORDER — ONDANSETRON HCL 4 MG/2ML IJ SOLN
INTRAMUSCULAR | Status: AC
  Filled 2018-02-06: qty 2

## 2018-02-06 MED ORDER — ZOLPIDEM TARTRATE 5 MG PO TABS: 5.0000 mg | ORAL_TABLET | Freq: Every evening | ORAL | Status: DC | PRN

## 2018-02-06 MED ORDER — MORPHINE SULFATE (PF) 0.5 MG/ML IJ SOLN
INTRAMUSCULAR | Status: AC
  Filled 2018-02-06: qty 10

## 2018-02-06 MED ORDER — OXYCODONE HCL 5 MG/5ML PO SOLN: 5.0000 mg | Freq: Once | ORAL | Status: DC | PRN

## 2018-02-06 MED ORDER — ACETAMINOPHEN 325 MG PO TABS
650.0000 mg | ORAL_TABLET | ORAL | Status: DC | PRN
  Administered 2018-02-06 – 2018-02-07 (×2): 650 mg via ORAL
  Filled 2018-02-06: qty 2

## 2018-02-06 MED ORDER — OXYCODONE HCL 5 MG PO TABS: 10.0000 mg | ORAL_TABLET | ORAL | Status: DC | PRN

## 2018-02-06 MED ORDER — OXYCODONE HCL 5 MG PO TABS: 5.0000 mg | ORAL_TABLET | Freq: Once | ORAL | Status: DC | PRN

## 2018-02-06 MED ORDER — IBUPROFEN 600 MG PO TABS
600.0000 mg | ORAL_TABLET | Freq: Four times a day (QID) | ORAL | Status: DC
  Administered 2018-02-06 – 2018-02-09 (×11): 600 mg via ORAL
  Filled 2018-02-06 (×11): qty 1

## 2018-02-06 MED ORDER — SENNOSIDES-DOCUSATE SODIUM 8.6-50 MG PO TABS
2.0000 | ORAL_TABLET | ORAL | Status: DC
  Administered 2018-02-07 – 2018-02-08 (×3): 2 via ORAL
  Filled 2018-02-06 (×3): qty 2

## 2018-02-06 MED ORDER — LACTATED RINGERS IV SOLN
INTRAVENOUS | Status: DC | PRN
  Administered 2018-02-06: 12:00:00 via INTRAVENOUS

## 2018-02-06 MED ORDER — OXYTOCIN 40 UNITS IN LACTATED RINGERS INFUSION - SIMPLE MED: 2.5000 [IU]/h | INTRAVENOUS | Status: AC

## 2018-02-06 MED ORDER — FENTANYL CITRATE (PF) 100 MCG/2ML IJ SOLN
INTRAMUSCULAR | Status: DC | PRN
  Administered 2018-02-06 (×2): 25 ug via INTRAVENOUS

## 2018-02-06 MED ORDER — SIMETHICONE 80 MG PO CHEW: 80.0000 mg | CHEWABLE_TABLET | ORAL | Status: DC | PRN

## 2018-02-06 MED ORDER — ONDANSETRON HCL 4 MG/2ML IJ SOLN
INTRAMUSCULAR | Status: DC | PRN
  Administered 2018-02-06: 4 mg via INTRAVENOUS

## 2018-02-06 MED ORDER — OXYTOCIN 10 UNIT/ML IJ SOLN
INTRAVENOUS | Status: DC | PRN
  Administered 2018-02-06: 40 [IU] via INTRAVENOUS

## 2018-02-06 MED ORDER — MORPHINE SULFATE (PF) 0.5 MG/ML IJ SOLN
INTRAMUSCULAR | Status: DC | PRN
  Administered 2018-02-06: 1.5 mg via EPIDURAL

## 2018-02-06 MED ORDER — SIMETHICONE 80 MG PO CHEW
80.0000 mg | CHEWABLE_TABLET | ORAL | Status: DC
  Administered 2018-02-07 – 2018-02-08 (×2): 80 mg via ORAL
  Filled 2018-02-06 (×3): qty 1

## 2018-02-06 MED ORDER — HYDROMORPHONE HCL 1 MG/ML IJ SOLN: 0.2500 mg | INTRAMUSCULAR | Status: DC | PRN

## 2018-02-06 MED ORDER — LIDOCAINE-EPINEPHRINE (PF) 2 %-1:200000 IJ SOLN
INTRAMUSCULAR | Status: DC | PRN
  Administered 2018-02-06 (×3): 5 mL via EPIDURAL

## 2018-02-06 MED ORDER — SIMETHICONE 80 MG PO CHEW
80.0000 mg | CHEWABLE_TABLET | Freq: Three times a day (TID) | ORAL | Status: DC
  Administered 2018-02-06 – 2018-02-08 (×7): 80 mg via ORAL
  Filled 2018-02-06 (×6): qty 1

## 2018-02-06 MED ORDER — ACETAMINOPHEN 500 MG PO TABS
1000.0000 mg | ORAL_TABLET | Freq: Once | ORAL | Status: AC
  Administered 2018-02-06: 1000 mg via ORAL
  Filled 2018-02-06: qty 2

## 2018-02-06 SURGICAL SUPPLY — 43 items
ADH SKN CLS APL DERMABOND .7 (GAUZE/BANDAGES/DRESSINGS) ×1
CHLORAPREP W/TINT 26ML (MISCELLANEOUS) ×3 IMPLANT
CLAMP CORD UMBIL (MISCELLANEOUS) IMPLANT
CLOTH BEACON ORANGE TIMEOUT ST (SAFETY) ×3 IMPLANT
DERMABOND ADVANCED (GAUZE/BANDAGES/DRESSINGS) ×2
DERMABOND ADVANCED .7 DNX12 (GAUZE/BANDAGES/DRESSINGS) ×2 IMPLANT
DRSG OPSITE POSTOP 4X10 (GAUZE/BANDAGES/DRESSINGS) ×3 IMPLANT
ELECT REM PT RETURN 9FT ADLT (ELECTROSURGICAL) ×3
ELECTRODE REM PT RTRN 9FT ADLT (ELECTROSURGICAL) ×1 IMPLANT
EXTRACTOR VACUUM BELL STYLE (SUCTIONS) IMPLANT
GLOVE BIOGEL PI IND STRL 7.0 (GLOVE) ×1 IMPLANT
GLOVE BIOGEL PI IND STRL 8 (GLOVE) ×1 IMPLANT
GLOVE BIOGEL PI INDICATOR 7.0 (GLOVE) ×2
GLOVE BIOGEL PI INDICATOR 8 (GLOVE) ×2
GLOVE ECLIPSE 8.0 STRL XLNG CF (GLOVE) ×3 IMPLANT
GOWN STRL REUS W/TWL LRG LVL3 (GOWN DISPOSABLE) ×6 IMPLANT
HOVERMATT SINGLE USE (MISCELLANEOUS) ×2 IMPLANT
KIT ABG SYR 3ML LUER SLIP (SYRINGE) ×3 IMPLANT
NDL HYPO 18GX1.5 BLUNT FILL (NEEDLE) ×1 IMPLANT
NDL HYPO 25X5/8 SAFETYGLIDE (NEEDLE) ×1 IMPLANT
NEEDLE HYPO 18GX1.5 BLUNT FILL (NEEDLE) ×3 IMPLANT
NEEDLE HYPO 22GX1.5 SAFETY (NEEDLE) ×3 IMPLANT
NEEDLE HYPO 25X5/8 SAFETYGLIDE (NEEDLE) ×3 IMPLANT
NS IRRIG 1000ML POUR BTL (IV SOLUTION) ×3 IMPLANT
PACK C SECTION WH (CUSTOM PROCEDURE TRAY) ×3 IMPLANT
PAD OB MATERNITY 4.3X12.25 (PERSONAL CARE ITEMS) ×3 IMPLANT
PENCIL SMOKE EVAC W/HOLSTER (ELECTROSURGICAL) ×3 IMPLANT
RETRACTOR TRAXI PANNICULUS (MISCELLANEOUS) IMPLANT
RTRCTR C-SECT PINK 25CM LRG (MISCELLANEOUS) IMPLANT
SPONGE LAP 18X18 RF (DISPOSABLE) ×9 IMPLANT
SUT CHROMIC 0 CT 1 (SUTURE) ×3 IMPLANT
SUT MNCRL 0 VIOLET CTX 36 (SUTURE) ×2 IMPLANT
SUT MONOCRYL 0 CTX 36 (SUTURE) ×4
SUT PLAIN 2 0 (SUTURE)
SUT PLAIN 2 0 XLH (SUTURE) IMPLANT
SUT PLAIN ABS 2-0 CT1 27XMFL (SUTURE) IMPLANT
SUT VIC AB 0 CTX 36 (SUTURE) ×3
SUT VIC AB 0 CTX36XBRD ANBCTRL (SUTURE) ×1 IMPLANT
SUT VIC AB 4-0 KS 27 (SUTURE) IMPLANT
SYR 20CC LL (SYRINGE) ×6 IMPLANT
TOWEL OR 17X24 6PK STRL BLUE (TOWEL DISPOSABLE) ×3 IMPLANT
TRAXI PANNICULUS RETRACTOR (MISCELLANEOUS) ×2
TRAY FOLEY W/BAG SLVR 14FR LF (SET/KITS/TRAYS/PACK) IMPLANT

## 2018-02-06 NOTE — Anesthesia Postprocedure Evaluation (Signed)
Anesthesia Post Note  Patient: Andrea [name]  Procedure(s) Performed: CESAREAN SECTION (N/A Abdomen)     Patient location during evaluation: PACU Anesthesia Type: Epidural Level of consciousness: awake and alert Pain management: pain level controlled Vital Signs Assessment: post-procedure vital signs reviewed and stable Respiratory status: spontaneous breathing, nonlabored ventilation and respiratory function stable Cardiovascular status: blood pressure returned to baseline and stable Postop Assessment: no apparent nausea or vomiting Anesthetic complications: no    Last Vitals:  Vitals:   02/06/18 1415 02/06/18 1440  BP:  (!) 144/93  Pulse: 82 84  Resp: (!) 24 20  Temp: 37.3 C 37.8 C  SpO2:  100%    Last Pain:  Vitals:   02/06/18 1440  TempSrc: Oral  PainSc:    Pain Goal:                 Lowella Curb

## 2018-02-06 NOTE — Lactation Note (Signed)
This note was copied from a baby's chart. Lactation Consultation Note  Patient Name: Andrea [name] [id number] Date: 02/06/2018 Reason for consult: Initial assessment;Term  P2 mother whose infant is now 101 hours old.  Mother did not breast feed her first child but pumped and bottle fed.  She wants to breast/bottle feed this baby.  Mother attempting to breast feed when I arrived.  Her body position, hand position, hold and baby position was not conducive to a successful feed.  Baby was sucking on the end of her nipple that she had compressed between her two fingers.  I offered to assist and she accepted, although I felt like she was not very interested in having my assistance.  Instead of the cradle hold which she was using, I suggested the football hold.  Her breasts are very large and she had a cesarean section.  She did not want to do that hold.  I then suggested the cross cradle which she reluctantly agreed to.  Assisted to latch baby onto the right breast in the cross cradle hold.  Demonstrated breast compressions and finger placement.  Mother needed frequent reminders on proper positioning.  I educated her on the importance of maintaining a deep latch.  She continued to compress tissue close to latch so he "could breath."  I educated her again on this.  I showed her how comfortable he was and that he was sucking and not in distress.    Encouraged to feed 8-12 times/24 hours or sooner if he showed cues.  She told me she was familiar with hand expression and did not need to review.  Offered for RN/LC to assist as needed if she had any concerns.    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  Mom made aware of O/P services, breastfeeding support groups, community resources, and our phone # for post-discharge questions. Father and family in room.     Maternal Data Formula Feeding for Exclusion: No Has patient been taught Hand Expression?: Yes Does the patient have  breastfeeding experience prior to this delivery?: No  Feeding Feeding Type: Breast Fed  LATCH Score Latch: Grasps breast easily, tongue down, lips flanged, rhythmical sucking.  Audible Swallowing: None  Type of Nipple: Everted at rest and after stimulation(short shafted)  Comfort (Breast/Nipple): Soft / non-tender  Hold (Positioning): Assistance needed to correctly position infant at breast and maintain latch.  LATCH Score: 7  Interventions Interventions: Breast feeding basics reviewed;Assisted with latch;Skin to skin;Breast massage;Hand express;Position options;Support pillows;Adjust position;Breast compression  Lactation Tools Discussed/Used     Consult Status Consult Status: Follow-up Date: 02/07/18 Follow-up type: In-patient    Dora Sims 02/06/2018, 4:39 PM

## 2018-02-06 NOTE — Anesthesia Postprocedure Evaluation (Signed)
Anesthesia Post Note  Patient: Andrea [name]  Procedure(s) Performed: CESAREAN SECTION (N/A Abdomen)     Patient location during evaluation: Mother Baby Anesthesia Type: Epidural Level of consciousness: awake and alert and oriented Pain management: satisfactory to patient Vital Signs Assessment: post-procedure vital signs reviewed and stable Respiratory status: respiratory function stable Cardiovascular status: stable Postop Assessment: no headache, no backache, epidural receding, patient able to bend at knees, no signs of nausea or vomiting and adequate PO intake Anesthetic complications: no    Last Vitals:  Vitals:   02/06/18 1550 02/06/18 1700  BP: 133/88 140/84  Pulse: 91 87  Resp: 18 18  Temp: 36.8 C 36.8 C  SpO2: 99% 100%    Last Pain:  Vitals:   02/06/18 1700  TempSrc: Oral  PainSc: 8    Pain Goal:                 Marlyn Tondreau

## 2018-02-06 NOTE — Progress Notes (Signed)
LABOR PROGRESS NOTE  Andrea [name] is a 27 y.o. G2P0101 at [redacted]w[redacted]d  admitted for IOL T2DM  Subjective: Patient growing weary of labor, desire CS.  Objective: BP 138/78   Pulse 73   Temp 98.5 F (36.9 C) (Oral)   Resp 20   Ht 5\' 1"  (1.549 m)   Wt 120.2 kg   LMP 05/08/2017 (Exact Date)   SpO2 100%   BMI 50.05 kg/m  or  Vitals:   02/06/18 0950 02/06/18 1000 02/06/18 1030 02/06/18 1040  BP: (!) 156/88 (!) 158/86 (!) 164/89 138/78  Pulse: (!) 104 92 74 73  Resp: 18 18 20 20   Temp:  98.5 F (36.9 C)    TempSrc:  Oral    SpO2:      Weight:      Height:        Dilation: 7-8 Effacement (%): 100 Cervical Position: Middle Station: -1 Presentation: Vertex Exam by: Myself FHT: baseline rate 150, moderate varibility, + acel, variable and late decels Toco: irregular contractions; difficult to trace  Labs: Lab Results  Component Value Date   WBC 9.3 02/05/2018   HGB 11.2 (L) 02/05/2018   HCT 32.6 (L) 02/05/2018   MCV 75.5 (L) 02/05/2018   PLT 339 02/05/2018    Patient Active Problem List   Diagnosis Date Noted  . Pregestational diabetes mellitus, modified White class B 02/05/2018  . Group B Streptococcus carrier, +RV culture, currently pregnant 02/05/2018  . Obesity in pregnancy 08/23/2017  . History of preterm delivery, currently pregnant 07/26/2017  . Supervision of other high risk pregnancy, antepartum 07/26/2017  . Healthcare maintenance 06/01/2016  . BMI 40.0-44.9, adult (HCC) 02/25/2016  . Previous cesarean delivery affecting pregnancy 09/17/2015  . Preexisting diabetes complicating pregnancy, antepartum 04/15/2015  . Diabetes mellitus type 2, uncontrolled, without complications (HCC) 10/31/2012    Assessment / Plan: 27 y.o. G2P0101 at [redacted]w[redacted]d here for IOL for T2DM  Labor  Arrest of dilation: Patient unchanged over the last 6hr despite adequate contractions for the majority of her labor, intermittent decelerations.  Patient elects to proceed with CS. -  Consent for CS obtained - Will plan for rCS today 10/27  Fetal Wellbeing:  Cat 2 strip (reassuring) Pain Control:  Epidural Anticipated MOD:  CS  Pregestational IDDM: Hold home insulin - glucose q4hr in latent and q2hr in active labor - start inuslin gtt if >140, currently well-controlled  Elevated BPs: No history of HTN, but mild range pressures intermittently intrapartum -  UPC and HELLP labs WNL - CTM  Rodney Cruise, MD OB Fellow  02/06/2018, 11:10 AM

## 2018-02-06 NOTE — Op Note (Signed)
Cesarean Section Operative Report  Andrea [name]  PROCEDURE DATE: 02/06/2018  PREOPERATIVE DIAGNOSES: Intrauterine pregnancy at [redacted]w[redacted]d weeks gestation; failure to progress: arrest of dilation  POSTOPERATIVE DIAGNOSES: The same  PROCEDURE: Repeat Low Transverse Cesarean Section  SURGEON:   Surgeon(s) and Role:    * Eure, Amaryllis Dyke, MD - Primary    * Arvilla Market, DO - Assisting - OB Fellow   INDICATIONS: Andrea [name] is a 27 y.o. O9G2952 at [redacted]w[redacted]d here for cesarean section secondary to the indications listed under preoperative diagnoses; please see preoperative note for further details.  The risks of cesarean section were discussed with the patient including but were not limited to: bleeding which may require transfusion or reoperation; infection which may require antibiotics; injury to bowel, bladder, ureters or other surrounding organs; injury to the fetus; need for additional procedures including hysterectomy in the event of a life-threatening hemorrhage; placental abnormalities wth subsequent pregnancies, incisional problems, thromboembolic phenomenon and other postoperative/anesthesia complications.   The patient concurred with the proposed plan, giving informed written consent for the procedure.    FINDINGS:  Viable female infant in cephalic presentation.  Apgars 9 and 9.  Clear amniotic fluid.  Intact placenta, three vessel cord.  Normal uterus, fallopian tubes and ovaries bilaterally.  ANESTHESIA: Epidural INTRAVENOUS FLUIDS: 1807 mL  ESTIMATED BLOOD LOSS: 251 mL URINE OUTPUT:  351 ml SPECIMENS: Placenta sent to L&D COMPLICATIONS: None immediate  PROCEDURE IN DETAIL:  The patient preoperatively received intravenous antibiotics and had sequential compression devices applied to her lower extremities.  She was then taken to the operating room where the epidural anesthesia was dosed up to surgical level and was found to be adequate. She was then placed in a dorsal supine  position with a leftward tilt, and prepped and draped in a sterile manner.  A foley catheter was placed into her bladder and attached to constant gravity.     A time out was held and the above information confirmed.  A transverse was made and carried down through the subcutaneous tissue to the fascia. Fascial incision was made and extended transversely. The fascia was separated from the underlying rectus tissue superiorly and inferiorly. The peritoneum was identified and entered. Peritoneal incision was extended longitudinally. The utero-vesical peritoneal reflection was incised transversely and the bladder flap was bluntly freed from the lower uterine segment. A low transverse hysterotomy was made with a scalpel and extended bilaterally bluntly. Abdominal muscles were excised bilaterally to create more space. The infant was successfully delivered vertex with vacuum assistance, the cord was clamped and cut after one minute, and the infant was handed over to the awaiting neonatology team. Cord pH and cord blood was obtained for evaluation. The placenta was delivered intact and appeared normal. The uterine outline, tubes and ovaries appeared normal. The uterine incision was closed with running locked sutures of 0 Monocryl, and an imbricating layer was also placed with running locked 0 Monocryl. Hemostasis was observed. Lavage was carried out until clear. The fascia was then closed using 0 Vicryl in a running fashion.  The subcutaneous layer was irrigated, then reapproximated with 2-0 plain gut interrupted stitches, and  the skin was closed with a 4-0 Vicryl subcuticular stitch.    Disposition: PACU - hemodynamically stable.   Maternal Condition: stable   Marcy Siren, D.O. OB Fellow  02/06/2018, 1:46 PM

## 2018-02-06 NOTE — Addendum Note (Signed)
Addendum  created 02/06/18 1812 by Chassidy Layson O, CRNA   Sign clinical note    

## 2018-02-06 NOTE — Transfer of Care (Signed)
2Immediate Anesthesia Transfer of Care Note  Patient: Andrea [name]  Procedure(s) Performed: CESAREAN SECTION (N/A Abdomen)  Patient Location: PACU  Anesthesia Type:Epidural  Level of Consciousness: awake, alert  and oriented  Airway & Oxygen Therapy: Patient Spontanous Breathing  Post-op Assessment: Report given to RN and Post -op Vital signs reviewed and stable  Post vital signs: Reviewed and stable  Last Vitals:  Vitals Value Taken Time  BP 133/83 02/06/2018 12:50 PM  Temp    Pulse 77 02/06/2018 12:51 PM  Resp    SpO2 100 % 02/06/2018 12:51 PM  Vitals shown include unvalidated device data.  Last Pain:  Vitals:   02/06/18 1030  TempSrc:   PainSc: 0-No pain         Complications: No apparent anesthesia complications

## 2018-02-06 NOTE — Progress Notes (Signed)
LABOR PROGRESS NOTE  Andrea [name] is a 27 y.o. G2P0101 at [redacted]w[redacted]d  admitted for IOL T2DM  Subjective: Feeling more pressure with contractions  Objective: BP 131/78   Pulse 81   Temp 99.3 F (37.4 C) (Oral)   Resp 18   Ht 5\' 1"  (1.549 m)   Wt 120.2 kg   LMP 05/08/2017 (Exact Date)   SpO2 100%   BMI 50.05 kg/m  or  Vitals:   02/06/18 0300 02/06/18 0330 02/06/18 0400 02/06/18 0415  BP: 125/73 104/67 131/78   Pulse: 79 75 81   Resp: 18 16 18    Temp:    99.3 F (37.4 C)  TempSrc:    Oral  SpO2:      Weight:      Height:        Dilation: 9 Effacement (%): 90 Cervical Position: Middle Station: -1 Presentation: Vertex Exam by:: Dr Janina Mayo: baseline rate 150, moderate varibility, + acel, variable decel Toco: irregular contractions; difficult to trace  Labs: Lab Results  Component Value Date   WBC 9.3 02/05/2018   HGB 11.2 (L) 02/05/2018   HCT 32.6 (L) 02/05/2018   MCV 75.5 (L) 02/05/2018   PLT 339 02/05/2018    Patient Active Problem List   Diagnosis Date Noted  . Pregestational diabetes mellitus, modified White class B 02/05/2018  . Group B Streptococcus carrier, +RV culture, currently pregnant 02/05/2018  . Obesity in pregnancy 08/23/2017  . History of preterm delivery, currently pregnant 07/26/2017  . Supervision of other high risk pregnancy, antepartum 07/26/2017  . Healthcare maintenance 06/01/2016  . BMI 40.0-44.9, adult (HCC) 02/25/2016  . Previous cesarean delivery affecting pregnancy 09/17/2015  . Preexisting diabetes complicating pregnancy, antepartum 04/15/2015  . Diabetes mellitus type 2, uncontrolled, without complications (HCC) 10/31/2012    Assessment / Plan: 27 y.o. G2P0101 at [redacted]w[redacted]d here for IOL for T2DM  Labor: Place IUPC now. Continue pitocin  Fetal Wellbeing:  Cat 2 strip (reassuring) Pain Control:  Epidural Anticipated MOD:  SVD  Gwenevere Abbot, MD OB Fellow  02/06/2018, 4:38 AM

## 2018-02-06 NOTE — Progress Notes (Signed)
LABOR PROGRESS NOTE  Andrea [name] is a 27 y.o. G2P0101 at [redacted]w[redacted]d  admitted for IOL T2DM  Subjective: Comfortable with epidural   Objective: BP (!) 148/88   Pulse 84   Temp 98.9 F (37.2 C) (Oral)   Resp 18   Ht 5\' 1"  (1.549 m)   Wt 120.2 kg   LMP 05/08/2017 (Exact Date)   SpO2 100%   BMI 50.05 kg/m  or  Vitals:   02/05/18 2300 02/05/18 2330 02/06/18 0001 02/06/18 0030  BP: (!) 120/94 113/69 132/67 (!) 148/88  Pulse: (!) 108 87 80 84  Resp:  17 18   Temp:   98.9 F (37.2 C)   TempSrc:   Oral   SpO2:      Weight:      Height:         Dilation: 7 Effacement (%): 70 Cervical Position: Middle Station: -2 Presentation: Vertex Exam by:: Dr Janina Mayo: baseline rate 140s, moderate varibility, + acel, occasional variable decels Toco: regular contractions Q3-4 minutes  Labs: Lab Results  Component Value Date   WBC 9.3 02/05/2018   HGB 11.2 (L) 02/05/2018   HCT 32.6 (L) 02/05/2018   MCV 75.5 (L) 02/05/2018   PLT 339 02/05/2018    Patient Active Problem List   Diagnosis Date Noted  . Pregestational diabetes mellitus, modified White class B 02/05/2018  . Group B Streptococcus carrier, +RV culture, currently pregnant 02/05/2018  . Obesity in pregnancy 08/23/2017  . History of preterm delivery, currently pregnant 07/26/2017  . Supervision of other high risk pregnancy, antepartum 07/26/2017  . Healthcare maintenance 06/01/2016  . BMI 40.0-44.9, adult (HCC) 02/25/2016  . Previous cesarean delivery affecting pregnancy 09/17/2015  . Preexisting diabetes complicating pregnancy, antepartum 04/15/2015  . Diabetes mellitus type 2, uncontrolled, without complications (HCC) 10/31/2012    Assessment / Plan: 27 y.o. G2P0101 at [redacted]w[redacted]d here for IOL for T2DM  Labor: continue pitocin  Fetal Wellbeing:  Cat 2 (reassuring) Pain Control:  Epidural  Anticipated MOD:  SVD  Gwenevere Abbot, MD OB Fellow  02/06/2018, 1:00 AM

## 2018-02-07 ENCOUNTER — Other Ambulatory Visit: Payer: Self-pay

## 2018-02-07 ENCOUNTER — Encounter: Payer: Self-pay | Admitting: *Deleted

## 2018-02-07 LAB — CBC
HCT: 29.3 % — ABNORMAL LOW (ref 36.0–46.0)
Hemoglobin: 10.1 g/dL — ABNORMAL LOW (ref 12.0–15.0)
MCH: 26 pg (ref 26.0–34.0)
MCHC: 34.5 g/dL (ref 30.0–36.0)
MCV: 75.5 fL — ABNORMAL LOW (ref 80.0–100.0)
Platelets: 246 10*3/uL (ref 150–400)
RBC: 3.88 MIL/uL (ref 3.87–5.11)
RDW: 17 % — AB (ref 11.5–15.5)
WBC: 10.1 10*3/uL (ref 4.0–10.5)
nRBC: 0 % (ref 0.0–0.2)

## 2018-02-07 LAB — GLUCOSE, CAPILLARY
GLUCOSE-CAPILLARY: 181 mg/dL — AB (ref 70–99)
GLUCOSE-CAPILLARY: 81 mg/dL (ref 70–99)
GLUCOSE-CAPILLARY: 87 mg/dL (ref 70–99)
Glucose-Capillary: 75 mg/dL (ref 70–99)
Glucose-Capillary: 78 mg/dL (ref 70–99)

## 2018-02-07 LAB — CREATININE, SERUM: Creatinine, Ser: 0.69 mg/dL (ref 0.44–1.00)

## 2018-02-07 MED ORDER — ONDANSETRON HCL 4 MG/2ML IJ SOLN: 4.0000 mg | Freq: Three times a day (TID) | INTRAMUSCULAR | Status: DC | PRN

## 2018-02-07 MED ORDER — SODIUM CHLORIDE 0.9% FLUSH: 3.0000 mL | INTRAVENOUS | Status: DC | PRN

## 2018-02-07 MED ORDER — NALBUPHINE HCL 10 MG/ML IJ SOLN: 5.0000 mg | INTRAMUSCULAR | Status: DC | PRN

## 2018-02-07 MED ORDER — NALBUPHINE HCL 10 MG/ML IJ SOLN: 5.0000 mg | Freq: Once | INTRAMUSCULAR | Status: DC | PRN

## 2018-02-07 MED ORDER — NALOXONE HCL 0.4 MG/ML IJ SOLN: 0.4000 mg | INTRAMUSCULAR | Status: DC | PRN

## 2018-02-07 MED ORDER — DIPHENHYDRAMINE HCL 25 MG PO CAPS: 25.0000 mg | ORAL_CAPSULE | ORAL | Status: DC | PRN

## 2018-02-07 MED ORDER — DIPHENHYDRAMINE HCL 50 MG/ML IJ SOLN: 12.5000 mg | INTRAMUSCULAR | Status: DC | PRN

## 2018-02-07 MED ORDER — SCOPOLAMINE 1 MG/3DAYS TD PT72: 1.0000 | MEDICATED_PATCH | Freq: Once | TRANSDERMAL | Status: DC

## 2018-02-07 MED ORDER — NALOXONE HCL 4 MG/10ML IJ SOLN
1.0000 ug/kg/h | INTRAVENOUS | Status: DC | PRN
  Filled 2018-02-07: qty 5

## 2018-02-07 NOTE — Progress Notes (Signed)
Pt IV infiltrated. Discussed restarting IV, pt refrused. Left foley catheter in place and encouraged pt to push oral fluids. Discussed if output was not adequate, will need to restart IV.

## 2018-02-07 NOTE — Progress Notes (Addendum)
POSTPARTUM PROGRESS NOTE  POD #1  Subjective:  Andrea [name] is a 27 y.o. [id number] s/p rLTCS at [redacted]w[redacted]d for failure to progress.  She reports she doing well. No acute events overnight. She denies any problems with ambulating, voiding or po intake. Denies nausea or vomiting. She has not yet had a bowel movement but she is passing gas. Denies HA, dizziness, shortness of breath, and calf pain. Pain is well controlled.  Lochia is moderate.  Objective: Blood pressure (!) 131/91, pulse 71, temperature 98.2 F (36.8 C), temperature source Oral, resp. rate 16, height 5\' 1"  (1.549 m), weight 120.2 kg, last menstrual period 05/08/2017, SpO2 100 %, unknown if currently breastfeeding.  Physical Exam:  General: alert, cooperative and no distress Chest: no respiratory distress Heart:regular rate, distal pulses intact Abdomen: soft, tender around incision Uterine Fundus: firm, appropriately tender DVT Evaluation: No calf swelling or tenderness Extremities: no edema Skin: warm, dry; incision clean/dry/intact w/ honeycomb dressing in place  Recent Labs    02/06/18 1431 02/07/18 0520  HGB 11.5* 10.1*  HCT 34.4* 29.3*    Assessment/Plan: Andrea [name] is a 27 y.o. [id number] s/p rLTCSat [redacted]w[redacted]d for failure to progress.  POD#1 - Doing welll; pain well controlled. H/H appropriate  Routine postpartum care  OOB, ambulated  Lovenox for VTE prophylaxis Anemia: asymptomatic  Start po ferrous sulfate BID  Contraception: no plan as stated by patient Feeding: bottle and breast feed  Dispo: Plan for discharge tomorrow.   LOS: 2 days   Avie Arenas, PA-S  02/07/2018, 7:40 AM   I confirm that I have verified the information documented in the PA student's note and that I have also personally performed the physical exam and all medical decision making activities.  Clayton Bibles, [place] 02/07/18  [phone]

## 2018-02-08 LAB — GLUCOSE, CAPILLARY
GLUCOSE-CAPILLARY: 74 mg/dL (ref 70–99)
GLUCOSE-CAPILLARY: 61 mg/dL — AB (ref 70–99)
Glucose-Capillary: 113 mg/dL — ABNORMAL HIGH (ref 70–99)
Glucose-Capillary: 93 mg/dL (ref 70–99)

## 2018-02-08 NOTE — Progress Notes (Signed)
POSTPARTUM PROGRESS NOTE  POD #2  Subjective:  Andrea [name] is a 27 y.o. [id number] s/p rLTCS at [redacted]w[redacted]d for failure to progress. No acute events overnight. She reports she is doing well. She denies any problems with ambulating, or po intake. Urinating normally, no BM but passing flatus. Denies nausea or vomiting. Pain is moderately controlled with tylenol and ibuprofen.  Lochia is progressively decreasing, at level with normal menstruation.   Objective: Blood pressure 130/80, pulse 72, temperature 97.6 F (36.4 C), temperature source Oral, resp. rate 18, height 5\' 1"  (1.549 m), weight 120.2 kg, last menstrual period 05/08/2017, SpO2 98 %, unknown if currently breastfeeding.  Physical Exam:  General: alert, cooperative and no distress Chest: no respiratory distress Heart:regular rate, distal pulses intact Abdomen: soft, nontender,  Uterine Fundus: firm, appropriately tender DVT Evaluation: No calf swelling or tenderness Extremities: no LE edema Skin: warm, dry; incision clean/dry/intact w/ honeycomb dressing in place  Recent Labs    02/06/18 1431 02/07/18 0520  HGB 11.5* 10.1*  HCT 34.4* 29.3*    Assessment/Plan: Andrea [name] is a 27 y.o. [id number] s/p rLTCS at [redacted]w[redacted]d for failure to progress.  POD#2 - Doing welll; pain moderately controlled. H/H appropriate  Routine postpartum care  OOB, ambulated  Lovenox for VTE prophylaxis T2DM: Glargine 40U nightly, SSI  BG overall well-controlled. Will follow-up at [facility] for diabetes management.  Anemia: asymptomatic  Start po ferrous sulfate BID Contraception: Undecided, does not want to discuss it. Indicates she "loves children" and does not like hormones. Discussed copper IUD as an option. Feeding: bottle  Dispo: Plan for discharge tomorrow.   LOS: 3 days   Milbert Coulter MS3 02/08/2018, 7:23 AM  Attestation: I have seen this patient and agree with the medical student's documentation. I have  examined them separately, and we have discussed the plan of care.  Cristal Deer. Earlene Plater, DO OB/GYN Fellow

## 2018-02-08 NOTE — Progress Notes (Signed)
Hypoglycemic Event  CBG: 61    Treatment: Patient to eat meal, which is in the room at this time  Symptoms: none  Follow-up CBG: Time: 1630 CBG Result:  Possible Reasons for Event: Unknown  Comments/MD notified:Dr. Annia Friendly notified and instructed RN to recheck CBG in one hour after eating (no need to check in 15 minutes).    Andrea [name]

## 2018-02-09 LAB — GLUCOSE, CAPILLARY: GLUCOSE-CAPILLARY: 99 mg/dL (ref 70–99)

## 2018-02-09 MED ORDER — OXYCODONE HCL 5 MG PO TABS: 5.0000 mg | ORAL_TABLET | ORAL | 0 refills | Status: DC | PRN

## 2018-02-09 MED ORDER — IBUPROFEN 600 MG PO TABS: 600.0000 mg | ORAL_TABLET | Freq: Four times a day (QID) | ORAL | 0 refills | Status: DC

## 2018-02-09 NOTE — Discharge Summary (Signed)
Postpartum Discharge Summary     Patient Name: Andrea [name] DOB: 1990-06-26 MRN: [id number]  Date of admission: 02/05/2018 Delivering Provider: Lazaro [name]   Date of discharge: 02/09/2018  Admitting diagnosis: 39 WKS INDUCTION Intrauterine pregnancy: [redacted]w[redacted]d     Secondary diagnosis:  Active Problems:   Diabetes mellitus type 2, uncontrolled, without complications (HCC)   Previous cesarean delivery affecting pregnancy   BMI 40.0-44.9, adult (HCC)   Obesity in pregnancy   Pregestational diabetes mellitus, modified White class B   Group B Streptococcus carrier, +RV culture, currently pregnant  Additional problems: none     Discharge diagnosis: Term Pregnancy Delivered                                                                                                Post partum procedures:none  Augmentation: AROM, Pitocin and Foley Balloon  Complications: None  Hospital course:  Onset of Labor With Unplanned C/S  27 y.o. yo [id number] at [redacted]w[redacted]d was admitted in Latent Labor on 02/05/2018. Patient had a labor course significant for arrest of dilation leading to repeat LTCS. Membrane Rupture Time/Date: 7:56 PM ,02/05/2018   The patient went for cesarean section due to Arrest of Dilation and Arrest of Descent, and delivered a Viable infant,02/06/2018  Details of operation can be found in separate operative note. Patient had an uncomplicated postpartum course.  She is ambulating,tolerating a regular diet, passing flatus, and urinating well.  Patient is discharged home in stable condition 02/09/18.  Magnesium Sulfate recieved: No BMZ received: No  Physical exam  Vitals:   02/08/18 0505 02/08/18 1519 02/08/18 2240 02/09/18 0715  BP: 130/80 (!) 142/77 (!) 141/95 134/73  Pulse: 72 84 79 85  Resp: 18 17 20 18   Temp: 97.6 F (36.4 C) 99 F (37.2 C) 98.4 F (36.9 C) 98.4 F (36.9 C)  TempSrc: Oral Oral Oral Oral  SpO2: 98%   100%  Weight:      Height:       General: alert,  cooperative and no distress Lochia: appropriate Uterine Fundus: firm Incision: Healing well with no significant drainage, dried blood saturating ~75% of dressing DVT Evaluation: No evidence of DVT seen on physical exam. Labs: Lab Results  Component Value Date   WBC 10.1 02/07/2018   HGB 10.1 (L) 02/07/2018   HCT 29.3 (L) 02/07/2018   MCV 75.5 (L) 02/07/2018   PLT 246 02/07/2018   CMP Latest Ref Rng & Units 02/07/2018  Glucose 70 - 99 mg/dL -  BUN 6 - 20 mg/dL -  Creatinine 0.86 - 5.78 mg/dL 4.69  Sodium 629 - 528 mmol/L -  Potassium 3.5 - 5.1 mmol/L -  Chloride 98 - 111 mmol/L -  CO2 22 - 32 mmol/L -  Calcium 8.9 - 10.3 mg/dL -  Total Protein 6.5 - 8.1 g/dL -  Total Bilirubin 0.3 - 1.2 mg/dL -  Alkaline Phos 38 - 413 U/L -  AST 15 - 41 U/L -  ALT 0 - 44 U/L -    Discharge instruction: per After Visit Summary and "Baby and Me Booklet".  After  visit meds:  Allergies as of 02/09/2018      Reactions   Metformin And Related Diarrhea      Medication List    TAKE these medications   aspirin EC 81 MG tablet Take 1 tablet (81 mg total) by mouth daily. Start at 12 weeks (07/31/17)   ibuprofen 600 MG tablet Commonly known as:  ADVIL,MOTRIN Take 1 tablet (600 mg total) by mouth every 6 (six) hours.   Insulin Glargine 100 UNIT/ML Solostar Pen Commonly known as:  LANTUS Inject 55 Units into the skin daily at 10 pm.   oxyCODONE 5 MG immediate release tablet Commonly known as:  Oxy IR/ROXICODONE Take 1 tablet (5 mg total) by mouth every 4 (four) hours as needed (pain scale 4-7).   prenatal multivitamin Tabs tablet Take 1 tablet by mouth at bedtime.       Diet: low salt diet  Activity: Advance as tolerated. Pelvic rest for 6 weeks.   Outpatient follow up:1, 2 and 6 weeks Follow up Appt: Future Appointments  Date Time Provider Department Center  02/22/2018 10:30 AM [facility] NURSE [facility]  03/22/2018  8:15 AM Andrea Bowens, MD [facility]  03/22/2018  8:50  AM [facility] LAB [facility]   Newborn Data: Live born female  Birth Weight: 6 lb 15.6 oz (3165 g) APGAR: 9, 9  Newborn Delivery   Birth date/time:  02/06/2018 11:36:00 Delivery type:  C-Section, Vacuum Assisted Trial of labor:  Yes C-section categorization:  Repeat     Baby Feeding: Bottle and Breast Disposition:home with mother   02/09/2018 Gwenevere Abbot, MD

## 2018-02-09 NOTE — Progress Notes (Signed)
POSTPARTUM PROGRESS NOTE  POD #3  Subjective:  Andrea [name] is a 27 y.o. [id number] s/p rLTCS at [redacted]w[redacted]d.  She reports she doing well. No acute events overnight. She denies any problems with ambulating, voiding or po intake. Denies nausea or vomiting. She has passed flatus. Pain is well controlled.  Lochia is mild.  Objective: Blood pressure 134/73, pulse 85, temperature 98.4 F (36.9 C), temperature source Oral, resp. rate 18, height 5\' 1"  (1.549 m), weight 120.2 kg, last menstrual period 05/08/2017, SpO2 100 %, unknown if currently breastfeeding.  Physical Exam:  General: alert, cooperative and no distress Chest: no respiratory distress Heart:regular rate, distal pulses intact Abdomen: soft, nontender,  Uterine Fundus: firm, appropriately tender DVT Evaluation: No calf swelling or tenderness Extremities: no edema Skin: warm, dry; incision clean/dry/intact w/ honeycomb dressing in place, very tender to palpation  Recent Labs    02/06/18 1431 02/07/18 0520  HGB 11.5* 10.1*  HCT 34.4* 29.3*    Assessment/Plan: Andrea [name] is a 27 y.o. [id number] s/p rLTCS at [redacted]w[redacted]d for failure to progress.  POD#3 - Doing welll; pain well controlled. H/H appropriate  Routine postpartum care  OOB, ambulated  Lovenox for VTE prophylaxis Anemia: asymptomatic  Start po ferrous sulfate daily  Contraception: declines Feeding: bottle and breast feed  Dispo: Plan for discharge today.   LOS: 4 days   Avie Arenas, PA-S 02/09/2018, 7:42 AM\  OB FELLOW POSTPARTUM PROGRESS NOTE ATTESTATION  I have seen and examined this patient and agree with above documentation in the student's note.   Gwenevere Abbot, MD OB Fellow  02/09/2018, 9:48 AM

## 2018-02-18 ENCOUNTER — Encounter (HOSPITAL_COMMUNITY): Payer: Self-pay | Admitting: *Deleted

## 2018-02-18 ENCOUNTER — Inpatient Hospital Stay (HOSPITAL_COMMUNITY)
Admission: AD | Admit: 2018-02-18 | Discharge: 2018-02-18 | Disposition: A | Discharge status: Home / Self Care | Payer: Medicaid Other | Source: Ambulatory Visit | Attending: Obstetrics and Gynecology | Admitting: Obstetrics and Gynecology

## 2018-02-18 DIAGNOSIS — O9089 Other complications of the puerperium, not elsewhere classified: Secondary | ICD-10-CM | POA: Diagnosis present

## 2018-02-18 DIAGNOSIS — O165 Unspecified maternal hypertension, complicating the puerperium: Secondary | ICD-10-CM

## 2018-02-18 DIAGNOSIS — Z4889 Encounter for other specified surgical aftercare: Secondary | ICD-10-CM

## 2018-02-18 LAB — URINALYSIS, ROUTINE W REFLEX MICROSCOPIC
BILIRUBIN URINE: NEGATIVE
GLUCOSE, UA: NEGATIVE mg/dL
Ketones, ur: 5 mg/dL — AB
NITRITE: NEGATIVE
PH: 5 (ref 5.0–8.0)
Protein, ur: NEGATIVE mg/dL
SPECIFIC GRAVITY, URINE: 1.015 (ref 1.005–1.030)

## 2018-02-18 LAB — CBC
HCT: 34.8 % — ABNORMAL LOW (ref 36.0–46.0)
HEMOGLOBIN: 11.2 g/dL — AB (ref 12.0–15.0)
MCH: 25.5 pg — AB (ref 26.0–34.0)
MCHC: 32.2 g/dL (ref 30.0–36.0)
MCV: 79.1 fL — ABNORMAL LOW (ref 80.0–100.0)
PLATELETS: 443 10*3/uL — AB (ref 150–400)
RBC: 4.4 MIL/uL (ref 3.87–5.11)
RDW: 16.2 % — ABNORMAL HIGH (ref 11.5–15.5)
WBC: 5.8 10*3/uL (ref 4.0–10.5)
nRBC: 0 % (ref 0.0–0.2)

## 2018-02-18 LAB — COMPREHENSIVE METABOLIC PANEL
ALK PHOS: 75 U/L (ref 38–126)
ALT: 16 U/L (ref 0–44)
AST: 18 U/L (ref 15–41)
Albumin: 3.6 g/dL (ref 3.5–5.0)
Anion gap: 9 (ref 5–15)
BUN: 6 mg/dL (ref 6–20)
CALCIUM: 8.8 mg/dL — AB (ref 8.9–10.3)
CO2: 24 mmol/L (ref 22–32)
CREATININE: 0.69 mg/dL (ref 0.44–1.00)
Chloride: 106 mmol/L (ref 98–111)
GFR calc non Af Amer: >60 mL/min (ref >60)
Glucose, Bld: 51 mg/dL — ABNORMAL LOW (ref 70–99)
Potassium: 3.7 mmol/L (ref 3.5–5.1)
SODIUM: 139 mmol/L (ref 135–145)
Total Bilirubin: 0.8 mg/dL (ref 0.3–1.2)
Total Protein: 7.5 g/dL (ref 6.5–8.1)

## 2018-02-18 LAB — PROTEIN / CREATININE RATIO, URINE
CREATININE, URINE: 222 mg/dL
Protein Creatinine Ratio: 0.05 mg/mg{Cre} (ref 0.00–0.15)
Total Protein, Urine: 11 mg/dL

## 2018-02-18 MED ORDER — LABETALOL HCL 5 MG/ML IV SOLN: 40.0000 mg | INTRAVENOUS | Status: DC | PRN

## 2018-02-18 MED ORDER — HYDRALAZINE HCL 20 MG/ML IJ SOLN: 10.0000 mg | INTRAMUSCULAR | Status: DC | PRN

## 2018-02-18 MED ORDER — LABETALOL HCL 200 MG PO TABS: 200.0000 mg | ORAL_TABLET | Freq: Two times a day (BID) | ORAL | 0 refills | Status: DC

## 2018-02-18 MED ORDER — LABETALOL HCL 5 MG/ML IV SOLN: 80.0000 mg | INTRAVENOUS | Status: DC | PRN

## 2018-02-18 MED ORDER — LABETALOL HCL 5 MG/ML IV SOLN
20.0000 mg | INTRAVENOUS | Status: DC | PRN
  Administered 2018-02-18: 20 mg via INTRAVENOUS
  Filled 2018-02-18: qty 4

## 2018-02-18 MED ORDER — LACTATED RINGERS IV SOLN
INTRAVENOUS | Status: DC
  Administered 2018-02-18: 15:00:00 via INTRAVENOUS

## 2018-02-18 NOTE — MAU Note (Signed)
Pt had C/S on Oct 27, was seen by home nurse, was told her incision is red and "open a little bit," also was told her BP was 170/90 & 160/100.   Pt denies drainage from her incision or fever.  Pt states she had a HA 2-3 days ago, none today.  Denies visual changes or epigastric pain.

## 2018-02-18 NOTE — Discharge Instructions (Signed)
Hypertension During Pregnancy °Hypertension, commonly called high blood pressure, is when the force of blood pumping through your arteries is too strong. Arteries are blood vessels that carry blood from the heart throughout the body. Hypertension during pregnancy can cause problems for you and your baby. Your baby may be born early (prematurely) or may not weigh as much as he or she should at birth. Very bad cases of hypertension during pregnancy can be life-threatening. °Different types of hypertension can occur during pregnancy. These include: °· Chronic hypertension. This happens when: °? You have hypertension before pregnancy and it continues during pregnancy. °? You develop hypertension before you are [redacted] weeks pregnant, and it continues during pregnancy. °· Gestational hypertension. This is hypertension that develops after the 20th week of pregnancy. °· Preeclampsia, also called toxemia of pregnancy. This is a very serious type of hypertension that develops only during pregnancy. It affects the whole body, and it can be very dangerous for you and your baby. ° °Gestational hypertension and preeclampsia usually go away within 6 weeks after your baby is born. Women who have hypertension during pregnancy have a greater chance of developing hypertension later in life or during future pregnancies. °What are the causes? °The exact cause of hypertension is not known. °What increases the risk? °There are certain factors that make it more likely for you to develop hypertension during pregnancy. These include: °· Having hypertension during a previous pregnancy or prior to pregnancy. °· Being overweight. °· Being older than age 40. °· Being pregnant for the first time or being pregnant with more than one baby. °· Becoming pregnant using fertilization methods such as IVF (in vitro fertilization). °· Having diabetes, kidney problems, or systemic lupus erythematosus. °· Having a family history of hypertension. ° °What are the  signs or symptoms? °Chronic hypertension and gestational hypertension rarely cause symptoms. Preeclampsia causes symptoms, which may include: °· Increased protein in your urine. Your health care provider will check for this at every visit before you give birth (prenatal visit). °· Severe headaches. °· Sudden weight gain. °· Swelling of the hands, face, legs, and feet. °· Nausea and vomiting. °· Vision problems, such as blurred or double vision. °· Numbness in the face, arms, legs, and feet. °· Dizziness. °· Slurred speech. °· Sensitivity to bright lights. °· Abdominal pain. °· Convulsions. ° °How is this diagnosed? °You may be diagnosed with hypertension during a routine prenatal exam. At each prenatal visit, you may: °· Have a urine test to check for high amounts of protein in your urine. °· Have your blood pressure checked. A blood pressure reading is recorded as two numbers, such as "120 over 80" (or 120/80). The first ("top") number is called the systolic pressure. It is a measure of the pressure in your arteries when your heart beats. The second ("bottom") number is called the diastolic pressure. It is a measure of the pressure in your arteries as your heart relaxes between beats. Blood pressure is measured in a unit called mm Hg. A normal blood pressure reading is: °? Systolic: below 120. °? Diastolic: below 80. ° °The type of hypertension that you are diagnosed with depends on your test results and when your symptoms developed. °· Chronic hypertension is usually diagnosed before 20 weeks of pregnancy. °· Gestational hypertension is usually diagnosed after 20 weeks of pregnancy. °· Hypertension with high amounts of protein in the urine is diagnosed as preeclampsia. °· Blood pressure measurements that stay above 160 systolic, or above 110 diastolic, are   signs of severe preeclampsia. ° °How is this treated? °Treatment for hypertension during pregnancy varies depending on the type of hypertension you have and how  serious it is. °· If you take medicines called ACE inhibitors to treat chronic hypertension, you may need to switch medicines. ACE inhibitors should not be taken during pregnancy. °· If you have gestational hypertension, you may need to take blood pressure medicine. °· If you are at risk for preeclampsia, your health care provider may recommend that you take a low-dose aspirin every day to prevent high blood pressure during your pregnancy. °· If you have severe preeclampsia, you may need to be hospitalized so you and your baby can be monitored closely. You may also need to take medicine (magnesium sulfate) to prevent seizures and to lower blood pressure. This medicine may be given as an injection or through an IV tube. °· In some cases, if your condition gets worse, you may need to deliver your baby early. ° °Follow these instructions at home: °Eating and drinking °· Drink enough fluid to keep your urine clear or pale yellow. °· Eat a healthy diet that is low in salt (sodium). Do not add salt to your food. Check food labels to see how much sodium a food or beverage contains. °Lifestyle °· Do not use any products that contain nicotine or tobacco, such as cigarettes and e-cigarettes. If you need help quitting, ask your health care provider. °· Do not use alcohol. °· Avoid caffeine. °· Avoid stress as much as possible. Rest and get plenty of sleep. °General instructions °· Take over-the-counter and prescription medicines only as told by your health care provider. °· While lying down, lie on your left side. This keeps pressure off your baby. °· While sitting or lying down, raise (elevate) your feet. Try putting some pillows under your lower legs. °· Exercise regularly. Ask your health care provider what kinds of exercise are best for you. °· Keep all prenatal and follow-up visits as told by your health care provider. This is important. °Contact a health care provider if: °· You have symptoms that your health care  provider told you may require more treatment or monitoring, such as: °? Fever. °? Vomiting. °? Headache. °Get help right away if: °· You have severe abdominal pain or vomiting that does not get better with treatment. °· You suddenly develop swelling in your hands, ankles, or face. °· You gain 4 lbs (1.8 kg) or more in 1 week. °· You develop vaginal bleeding, or you have blood in your urine. °· You do not feel your baby moving as much as usual. °· You have blurred or double vision. °· You have muscle twitching or sudden tightening (spasms). °· You have shortness of breath. °· Your lips or fingernails turn blue. °This information is not intended to replace advice given to you by your health care provider. Make sure you discuss any questions you have with your health care provider. °Document Released: 12/16/2010 Document Revised: 10/18/2015 Document Reviewed: 09/13/2015 °[employer] Interactive Patient Education © 2018 [employer] ° °

## 2018-02-18 NOTE — MAU Provider Note (Signed)
History     CSN: [id number]  Arrival date and time: 02/18/18 1159   First Provider Initiated Contact with Patient 02/18/18 1228      Chief Complaint  Patient presents with  . Wound Check  . Hypertension   HPI Andrea [name] is a 27 y.o. [id number] postpartum from a cesarean section on 10/27 who presents for a incision check. She states the home nurse came to see her yesterday and said the incision was open. She denies any pain or discharge. Denies fevers. She also states she had elevated blood pressures yesterday when the nurse came. She denies any headache, visual changes or epigastric pain.   OB History    Gravida  2   Para  2   Term  1   Preterm  1   AB  0   Living  2     SAB  0   TAB  0   Ectopic  0   Multiple  0   Live Births  2           Past Medical History:  Diagnosis Date  . Anemia    as a teenager  . Chicken pox   . Diabetes mellitus    type 2 on insulin  . Impacted teeth with abnormal position    impacted wisdom teeth    Past Surgical History:  Procedure Laterality Date  . CESAREAN SECTION N/A 09/16/2015   Procedure: CESAREAN SECTION;  Surgeon: Lesly Dukes, MD;  Location: [facility] BIRTHING SUITES;  Service: Obstetrics;  Laterality: N/A;  . CESAREAN SECTION N/A 02/06/2018   Procedure: CESAREAN SECTION;  Surgeon: Lazaro Arms, MD;  Location: [facility] BIRTHING SUITES;  Service: Obstetrics;  Laterality: N/A;  . FOREIGN BODY REMOVAL Left 08/28/2015   Procedure: REMOVAL FOREIGN BODY foot;  Surgeon: Dannielle Huh, MD;  Location: [facility] OR;  Service: Orthopedics;  Laterality: Left;  regional block  . NO PAST SURGERIES    . TOOTH EXTRACTION N/A 07/24/2016   Procedure: EXTRACTION WISDOM TEETH ONE, SIXTEEN, SEVENTEEN AND THIRTY-TWO;  Surgeon: Ocie Doyne, DDS;  Location: [facility] OR;  Service: Oral Surgery;  Laterality: N/A;    Family History  Problem Relation Age of Onset  . Stroke Father   . Hypertension Father   . Diabetes Mellitus II Father   . Irregular  heart beat Father   . Heart disease Father   . Hyperlipidemia Father   . Diabetes Mellitus II Mother   . Hyperlipidemia Mother   . Thyroid disease Mother   . Hypertension Maternal Aunt   . Thyroid disease Maternal Aunt   . Diabetes Mellitus II Maternal Grandmother   . Anesthesia problems Neg Hx     Social History   Tobacco Use  . Smoking status: Never Smoker  . Smokeless tobacco: Never Used  Substance Use Topics  . Alcohol use: Not Currently    Comment: rare  . Drug use: No    Allergies:  Allergies  Allergen Reactions  . Metformin And Related Diarrhea    Medications Prior to Admission  Medication Sig Dispense Refill Last Dose  . aspirin EC 81 MG tablet Take 1 tablet (81 mg total) by mouth daily. Start at 12 weeks (07/31/17) 30 tablet 11 02/04/2018 at Unknown time  . ibuprofen (ADVIL,MOTRIN) 600 MG tablet Take 1 tablet (600 mg total) by mouth every 6 (six) hours. 30 tablet 0   . Insulin Glargine (LANTUS SOLOSTAR) 100 UNIT/ML Solostar Pen Inject 55 Units into the skin daily at 10  pm. 5 pen 10 02/04/2018 at Unknown time  . oxyCODONE (OXY IR/ROXICODONE) 5 MG immediate release tablet Take 1 tablet (5 mg total) by mouth every 4 (four) hours as needed (pain scale 4-7). 10 tablet 0   . Prenatal Vit-Fe Fumarate-FA (PRENATAL MULTIVITAMIN) TABS tablet Take 1 tablet by mouth at bedtime.   02/04/2018 at Unknown time    Review of Systems  Constitutional: Negative.  Negative for fatigue and fever.  HENT: Negative.   Respiratory: Negative.  Negative for shortness of breath.   Cardiovascular: Negative.  Negative for chest pain.  Gastrointestinal: Negative.  Negative for abdominal pain, constipation, diarrhea, nausea and vomiting.  Genitourinary: Positive for vaginal bleeding. Negative for dysuria.  Skin: Positive for wound.  Neurological: Negative.  Negative for dizziness and headaches.   Physical Exam   Blood pressure (!) 154/84, pulse 79, temperature 98.4 F (36.9 C), temperature  source Oral, resp. rate 16, weight 114.3 kg, unknown if currently breastfeeding.  Patient Vitals for the past 24 hrs:  BP Temp Temp src Pulse Resp Weight  02/18/18 1516 (!) 157/85 - - 76 - -  02/18/18 1501 (!) 157/83 - - 70 - -  02/18/18 1446 (!) 157/83 - - 74 - -  02/18/18 1436 (!) 159/79 - - 75 - -  02/18/18 1401 (!) 170/98 - - 78 - -  02/18/18 1346 (!) 158/89 - - 72 - -  02/18/18 1331 (!) 158/89 - - 73 - -  02/18/18 1316 (!) 154/92 - - 73 - -  02/18/18 1300 (!) 157/94 - - 92 - -  02/18/18 1246 (!) 160/97 - - 96 - -  02/18/18 1231 (!) 152/84 - - 78 - -  02/18/18 1218 (!) 154/84 98.4 F (36.9 C) Oral 79 16 -  02/18/18 1210 - - - - - 114.3 kg   Physical Exam  Nursing note and vitals reviewed. Constitutional: She is oriented to person, place, and time. She appears well-developed and well-nourished. No distress.  HENT:  Head: Normocephalic.  Eyes: Pupils are equal, round, and reactive to light.  Cardiovascular: Normal rate, regular rhythm and normal heart sounds.  Respiratory: Effort normal and breath sounds normal. No respiratory distress.  GI: Soft. Bowel sounds are normal. She exhibits no distension. There is no tenderness.  Neurological: She is alert and oriented to person, place, and time.  Skin: Skin is warm and dry.  Two small openings on cesarean incision noted. Both less than 1cm in size. No drainage or bleeding noted. No erythema or edema  Psychiatric: She has a normal mood and affect. Her behavior is normal. Judgment and thought content normal.    MAU Course  Procedures Results for orders placed or performed during the hospital encounter of 02/18/18 (from the past 24 hour(s))  CBC     Status: Abnormal   Collection Time: 02/18/18 12:39 PM  Result Value Ref Range   WBC 5.8 4.0 - 10.5 K/uL   RBC 4.40 3.87 - 5.11 MIL/uL   Hemoglobin 11.2 (L) 12.0 - 15.0 g/dL   HCT 08.6 (L) 57.8 - 46.9 %   MCV 79.1 (L) 80.0 - 100.0 fL   MCH 25.5 (L) 26.0 - 34.0 pg   MCHC 32.2 30.0 -  36.0 g/dL   RDW 62.9 (H) 52.8 - 41.3 %   Platelets 443 (H) 150 - 400 K/uL   nRBC 0.0 0.0 - 0.2 %  Comprehensive metabolic panel     Status: Abnormal   Collection Time: 02/18/18 12:39 PM  Result Value Ref Range   Sodium 139 135 - 145 mmol/L   Potassium 3.7 3.5 - 5.1 mmol/L   Chloride 106 98 - 111 mmol/L   CO2 24 22 - 32 mmol/L   Glucose, Bld 51 (L) 70 - 99 mg/dL   BUN 6 6 - 20 mg/dL   Creatinine, Ser 1.61 0.44 - 1.00 mg/dL   Calcium 8.8 (L) 8.9 - 10.3 mg/dL   Total Protein 7.5 6.5 - 8.1 g/dL   Albumin 3.6 3.5 - 5.0 g/dL   AST 18 15 - 41 U/L   ALT 16 0 - 44 U/L   Alkaline Phosphatase 75 38 - 126 U/L   Total Bilirubin 0.8 0.3 - 1.2 mg/dL   GFR calc non Af Amer >60 >60 mL/min   GFR calc Af Amer >60 >60 mL/min   Anion gap 9 5 - 15  Urinalysis, Routine w reflex microscopic     Status: Abnormal   Collection Time: 02/18/18  1:02 PM  Result Value Ref Range   Color, Urine YELLOW YELLOW   APPearance HAZY (A) CLEAR   Specific Gravity, Urine 1.015 1.005 - 1.030   pH 5.0 5.0 - 8.0   Glucose, UA NEGATIVE NEGATIVE mg/dL   Hgb urine dipstick LARGE (A) NEGATIVE   Bilirubin Urine NEGATIVE NEGATIVE   Ketones, ur 5 (A) NEGATIVE mg/dL   Protein, ur NEGATIVE NEGATIVE mg/dL   Nitrite NEGATIVE NEGATIVE   Leukocytes, UA SMALL (A) NEGATIVE   RBC / HPF 0-5 0 - 5 RBC/hpf   WBC, UA 6-10 0 - 5 WBC/hpf   Bacteria, UA RARE (A) NONE SEEN   Squamous Epithelial / LPF 0-5 0 - 5   Mucus PRESENT   Protein / creatinine ratio, urine     Status: None   Collection Time: 02/18/18  1:02 PM  Result Value Ref Range   Creatinine, Urine 222.00 mg/dL   Total Protein, Urine 11 mg/dL   Protein Creatinine Ratio 0.05 0.00 - 0.15 mg/mg[Cre]   MDM UA CBC, CMP, Protein/creat ratio Preeclampsia order set- 20mg  labetalol IV given  Lengthy discussion of proper wound care to keep incision clean and dry. Encouraged patient to have husband assist in monitoring for infection due to large panus and inability to see her  incision.  Consulted with Dr. Earlene Plater regarding 2 severe range BPs and lab results- will discharge patient home and start labetalol 200mg  BID. Patient has office visit scheduled for 11/12.  Assessment and Plan   1. Postpartum hypertension   2. Encounter for post surgical wound check    -Discharge home in stable condition -Wound infection precautions discussed -Strict preeclampsia precautions reviewed and lengthy discussion of warning signs -Patient advised to follow-up with [facility] on Tuesday as scheduled for a BP check -Patient may return to MAU as needed or if her condition were to change or worsen   Rolm Bookbinder CNM 02/18/2018, 12:28 PM

## 2018-02-18 NOTE — MAU Note (Signed)
IV attempt unsuccessful x 2, pt refuses IV start in her hand, 2nd RN to attempt.

## 2018-02-22 ENCOUNTER — Ambulatory Visit (INDEPENDENT_AMBULATORY_CARE_PROVIDER_SITE_OTHER): Payer: Medicaid Other | Admitting: General Practice

## 2018-02-22 VITALS — BP 142/94 | HR 89 | Ht 62.0 in | Wt 246.0 lb

## 2018-02-22 DIAGNOSIS — Z5189 Encounter for other specified aftercare: Secondary | ICD-10-CM

## 2018-02-22 NOTE — Progress Notes (Signed)
Patient presents to office today for wound check & BP check following c-section 2 weeks ago. Patient was seen in MAU 4 days ago for wound check and incidentally found to have pp htn. Patient was started on labetalol 200mg  BID.  Patient has not taken labetalol this morning because it makes her slightly drowsy. Patient denies headaches, dizziness or blurry vision. Incision is clean, dry, & intact. Two small areas, 1cm, of granulation tissue noted but appears to be healing well. Wound care and signs & symptoms of infection reviewed with patient. Reviewed patient's blood pressure with Luna KitchensKathryn Kooistra who states patient should continue BP medication & follow up at currently scheduled pp visit on 12/10. Discussed with patient. Patient verbalized understanding & had no questions.  Marylynn Pearsonarrie Hillman RN BSN 02/22/18

## 2018-02-22 NOTE — Progress Notes (Signed)
Chart reviewed for nurse visit. Agree with plan of care.   Marylene Land, CNM 02/22/2018 6:34 PM

## 2018-03-14 ENCOUNTER — Other Ambulatory Visit: Payer: Self-pay | Admitting: General Practice

## 2018-03-14 DIAGNOSIS — O24319 Unspecified pre-existing diabetes mellitus in pregnancy, unspecified trimester: Secondary | ICD-10-CM

## 2018-03-22 ENCOUNTER — Telehealth: Payer: Self-pay | Admitting: Family Medicine

## 2018-03-22 ENCOUNTER — Ambulatory Visit: Payer: Medicaid Other | Admitting: Obstetrics and Gynecology

## 2018-03-22 ENCOUNTER — Other Ambulatory Visit: Payer: Medicaid Other

## 2018-03-22 NOTE — Telephone Encounter (Signed)
Called pt to get her rescheduled for her missed pp visit and 2 hr gtt. Number was not a working number and pt does not have mychart. Letter mailed

## 2018-11-14 ENCOUNTER — Encounter: Payer: Self-pay | Admitting: Nurse Practitioner

## 2018-11-14 ENCOUNTER — Other Ambulatory Visit: Payer: Self-pay

## 2018-11-14 ENCOUNTER — Ambulatory Visit: Payer: Self-pay | Attending: Nurse Practitioner | Admitting: Nurse Practitioner

## 2018-11-14 VITALS — Ht 60.98 in | Wt 246.0 lb

## 2018-11-14 DIAGNOSIS — IMO0001 Reserved for inherently not codable concepts without codable children: Secondary | ICD-10-CM

## 2018-11-14 DIAGNOSIS — I10 Essential (primary) hypertension: Secondary | ICD-10-CM

## 2018-11-14 DIAGNOSIS — E1165 Type 2 diabetes mellitus with hyperglycemia: Secondary | ICD-10-CM

## 2018-11-14 DIAGNOSIS — G8929 Other chronic pain: Secondary | ICD-10-CM

## 2018-11-14 MED ORDER — TRUEPLUS LANCETS 28G MISC: 3 refills | Status: DC

## 2018-11-14 MED ORDER — IBUPROFEN 800 MG PO TABS: 800.0000 mg | ORAL_TABLET | Freq: Three times a day (TID) | ORAL | 1 refills | Status: DC | PRN

## 2018-11-14 MED ORDER — TRUE METRIX METER W/DEVICE KIT: PACK | 0 refills | Status: DC

## 2018-11-14 MED ORDER — TRUE METRIX BLOOD GLUCOSE TEST VI STRP: ORAL_STRIP | 12 refills | Status: DC

## 2018-11-14 MED ORDER — LANTUS SOLOSTAR 100 UNIT/ML ~~LOC~~ SOPN: 55.0000 [IU] | PEN_INJECTOR | Freq: Every day | SUBCUTANEOUS | 10 refills | Status: DC

## 2018-11-14 MED FILL — TRUE METRIX TEST STRIP: 50 days supply | Qty: 100 | Fill #0

## 2018-11-14 MED FILL — IBUPROFEN 800 MG TABLET: 800 | 20 days supply | Qty: 60 | Fill #0

## 2018-11-14 MED FILL — !TRUE METRIX BLOOD GLUCOSE: 1 days supply | Qty: 1 | Fill #0

## 2018-11-14 MED FILL — !LANTUS SOLOSTAR 100UNITS/M: 100 | 16 days supply | Qty: 9 | Fill #0

## 2018-11-14 MED FILL — TRUEplus LANCETS 28G MISC: 50 days supply | Qty: 100 | Fill #0

## 2018-11-14 NOTE — Progress Notes (Signed)
Virtual Visit via Telephone Note Due to national recommendations of social distancing due to [place] 19, telehealth visit is felt to be most appropriate for this patient at this time.  I discussed the limitations, risks, security and privacy concerns of performing an evaluation and management service by telephone and the availability of in person appointments. I also discussed with the patient that there may be a patient responsible charge related to this service. The patient expressed understanding and agreed to proceed.    I connected with Andrea [name] on 11/14/18  at   8:50 AM EDT  EDT by telephone and verified that I am speaking with the correct person using two identifiers.   Consent I discussed the limitations, risks, security and privacy concerns of performing an evaluation and management service by telephone and the availability of in person appointments. I also discussed with the patient that there may be a patient responsible charge related to this service. The patient expressed understanding and agreed to proceed.   Location of Patient: Private Residence   Location of Provider: [place] [facility] [employer] Office    Persons participating in Telemedicine visit: Andrea Rankins FNP-BC [place]    History of Present Illness: Telemedicine visit for: Andrea [name]  has a past medical history of Anemia, Chicken pox, Diabetes mellitus, and Impacted teeth with abnormal position.   Andrea [name] was a previous patient of another provider here with last office visit 07-2018 however at that time Andrea [name] found out Andrea [name] was pregnant and has been under the care of her gynecologist until last October when Andrea [name] gave birth to her son. Andrea [name] was started on labetalol due to post partum hypertension. Andrea [name] does not monitor her blood pressure at home and will need to be evaluated in the office for HTN. Will likely switch her to amlodipine or HCTZ at that time.  Andrea [name] is uninsured. Denied for  medicaid and states Andrea [name] would not allow social services to make her husband pay child support so Andrea [name] was denied. Will need to apply for financial assistance here at the clinic.    DM TYPE 2 Needs repeat A1c. Andrea [name] does not have a meter. Will need to send glucometer and supplies to pharmacy. Andrea [name] does deny any hypo or hyperglycemic symptoms. Current medication: Lantus 55 units nightly.  Lab Results  Component Value Date   HGBA1C 6.0 (H) 11/24/2017    Essential Hypertension Andrea [name] was started on labetalol 200 mg BID after Andrea [name] gave birth to her son. Andrea [name] is no longer taking labetalol at this time and does not monitor her blood pressure. Will need office visit for BP check. Denies chest pain, shortness of breath, palpitations, lightheadedness, dizziness, headaches or BLE edema.  BP Readings from Last 3 Encounters:  02/22/18 (!) 142/94  02/18/18 (!) 157/85  02/09/18 134/73    Left Knee Pain Ongoing for a few months. No inciting injury or trauma. Pain likely related to weight as BMI 46. Andrea [name] does not take any medication to relieve her knee pain. Aggravating factors: climbing stairs, flexion of knee. Relieving factors: rest  Past Medical History:  Diagnosis Date  . Anemia    as a teenager  . Chicken pox   . Diabetes mellitus    type 2 on insulin  . Impacted teeth with abnormal position    impacted wisdom teeth    Past Surgical History:  Procedure Laterality Date  . CESAREAN SECTION N/A 09/16/2015   Procedure: CESAREAN SECTION;  Surgeon: Fredderick Phenix  Gala Romney, MD;  Location: [place];  Service: Obstetrics;  Laterality: N/A;  . CESAREAN SECTION N/A 02/06/2018   Procedure: CESAREAN SECTION;  Surgeon: Florian Buff, MD;  Location: [place];  Service: Obstetrics;  Laterality: N/A;  . FOREIGN BODY REMOVAL Left 08/28/2015   Procedure: REMOVAL FOREIGN BODY foot;  Surgeon: Vickey Huger, MD;  Location: [place];  Service: Orthopedics;  Laterality: Left;  regional block  . NO PAST SURGERIES     . TOOTH EXTRACTION N/A 07/24/2016   Procedure: EXTRACTION WISDOM TEETH ONE, SIXTEEN, SEVENTEEN AND THIRTY-TWO;  Surgeon: Diona Browner, DDS;  Location: [place];  Service: Oral Surgery;  Laterality: N/A;    Family History  Problem Relation Age of Onset  . Stroke Father   . Hypertension Father   . Diabetes Mellitus II Father   . Irregular heart beat Father   . Heart disease Father   . Hyperlipidemia Father   . Diabetes Mellitus II Mother   . Hyperlipidemia Mother   . Thyroid disease Mother   . Hypertension Maternal Aunt   . Thyroid disease Maternal Aunt   . Diabetes Mellitus II Maternal Grandmother   . Anesthesia problems Neg Hx     Social History   Socioeconomic History  . Marital status: Married    Spouse name: Not on file  . Number of children: Not on file  . Years of education: Not on file  . Highest education level: Not on file  Occupational History  . Not on file  Social Needs  . Financial resource strain: Not hard at all  . Food insecurity    Worry: Never true    Inability: Never true  . Transportation needs    Medical: No    Non-medical: Not on file  Tobacco Use  . Smoking status: Current Every Day Smoker    Types: Cigars  . Smokeless tobacco: Never Used  Substance and Sexual Activity  . Alcohol use: Yes    Comment: rare  . Drug use: No  . Sexual activity: Yes    Birth control/protection: None  Lifestyle  . Physical activity    Days per week: 7 days    Minutes per session: 150+ min  . Stress: Not at all  Relationships  . Social connections    Talks on phone: More than three times a week    Gets together: More than three times a week    Attends religious service: More than 4 times per year    Active member of club or organization: No    Attends meetings of clubs or organizations: Never    Relationship status: Married  Other Topics Concern  . Not on file  Social History Narrative  . Not on file     Observations/Objective: Awake, alert and oriented  x 3   Review of Systems  Constitutional: Negative for fever, malaise/fatigue and weight loss.  HENT: Negative.  Negative for nosebleeds.   Eyes: Negative.  Negative for blurred vision, double vision and photophobia.  Respiratory: Negative.  Negative for cough and shortness of breath.   Cardiovascular: Negative.  Negative for chest pain, palpitations and leg swelling.  Gastrointestinal: Negative.  Negative for heartburn, nausea and vomiting.  Musculoskeletal: Positive for joint pain. Negative for myalgias.  Neurological: Negative.  Negative for dizziness, focal weakness, seizures and headaches.  Psychiatric/Behavioral: Negative.  Negative for suicidal ideas.    Assessment and Plan: Andrea [name] was seen today for new patient (initial visit).  Diagnoses and all orders for  this visit:  Diabetes mellitus type 2, uncontrolled, without complications (HCC) -     Insulin Glargine (LANTUS SOLOSTAR) 100 UNIT/ML Solostar Pen; Inject 55 Units into the skin daily at 10 pm. -     Blood Glucose Monitoring Suppl (TRUE METRIX METER) w/Device KIT; Use as instructed. Check blood glucose level by fingerstick 2 times  per day. -     glucose blood (TRUE METRIX BLOOD GLUCOSE TEST) test strip; Use as instructed. Check blood glucose level by fingerstick twice per day. -     TRUEplus Lancets 28G MISC; Use as instructed. Check blood glucose level by fingerstick twice per day. -     Hemoglobin A1c; Future -     Lipid panel; Future Continue medications as prescribed.  Continue blood sugar control as discussed in office today, low carbohydrate diet, and regular physical exercise as tolerated, 150 minutes per week (30 min each day, 5 days per week, or 50 min 3 days per week). Keep blood sugar logs with fasting goal of 90-130 mg/dl, post prandial (after you eat) less than 180.  For Hypoglycemia: BS <60 and Hyperglycemia BS >400; contact the clinic ASAP. Annual eye exams and foot exams are recommended.  Chronic pain of left  knee -     ibuprofen (ADVIL) 800 MG tablet; Take 1 tablet (800 mg total) by mouth every 8 (eight) hours as needed. -     CBC; FutureOlivia was seen today for new patient (initial visit). Work on losing weight to help reduce joint pain. May alternate with heat and ice application for pain relief. May also alternate with acetaminophen  as prescribed pain relief. Other alternatives include massage, acupuncture and water aerobics.  You must stay active and avoid a sedentary lifestyle.  Morbid obesity with BMI of 45.0-49.9, adult ([place]) -     Lipid panel; Future  Essential hypertension -     CMP14+EGFR; Future  Remember to bring in your blood pressure log with you for your follow up appointment.  DASH/Mediterranean Diets are healthier choices for HTN.        Follow Up Instructions Return in about 4 weeks (around 12/12/2018).     I discussed the assessment and treatment plan with the patient. The patient was provided an opportunity to ask questions and all were answered. The patient agreed with the plan and demonstrated an understanding of the instructions.   The patient was advised to call back or seek an in-person evaluation if the symptoms worsen or if the condition fails to improve as anticipated.  I provided 21 minutes of non-face-to-face time during this encounter including median intraservice time, reviewing previous notes, labs, imaging, medications and explaining diagnosis and management.  Gildardo Pounds, FNP-BC

## 2018-11-22 ENCOUNTER — Other Ambulatory Visit: Payer: Self-pay

## 2018-11-28 ENCOUNTER — Ambulatory Visit: Payer: Self-pay | Attending: Family Medicine

## 2018-11-28 ENCOUNTER — Other Ambulatory Visit: Payer: Self-pay

## 2018-11-28 DIAGNOSIS — M25562 Pain in left knee: Secondary | ICD-10-CM

## 2018-11-28 DIAGNOSIS — G8929 Other chronic pain: Secondary | ICD-10-CM

## 2018-11-28 DIAGNOSIS — Z6841 Body Mass Index (BMI) 40.0 and over, adult: Secondary | ICD-10-CM

## 2018-11-28 DIAGNOSIS — I10 Essential (primary) hypertension: Secondary | ICD-10-CM

## 2018-11-28 DIAGNOSIS — IMO0001 Reserved for inherently not codable concepts without codable children: Secondary | ICD-10-CM

## 2018-11-29 LAB — CMP14+EGFR
ALT: 8 IU/L (ref 0–32)
AST: 12 IU/L (ref 0–40)
Albumin/Globulin Ratio: 1.3 (ref 1.2–2.2)
Albumin: 3.8 g/dL — ABNORMAL LOW (ref 3.9–5.0)
Alkaline Phosphatase: 84 IU/L (ref 39–117)
BUN/Creatinine Ratio: 9 (ref 9–23)
BUN: 6 mg/dL (ref 6–20)
Bilirubin Total: 0.4 mg/dL (ref 0.0–1.2)
CO2: 22 mmol/L (ref 20–29)
Calcium: 9.1 mg/dL (ref 8.7–10.2)
Chloride: 101 mmol/L (ref 96–106)
Creatinine, Ser: 0.7 mg/dL (ref 0.57–1.00)
GFR calc Af Amer: 136 mL/min/{1.73_m2} (ref >59)
GFR calc non Af Amer: 118 mL/min/{1.73_m2} (ref >59)
Globulin, Total: 2.9 g/dL (ref 1.5–4.5)
Glucose: 127 mg/dL — ABNORMAL HIGH (ref 65–99)
Potassium: 4.2 mmol/L (ref 3.5–5.2)
Sodium: 139 mmol/L (ref 134–144)
Total Protein: 6.7 g/dL (ref 6.0–8.5)

## 2018-11-29 LAB — CBC
Hematocrit: 36.9 % (ref 34.0–46.6)
Hemoglobin: 12.1 g/dL (ref 11.1–15.9)
MCH: 25.6 pg — ABNORMAL LOW (ref 26.6–33.0)
MCHC: 32.8 g/dL (ref 31.5–35.7)
MCV: 78 fL — ABNORMAL LOW (ref 79–97)
Platelets: 355 10*3/uL (ref 150–450)
RBC: 4.73 x10E6/uL (ref 3.77–5.28)
RDW: 14.6 % (ref 11.7–15.4)
WBC: 6.2 10*3/uL (ref 3.4–10.8)

## 2018-11-29 LAB — HEMOGLOBIN A1C
Est. average glucose Bld gHb Est-mCnc: 269 mg/dL
Hgb A1c MFr Bld: 11 % — ABNORMAL HIGH (ref 4.8–5.6)

## 2018-11-29 LAB — LIPID PANEL
Chol/HDL Ratio: 3.7 ratio (ref 0.0–4.4)
Cholesterol, Total: 139 mg/dL (ref 100–199)
HDL: 38 mg/dL — ABNORMAL LOW (ref >39)
LDL Calculated: 90 mg/dL (ref 0–99)
Triglycerides: 53 mg/dL (ref 0–149)
VLDL Cholesterol Cal: 11 mg/dL (ref 5–40)

## 2018-12-06 ENCOUNTER — Ambulatory Visit: Payer: Self-pay | Admitting: Nurse Practitioner

## 2018-12-07 ENCOUNTER — Ambulatory Visit: Payer: Self-pay

## 2018-12-10 ENCOUNTER — Emergency Department (HOSPITAL_COMMUNITY): Payer: Self-pay

## 2018-12-10 ENCOUNTER — Emergency Department (HOSPITAL_COMMUNITY)
Admission: EM | Admit: 2018-12-10 | Discharge: 2018-12-10 | Disposition: A | Discharge status: Home / Self Care | Payer: Self-pay | Attending: Emergency Medicine | Admitting: Emergency Medicine

## 2018-12-10 ENCOUNTER — Other Ambulatory Visit: Payer: Self-pay

## 2018-12-10 ENCOUNTER — Encounter (HOSPITAL_COMMUNITY): Payer: Self-pay | Admitting: Emergency Medicine

## 2018-12-10 DIAGNOSIS — Z794 Long term (current) use of insulin: Secondary | ICD-10-CM | POA: Insufficient documentation

## 2018-12-10 DIAGNOSIS — M25562 Pain in left knee: Secondary | ICD-10-CM | POA: Insufficient documentation

## 2018-12-10 DIAGNOSIS — F17290 Nicotine dependence, other tobacco product, uncomplicated: Secondary | ICD-10-CM | POA: Insufficient documentation

## 2018-12-10 DIAGNOSIS — E119 Type 2 diabetes mellitus without complications: Secondary | ICD-10-CM | POA: Insufficient documentation

## 2018-12-10 MED ORDER — NAPROXEN 500 MG PO TABS
500.0000 mg | ORAL_TABLET | Freq: Once | ORAL | Status: AC
  Administered 2018-12-10: 500 mg via ORAL
  Filled 2018-12-10: qty 1

## 2018-12-10 MED ORDER — NAPROXEN 500 MG PO TABS: 500.0000 mg | ORAL_TABLET | Freq: Two times a day (BID) | ORAL | 0 refills | Status: AC

## 2018-12-10 NOTE — ED Triage Notes (Signed)
Pt c/o left knee swelling for a couple weeks esp on the lateral side of knee. Pt denies falls, injuries or trauma.

## 2018-12-10 NOTE — ED Provider Notes (Signed)
[facility] DEPT Provider Note   CSN: [id number] Arrival date & time: 12/10/18  1556     History   Chief Complaint Chief Complaint  Patient presents with  . Joint Swelling    left knee    HPI Andrea [name] is a 28 y.o. female.     28 y.o female with a PMH of Anemia, DM presents to the ED with a chief complaint of left knee pain x 2 weeks. Patient reports she has been increasing her activity the last few weeks by walking several miles a day. She has noticed swelling along with pain on her left knee worst with ambulation. She has not tried nay medical therapy for relieve in symptoms. She denies any trauma, prior history of arthritis or fevers.   The history is provided by the patient.    Past Medical History:  Diagnosis Date  . Anemia    as a teenager  . Chicken pox   . Diabetes mellitus    type 2 on insulin  . Impacted teeth with abnormal position    impacted wisdom teeth    Patient Active Problem List   Diagnosis Date Noted  . Pregestational diabetes mellitus, modified White class B 02/05/2018  . Group B Streptococcus carrier, +RV culture, currently pregnant 02/05/2018  . Obesity in pregnancy 08/23/2017  . History of preterm delivery, currently pregnant 07/26/2017  . Supervision of other high risk pregnancy, antepartum 07/26/2017  . Healthcare maintenance 06/01/2016  . BMI 40.0-44.9, adult ([place]) 02/25/2016  . Previous cesarean delivery affecting pregnancy 09/17/2015  . Preexisting diabetes complicating pregnancy, antepartum 04/15/2015  . Diabetes mellitus type 2, uncontrolled, without complications ([place]) 85/88/5027    Past Surgical History:  Procedure Laterality Date  . CESAREAN SECTION N/A 09/16/2015   Procedure: CESAREAN SECTION;  Surgeon: Guss Bunde, MD;  Location: [facility];  Service: Obstetrics;  Laterality: N/A;  . CESAREAN SECTION N/A 02/06/2018   Procedure: CESAREAN SECTION;  Surgeon: Florian Buff, MD;   Location: [facility];  Service: Obstetrics;  Laterality: N/A;  . FOREIGN BODY REMOVAL Left 08/28/2015   Procedure: REMOVAL FOREIGN BODY foot;  Surgeon: Vickey Huger, MD;  Location: [facility];  Service: Orthopedics;  Laterality: Left;  regional block  . NO PAST SURGERIES    . TOOTH EXTRACTION N/A 07/24/2016   Procedure: EXTRACTION WISDOM TEETH ONE, SIXTEEN, SEVENTEEN AND THIRTY-TWO;  Surgeon: Diona Browner, DDS;  Location: [facility];  Service: Oral Surgery;  Laterality: N/A;     OB History    Gravida  2   Para  2   Term  1   Preterm  1   AB  0   Living  2     SAB  0   TAB  0   Ectopic  0   Multiple  0   Live Births  2            Home Medications    Prior to Admission medications   Medication Sig Start Date End Date Taking? Authorizing Provider  Insulin Glargine (LANTUS SOLOSTAR) 100 UNIT/ML Solostar Pen Inject 55 Units into the skin daily at 10 pm. 11/14/18  Yes Gildardo Pounds, NP  Blood Glucose Monitoring Suppl (TRUE METRIX METER) w/Device KIT Use as instructed. Check blood glucose level by fingerstick 2 times  per day. 11/14/18   Gildardo Pounds, NP  glucose blood (TRUE METRIX BLOOD GLUCOSE TEST) test strip Use as instructed. Check blood glucose level by fingerstick twice  per day. 11/14/18   Gildardo Pounds, NP  ibuprofen (ADVIL) 800 MG tablet Take 1 tablet (800 mg total) by mouth every 8 (eight) hours as needed. Patient not taking: Reported on 12/10/2018 11/14/18   Gildardo Pounds, NP  naproxen (NAPROSYN) 500 MG tablet Take 1 tablet (500 mg total) by mouth 2 (two) times daily for 7 days. 12/10/18 12/17/18  Janeece Fitting, PA-C  TRUEplus Lancets 28G MISC Use as instructed. Check blood glucose level by fingerstick twice per day. 11/14/18   Gildardo Pounds, NP    Family History Family History  Problem Relation Age of Onset  . Stroke Father   . Hypertension Father   . Diabetes Mellitus II Father   . Irregular heart beat Father   . Heart disease Father   . Hyperlipidemia  Father   . Diabetes Mellitus II Mother   . Hyperlipidemia Mother   . Thyroid disease Mother   . Hypertension Maternal Aunt   . Thyroid disease Maternal Aunt   . Diabetes Mellitus II Maternal Grandmother   . Anesthesia problems Neg Hx     Social History Social History   Tobacco Use  . Smoking status: Current Every Day Smoker    Types: Cigars  . Smokeless tobacco: Never Used  Substance Use Topics  . Alcohol use: Yes    Comment: rare  . Drug use: No     Allergies   Metformin and related   Review of Systems Review of Systems  Constitutional: Negative for fever.  Respiratory: Negative for shortness of breath.   Cardiovascular: Negative for chest pain.  Gastrointestinal: Negative for abdominal pain.  Genitourinary: Negative for flank pain.  Musculoskeletal: Positive for arthralgias.  Neurological: Negative for headaches.  All other systems reviewed and are negative.    Physical Exam Updated Vital Signs BP 123/70 (BP Location: Left Arm)   Pulse 80   Temp 98.5 F (36.9 C) (Oral)   Resp 18   LMP 12/01/2018   SpO2 99%   Physical Exam Vitals signs and nursing note reviewed.  Constitutional:      General: She is not in acute distress.    Appearance: She is well-developed.  HENT:     Head: Normocephalic and atraumatic.     Mouth/Throat:     Pharynx: No oropharyngeal exudate.  Eyes:     Pupils: Pupils are equal, round, and reactive to light.  Neck:     Musculoskeletal: Normal range of motion.  Cardiovascular:     Rate and Rhythm: Regular rhythm.     Heart sounds: Normal heart sounds.  Pulmonary:     Effort: Pulmonary effort is normal. No respiratory distress.     Breath sounds: Normal breath sounds.  Abdominal:     General: Bowel sounds are normal.     Palpations: Abdomen is soft.  Musculoskeletal:        General: No tenderness or deformity.     Left knee: She exhibits swelling, effusion and erythema. She exhibits normal range of motion, no deformity, no  laceration and no LCL laxity.     Right lower leg: No edema.     Left lower leg: No edema.       Legs:     Comments: Pulses present, full ROM with pain. RLE- KF,KE 5/5 strength LLE- HF, HE 5/5 strength Normal gait. No pronator drift. No leg drop.  No calf tenderness.       Skin:    General: Skin is warm and dry.  Neurological:  Mental Status: She is alert and oriented to person, place, and time.      ED Treatments / Results  Labs (all labs ordered are listed, but only abnormal results are displayed) Labs Reviewed - No data to display  EKG None  Radiology Dg Knee 2 Views Left  Result Date: 12/10/2018 CLINICAL DATA:  Acute LEFT knee pain and swelling for 2 weeks. Initial encounter. EXAM: LEFT KNEE - 1-2 VIEW COMPARISON:  None. FINDINGS: No fracture, subluxation or dislocation. A small knee effusion is present. The joint spaces otherwise unremarkable. No focal bony lesions are present. IMPRESSION: Knee effusion without other significant abnormality. Electronically Signed   By: Margarette Canada M.D.   On: 12/10/2018 17:54    Procedures Procedures (including critical care time)  Medications Ordered in ED Medications  naproxen (NAPROSYN) tablet 500 mg (500 mg Oral Given 12/10/18 1715)     Initial Impression / Assessment and Plan / ED Course  I have reviewed the triage vital signs and the nursing notes.  Pertinent labs & imaging results that were available during my care of the patient were reviewed by me and considered in my medical decision making (see chart for details).       Patient with a past medical history of diabetes presents to the ED with complaints of left knee pain, she has had an increase in activity, states that she has been walking several miles a day in order to lose weight.  She reports pain along the lower patellar region, states there is swelling to the area along with erythematous and warmth.  She denies any fevers, IV drug use, trauma.  An x-ray of  her left knee was obtained which showed a small effusion, patient was placed on a knee sleeve, advised to elevate, apply heat or ice to the area along with a short course of anti-inflammatories.  A referral for orthopedist Dr. Ninfa Linden has also been provided on her chart.  Patient understands and agrees with management at this time.  Return precautions provided at length.  Portions of this note were generated with [employer]. Dictation errors may occur despite best attempts at proofreading.  Final Clinical Impressions(s) / ED Diagnoses   Final diagnoses:  Acute pain of left knee    ED Discharge Orders         Ordered    naproxen (NAPROSYN) 500 MG tablet  2 times daily     12/10/18 1836           Janeece Fitting, PA-C 12/10/18 1839    Daleen Bo, MD 12/11/18 [phone]

## 2018-12-10 NOTE — Discharge Instructions (Addendum)
Your xray today was normal.  I have prescribed anti-inflammatories to help with your pain, please take 1 tablet twice a day for the next 7 days.  You may also apply ice, heat to the area along with keep your knee elevated while at rest.  The number to Dr. Ninfa Linden, orthopedist is attached to your chart, you may schedule an appointment for further follow-up if needed.

## 2019-02-15 ENCOUNTER — Other Ambulatory Visit: Payer: Self-pay

## 2019-02-15 ENCOUNTER — Ambulatory Visit: Payer: Self-pay | Attending: Family Medicine | Admitting: Family Medicine

## 2019-02-15 DIAGNOSIS — M722 Plantar fascial fibromatosis: Secondary | ICD-10-CM

## 2019-02-15 DIAGNOSIS — Z794 Long term (current) use of insulin: Secondary | ICD-10-CM

## 2019-02-15 DIAGNOSIS — E1165 Type 2 diabetes mellitus with hyperglycemia: Secondary | ICD-10-CM

## 2019-02-15 MED ORDER — PREDNISONE 20 MG PO TABS: 20.0000 mg | ORAL_TABLET | Freq: Every day | ORAL | 0 refills | Status: DC

## 2019-02-15 MED ORDER — TRUE METRIX BLOOD GLUCOSE TEST VI STRP: ORAL_STRIP | 12 refills | Status: AC

## 2019-02-15 MED ORDER — TRUE METRIX METER W/DEVICE KIT: PACK | 0 refills | Status: AC

## 2019-02-15 MED ORDER — TRUEPLUS LANCETS 28G MISC: 3 refills | Status: AC

## 2019-02-15 MED FILL — TRUE METRIX TEST STRIP: 50 days supply | Qty: 100 | Fill #0

## 2019-02-15 MED FILL — TRUEplus LANCETS 28G MISC: 50 days supply | Qty: 100 | Fill #0

## 2019-02-15 MED FILL — !TRUE METRIX BLOOD GLUCOSE: 1 days supply | Qty: 1 | Fill #0

## 2019-02-15 MED FILL — predniSONE 20 MG TABS: 20 | 5 days supply | Qty: 5 | Fill #0

## 2019-02-15 NOTE — Progress Notes (Signed)
Virtual Visit via Telephone Note  I connected with JRUE YAMBAO, on 02/15/2019 at 2:21 PM by telephone due to the COVID-19 pandemic and verified that I am speaking with the correct person using two identifiers.   Consent: I discussed the limitations, risks, security and privacy concerns of performing an evaluation and management service by telephone and the availability of in person appointments. I also discussed with the patient that there may be a patient responsible charge related to this service. The patient expressed understanding and agreed to proceed.   Location of Patient: Home  Location of Provider: Clinic   Persons participating in Telemedicine visit: Christiana J Lizabeth Leyden Farrington-CMA Dr. Felecia Shelling     History of Present 29: 28 year old female with a history of obesity, type 2 diabetes mellitus (A1c 11.0) who presents today for an acute visit.  She complains of left foot pain in her heel which is excruciating and preventing her from standing. Pain is sharp and shooting. Currently does not use any analgesics and symptoms are present through out the day and worse when she gets up from a sitting position; it is rated as 9/10.  With regards to diabetes mellitus she has been on her current regimen of Lantus which she received from her OB/GYN but has not been checking her sugars as she has no glucometer.  On one occasion she did feel lightheaded but denies any additional hypoglycemic symptoms.  She has not been to see her PCP since she had her baby 1 year ago.  Past Medical History:  Diagnosis Date  . Anemia    as a teenager  . Chicken pox   . Diabetes mellitus    type 2 on insulin  . Impacted teeth with abnormal position    impacted wisdom teeth   Allergies  Allergen Reactions  . Metformin And Related Diarrhea    Current Outpatient Medications on File Prior to Visit  Medication Sig Dispense Refill  . Blood Glucose Monitoring Suppl (TRUE METRIX METER)  w/Device KIT Use as instructed. Check blood glucose level by fingerstick 2 times  per day. 1 kit 0  . glucose blood (TRUE METRIX BLOOD GLUCOSE TEST) test strip Use as instructed. Check blood glucose level by fingerstick twice per day. 100 each 12  . Insulin Glargine (LANTUS SOLOSTAR) 100 UNIT/ML Solostar Pen Inject 55 Units into the skin daily at 10 pm. 5 pen 10  . TRUEplus Lancets 28G MISC Use as instructed. Check blood glucose level by fingerstick twice per day. 100 each 3  . ibuprofen (ADVIL) 800 MG tablet Take 1 tablet (800 mg total) by mouth every 8 (eight) hours as needed. (Patient not taking: Reported on 12/10/2018) 60 tablet 1   No current facility-administered medications on file prior to visit.     Observations/Objective: Awake, alert, oriented x3 Alert no acute distress  Lab Results  Component Value Date   HGBA1C 11.0 (H) 11/28/2018    Assessment and Plan: 1. Type 2 diabetes mellitus with hyperglycemia, with long-term current use of insulin (HCC) Uncontrolled with A1c of 11.0 Advised to pick up glucometer from pharmacy and keep log of blood sugars At next office visit will discuss initiation of Victoza which will be beneficial with regards to weight loss Continue diabetic diet, lifestyle modifications Counseled on Diabetic diet, my plate method, 956 minutes of moderate intensity exercise/week Blood sugar logs with fasting goals of 80-120 mg/dl, random of less than 180 and in the event of sugars less than 60 mg/dl or greater than  400 mg/dl encouraged to notify the clinic. Advised on the need for annual eye exams, annual foot exams, Pneumonia vaccine. - Blood Glucose Monitoring Suppl (TRUE METRIX METER) w/Device KIT; Use as instructed. Check blood glucose level by fingerstick 2 times  per day.  Dispense: 1 kit; Refill: 0 - glucose blood (TRUE METRIX BLOOD GLUCOSE TEST) test strip; Use as instructed. Check blood glucose level by fingerstick twice per day.  Dispense: 100 each;  Refill: 12 - TRUEplus Lancets 28G MISC; Use as instructed. Check blood glucose level by fingerstick twice per day.  Dispense: 100 each; Refill: 3  2. Plantar fasciitis Advised to obtain insoles Once course of prednisone is completed she will commence OTC NSAIDs If symptoms persist consider referral for cortisone injections - predniSONE (DELTASONE) 20 MG tablet; Take 1 tablet (20 mg total) by mouth daily with breakfast.  Dispense: 5 tablet; Refill: 0     Follow Up Instructions: Return in about 1 month (around 03/17/2019) for Medical conditions.   I discussed the assessment and treatment plan with the patient. The patient was provided an opportunity to ask questions and all were answered. The patient agreed with the plan and demonstrated an understanding of the instructions.   The patient was advised to call back or seek an in-person evaluation if the symptoms worsen or if the condition fails to improve as anticipated.     I provided 15 minutes total of non-face-to-face time during this encounter including median intraservice time, reviewing previous notes, labs, imaging, medications, management and patient verbalized understanding.     Charlott Rakes, MD, FAAFP. [facility] and [place], [place]   02/15/2019, 2:21 PM

## 2019-02-15 NOTE — Progress Notes (Signed)
Patient has been called and DOB has been verified. Patient has been screened and transferred to PCP to start phone visit.  Patient is having pain in foot.

## 2019-02-16 ENCOUNTER — Encounter: Payer: Self-pay | Admitting: Family Medicine

## 2019-03-27 ENCOUNTER — Ambulatory Visit: Payer: Self-pay | Admitting: Family Medicine

## 2019-05-16 MED FILL — TRUE METRIX TEST STRIP: 50 days supply | Qty: 100 | Fill #1

## 2019-05-16 MED FILL — !LANTUS SOLOSTAR 100UNITS/M: 100 | 16 days supply | Qty: 9 | Fill #0

## 2019-05-17 ENCOUNTER — Ambulatory Visit: Payer: Self-pay | Admitting: Physician Assistant

## 2019-05-22 ENCOUNTER — Ambulatory Visit: Payer: Self-pay | Admitting: Physician Assistant

## 2019-06-26 ENCOUNTER — Other Ambulatory Visit: Payer: Self-pay | Admitting: Pharmacist

## 2019-06-26 DIAGNOSIS — Z794 Long term (current) use of insulin: Secondary | ICD-10-CM

## 2019-06-26 DIAGNOSIS — E1165 Type 2 diabetes mellitus with hyperglycemia: Secondary | ICD-10-CM

## 2019-06-26 MED ORDER — INSULIN GLARGINE 100 UNITS/ML SOLOSTAR PEN: 55.0000 [IU] | PEN_INJECTOR | Freq: Every day | SUBCUTANEOUS | 0 refills | Status: DC

## 2019-06-26 MED ORDER — LANTUS SOLOSTAR 100 UNIT/ML ~~LOC~~ SOPN: 55.0000 [IU] | PEN_INJECTOR | Freq: Every day | SUBCUTANEOUS | 0 refills | Status: DC

## 2019-06-27 MED FILL — !LANTUS SOLOSTAR 100UNITS/M: 100 | 27 days supply | Qty: 15 | Fill #0

## 2019-07-14 ENCOUNTER — Ambulatory Visit: Payer: Self-pay | Attending: Internal Medicine

## 2019-07-14 DIAGNOSIS — Z23 Encounter for immunization: Secondary | ICD-10-CM

## 2019-07-14 NOTE — Progress Notes (Signed)
   Covid-19 Vaccination Clinic  Name:  Andrea [name]    MRN: [id number] DOB: 1990-12-16  07/14/2019  [name] was observed post Covid-19 immunization for 15 minutes without incident. She was provided with Vaccine Information Sheet and instruction to access the V-Safe system.   [name] was instructed to call 911 with any severe reactions post vaccine: Marland Kitchen Difficulty breathing  . Swelling of face and throat  . A fast heartbeat  . A bad rash all over body  . Dizziness and weakness   Immunizations Administered    Name Date Dose VIS Date Route   Pfizer COVID-19 Vaccine 07/14/2019  9:50 AM 0.3 mL 03/24/2019 Intramuscular   Manufacturer: [employer], [employer]   Lot: [id number]   NDC: [address]-9

## 2019-07-27 ENCOUNTER — Encounter: Payer: Self-pay | Admitting: *Deleted

## 2019-08-01 MED FILL — !LANTUS SOLOSTAR 100UNITS/M: 100 | 16 days supply | Qty: 9 | Fill #0

## 2019-08-07 ENCOUNTER — Ambulatory Visit: Payer: Self-pay | Attending: Internal Medicine

## 2019-08-07 DIAGNOSIS — Z23 Encounter for immunization: Secondary | ICD-10-CM

## 2019-08-07 NOTE — Progress Notes (Signed)
   Covid-19 Vaccination Clinic  Name:  Andrea [name]    MRN: [id number] DOB: 03/22/1991  08/07/2019  Ms. Andrea [name] was observed post Covid-19 immunization for 15 minutes without incident. She was provided with Vaccine Information Sheet and instruction to access the V-Safe system.   Ms. Andrea [name] was instructed to call 911 with any severe reactions post vaccine: Marland Kitchen Difficulty breathing  . Swelling of face and throat  . A fast heartbeat  . A bad rash all over body  . Dizziness and weakness   Immunizations Administered    Name Date Dose VIS Date Route   Pfizer COVID-19 Vaccine 08/07/2019 12:13 PM 0.3 mL 06/07/2018 Intramuscular   Manufacturer: [employer], [employer]   Lot: [id number]   NDC: [address]-1

## 2019-09-25 ENCOUNTER — Other Ambulatory Visit: Payer: Self-pay

## 2019-09-25 ENCOUNTER — Encounter: Payer: Self-pay | Admitting: Family Medicine

## 2019-09-25 ENCOUNTER — Ambulatory Visit: Payer: Self-pay | Attending: Family Medicine | Admitting: Family Medicine

## 2019-09-25 VITALS — BP 161/90 | HR 88 | Ht 62.0 in | Wt 258.0 lb

## 2019-09-25 DIAGNOSIS — E1165 Type 2 diabetes mellitus with hyperglycemia: Secondary | ICD-10-CM

## 2019-09-25 DIAGNOSIS — R030 Elevated blood-pressure reading, without diagnosis of hypertension: Secondary | ICD-10-CM

## 2019-09-25 DIAGNOSIS — Z794 Long term (current) use of insulin: Secondary | ICD-10-CM

## 2019-09-25 DIAGNOSIS — M722 Plantar fascial fibromatosis: Secondary | ICD-10-CM

## 2019-09-25 DIAGNOSIS — Z6841 Body Mass Index (BMI) 40.0 and over, adult: Secondary | ICD-10-CM

## 2019-09-25 LAB — POCT GLYCOSYLATED HEMOGLOBIN (HGB A1C): HbA1c, POC (controlled diabetic range): 7.7 % — AB (ref 0.0–7.0)

## 2019-09-25 LAB — GLUCOSE, POCT (MANUAL RESULT ENTRY): POC Glucose: 174 mg/dl — AB (ref 70–99)

## 2019-09-25 MED ORDER — INSULIN PEN NEEDLE 31G X 5 MM MISC: 1.0000 | Freq: Two times a day (BID) | 1 refills | Status: AC

## 2019-09-25 MED ORDER — LANTUS SOLOSTAR 100 UNIT/ML ~~LOC~~ SOPN: 55.0000 [IU] | PEN_INJECTOR | Freq: Every day | SUBCUTANEOUS | 3 refills | Status: DC

## 2019-09-25 MED ORDER — INSULIN PEN NEEDLE 31G X 5 MM MISC: 1.0000 | Freq: Two times a day (BID) | 1 refills | Status: DC

## 2019-09-25 MED ORDER — VICTOZA 18 MG/3ML ~~LOC~~ SOPN: PEN_INJECTOR | SUBCUTANEOUS | 3 refills | Status: DC

## 2019-09-25 NOTE — Progress Notes (Signed)
Subjective:  Patient ID: Andrea [name], female    DOB: March 20, 1991  Age: 29 y.o. MRN: [id number]  CC: Diabetes   HPI Andrea [name] is a 29 year old female with history of type 2 diabetes mellitus (A1c 7.7), morbid obesity who presents today for follow-up visit. Her blood pressure is elevated today and she denies a history of hypertension but endorses eating some hot dogs yesterday evening. With regards to her diabetes mellitus she has been compliant with her current dose of Lantus and denies visual concerns.  The major form of exercise she gets is running around with her toddlers but she does not participate in a structured exercise program. She is interested in getting on Victoza for management of her diabetes. She has plantar fasciitis and has pain in her heels especially with prolonged standing while doing house chores.  Treated with short course of prednisone sometime last year with some improvement in symptoms. Past Medical History:  Diagnosis Date  . Anemia    as a teenager  . Chicken pox   . Diabetes mellitus    type 2 on insulin  . Impacted teeth with abnormal position    impacted wisdom teeth    Past Surgical History:  Procedure Laterality Date  . CESAREAN SECTION N/A 09/16/2015   Procedure: CESAREAN SECTION;  Surgeon: Guss Bunde, MD;  Location: [facility];  Service: Obstetrics;  Laterality: N/A;  . CESAREAN SECTION N/A 02/06/2018   Procedure: CESAREAN SECTION;  Surgeon: Florian Buff, MD;  Location: [facility];  Service: Obstetrics;  Laterality: N/A;  . FOREIGN BODY REMOVAL Left 08/28/2015   Procedure: REMOVAL FOREIGN BODY foot;  Surgeon: Vickey Huger, MD;  Location: [facility];  Service: Orthopedics;  Laterality: Left;  regional block  . NO PAST SURGERIES    . TOOTH EXTRACTION N/A 07/24/2016   Procedure: EXTRACTION WISDOM TEETH ONE, SIXTEEN, SEVENTEEN AND THIRTY-TWO;  Surgeon: Diona Browner, DDS;  Location: [facility];  Service: Oral Surgery;  Laterality:  N/A;    Family History  Problem Relation Age of Onset  . Stroke Father   . Hypertension Father   . Diabetes Mellitus II Father   . Irregular heart beat Father   . Heart disease Father   . Hyperlipidemia Father   . Diabetes Mellitus II Mother   . Hyperlipidemia Mother   . Thyroid disease Mother   . Hypertension Maternal Aunt   . Thyroid disease Maternal Aunt   . Diabetes Mellitus II Maternal Grandmother   . Anesthesia problems Neg Hx     Allergies  Allergen Reactions  . Metformin And Related Diarrhea    Outpatient Medications Prior to Visit  Medication Sig Dispense Refill  . Blood Glucose Monitoring Suppl (TRUE METRIX METER) w/Device KIT Use as instructed. Check blood glucose level by fingerstick 2 times  per day. 1 kit 0  . glucose blood (TRUE METRIX BLOOD GLUCOSE TEST) test strip Use as instructed. Check blood glucose level by fingerstick twice per day. 100 each 12  . insulin glargine (LANTUS SOLOSTAR) 100 UNIT/ML Solostar Pen Inject 55 Units into the skin daily. 15 mL 0  . TRUEplus Lancets 28G MISC Use as instructed. Check blood glucose level by fingerstick twice per day. 100 each 3  . ibuprofen (ADVIL) 800 MG tablet Take 1 tablet (800 mg total) by mouth every 8 (eight) hours as needed. (Patient not taking: Reported on 12/10/2018) 60 tablet 1  . predniSONE (DELTASONE) 20 MG tablet Take 1 tablet (20 mg total) by  mouth daily with breakfast. (Patient not taking: Reported on 09/25/2019) 5 tablet 0   No facility-administered medications prior to visit.     ROS Review of Systems  Constitutional: Negative for activity change, appetite change and fatigue.  HENT: Negative for congestion, sinus pressure and sore throat.   Eyes: Negative for visual disturbance.  Respiratory: Negative for cough, chest tightness, shortness of breath and wheezing.   Cardiovascular: Negative for chest pain and palpitations.  Gastrointestinal: Negative for abdominal distention, abdominal pain and  constipation.  Endocrine: Negative for polydipsia.  Genitourinary: Negative for dysuria and frequency.  Musculoskeletal: Negative for arthralgias and back pain.  Skin: Negative for rash.  Neurological: Negative for tremors, light-headedness and numbness.  Hematological: Does not bruise/bleed easily.  Psychiatric/Behavioral: Negative for agitation and behavioral problems.    Objective:  BP (!) 161/90   Pulse 88   Ht '5\' 2"'  (1.575 m)   Wt 258 lb (117 kg)   SpO2 99%   BMI 47.19 kg/m   BP/Weight 09/25/2019 2/00/3794 07/15/6188  Systolic BP 122 241 -  Diastolic BP 90 70 -  Wt. (Lbs) 258 - 246  BMI 47.19 - 46.51      Physical Exam Constitutional:      Appearance: She is well-developed. She is obese.  Neck:     Vascular: No JVD.  Cardiovascular:     Rate and Rhythm: Normal rate.     Heart sounds: Normal heart sounds. No murmur heard.   Pulmonary:     Effort: Pulmonary effort is normal.     Breath sounds: Normal breath sounds. No wheezing or rales.  Chest:     Chest wall: No tenderness.  Abdominal:     General: Bowel sounds are normal. There is no distension.     Palpations: Abdomen is soft. There is no mass.     Tenderness: There is no abdominal tenderness.  Musculoskeletal:        General: Normal range of motion.     Right lower leg: No edema.     Left lower leg: No edema.  Neurological:     Mental Status: She is alert and oriented to person, place, and time.  Psychiatric:        Mood and Affect: Mood normal.     CMP Latest Ref Rng & Units 11/28/2018 02/18/2018 02/07/2018  Glucose 65 - 99 mg/dL 127(H) 51(L) -  BUN 6 - 20 mg/dL 6 6 -  Creatinine 0.57 - 1.00 mg/dL 0.70 0.69 0.69  Sodium 134 - 144 mmol/L 139 139 -  Potassium 3.5 - 5.2 mmol/L 4.2 3.7 -  Chloride 96 - 106 mmol/L 101 106 -  CO2 20 - 29 mmol/L 22 24 -  Calcium 8.7 - 10.2 mg/dL 9.1 8.8(L) -  Total Protein 6.0 - 8.5 g/dL 6.7 7.5 -  Total Bilirubin 0.0 - 1.2 mg/dL 0.4 0.8 -  Alkaline Phos 39 - 117 IU/L  84 75 -  AST 0 - 40 IU/L 12 18 -  ALT 0 - 32 IU/L 8 16 -    Lipid Panel     Component Value Date/Time   CHOL 139 11/28/2018 1143   TRIG 53 11/28/2018 1143   HDL 38 (L) 11/28/2018 1143   CHOLHDL 3.7 11/28/2018 1143   LDLCALC 90 11/28/2018 1143    CBC    Component Value Date/Time   WBC 6.2 11/28/2018 1143   WBC 5.8 02/18/2018 1239   RBC 4.73 11/28/2018 1143   RBC 4.40 02/18/2018 1239  HGB 12.1 11/28/2018 1143   HCT 36.9 11/28/2018 1143   PLT 355 11/28/2018 1143   MCV 78 (L) 11/28/2018 1143   MCH 25.6 (L) 11/28/2018 1143   MCH 25.5 (L) 02/18/2018 1239   MCHC 32.8 11/28/2018 1143   MCHC 32.2 02/18/2018 1239   RDW 14.6 11/28/2018 1143   LYMPHSABS 2.4 07/26/2017 1540   MONOABS 0.6 09/07/2015 0756   EOSABS 0.2 07/26/2017 1540   BASOSABS 0.0 07/26/2017 1540    Lab Results  Component Value Date   HGBA1C 7.7 (A) 09/25/2019    Assessment & Plan:   1. Type 2 diabetes mellitus with hyperglycemia, with long-term current use of insulin (HCC) Not fully optimized with A1c of 7.7; goal is less than 7.0 Victoza added to her regimen which will be beneficial with regards to cardiovascular benefits and weight loss Clinical pharmacist called in to perform education on administration of Victoza Counseled on Diabetic diet, my plate method, 017 minutes of moderate intensity exercise/week Blood sugar logs with fasting goals of 80-120 mg/dl, random of less than 180 and in the event of sugars less than 60 mg/dl or greater than 400 mg/dl encouraged to notify the clinic. Advised on the need for annual eye exams, annual foot exams, Pneumonia vaccine. - POCT glucose (manual entry) - POCT glycosylated hemoglobin (Hb A1C) - CMP14+EGFR; Future - Lipid panel; Future - Microalbumin / creatinine urine ratio; Future - insulin glargine (LANTUS SOLOSTAR) 100 UNIT/ML Solostar Pen; Inject 55 Units into the skin daily.  Dispense: 30 mL; Refill: 3 - liraglutide (VICTOZA) 18 MG/3ML SOPN; Start 0.24m SQ  once a day for 7 days, then increase to 1.227monce a day, then increase to 1.24m14maily thereafter.  Dispense: 3 pen; Refill: 3 - Insulin Pen Needle 31G X 5 MM MISC; 1 each by Does not apply route in the morning and at bedtime. For Victoza and Lantus  Dispense: 180 each; Refill: 1  2. Elevated blood pressure reading Discussed lifestyle modifications We will reassess at next visit to determine if antihypertensive needs to be initiated  3. Plantar fasciitis Advised patient to use insoles Use anti-inflammatory OTC Consider podiatry referral if symptoms persist  4. Morbid obesity with BMI of 45.0-49.9, adult ([place]CC[place] on 150 minutes of moderate intensity exercise per week, restricting caloric intake  Return in about 1 month (around 10/25/2019) for follow up of elevated blood pressure.      EnoCharlott RakesD, FAAFP. [facility] [place]6/14/2021, 3:50 PM

## 2019-09-25 NOTE — Patient Instructions (Signed)
Plantar Fasciitis  Plantar fasciitis is a painful foot condition that affects the heel. It occurs when the band of tissue that connects the toes to the heel bone (plantar fascia) becomes irritated. This can happen as the result of exercising too much or doing other repetitive activities (overuse injury). The pain from plantar fasciitis can range from mild irritation to severe pain that makes it difficult to walk or move. The pain is usually worse in the morning after sleeping, or after sitting or lying down for a while. Pain may also be worse after long periods of walking or standing. What are the causes? This condition may be caused by:  Standing for long periods of time.  Wearing shoes that do not have good arch support.  Doing activities that put stress on joints (high-impact activities), including running, aerobics, and ballet.  Being overweight.  An abnormal way of walking (gait).  Tight muscles in the back of your lower leg (calf).  High arches in your feet.  Starting a new athletic activity. What are the signs or symptoms? The main symptom of this condition is heel pain. Pain may:  Be worse with first steps after a time of rest, especially in the morning after sleeping or after you have been sitting or lying down for a while.  Be worse after long periods of standing still.  Decrease after 30-45 minutes of activity, such as gentle walking. How is this diagnosed? This condition may be diagnosed based on your medical history and your symptoms. Your health care provider may ask questions about your activity level. Your health care provider will do a physical exam to check for:  A tender area on the bottom of your foot.  A high arch in your foot.  Pain when you move your foot.  Difficulty moving your foot. You may have imaging tests to confirm the diagnosis, such as:  X-rays.  Ultrasound.  MRI. How is this treated? Treatment for plantar fasciitis depends on how  severe your condition is. Treatment may include:  Rest, ice, applying pressure (compression), and raising the affected foot (elevation). This may be called RICE therapy. Your health care provider may recommend RICE therapy along with over-the-counter pain medicines to manage your pain.  Exercises to stretch your calves and your plantar fascia.  A splint that holds your foot in a stretched, upward position while you sleep (night splint).  Physical therapy to relieve symptoms and prevent problems in the future.  Injections of steroid medicine (cortisone) to relieve pain and inflammation.  Stimulating your plantar fascia with electrical impulses (extracorporeal shock wave therapy). This is usually the last treatment option before surgery.  Surgery, if other treatments have not worked after 12 months. Follow these instructions at home:  Managing pain, stiffness, and swelling  If directed, put ice on the painful area: ? Put ice in a plastic bag, or use a frozen bottle of water. ? Place a towel between your skin and the bag or bottle. ? Roll the bottom of your foot over the bag or bottle. ? Do this for 20 minutes, 2-3 times a day.  Wear athletic shoes that have air-sole or gel-sole cushions, or try wearing soft shoe inserts that are designed for plantar fasciitis.  Raise (elevate) your foot above the level of your heart while you are sitting or lying down. Activity  Avoid activities that cause pain. Ask your health care provider what activities are safe for you.  Do physical therapy exercises and stretches as told   by your health care provider.  Try activities and forms of exercise that are easier on your joints (low-impact). Examples include swimming, water aerobics, and biking. General instructions  Take over-the-counter and prescription medicines only as told by your health care provider.  Wear a night splint while sleeping, if told by your health care provider. Loosen the splint  if your toes tingle, become numb, or turn cold and blue.  Maintain a healthy weight, or work with your health care provider to lose weight as needed.  Keep all follow-up visits as told by your health care provider. This is important. Contact a health care provider if you:  Have symptoms that do not go away after caring for yourself at home.  Have pain that gets worse.  Have pain that affects your ability to move or do your daily activities. Summary  Plantar fasciitis is a painful foot condition that affects the heel. It occurs when the band of tissue that connects the toes to the heel bone (plantar fascia) becomes irritated.  The main symptom of this condition is heel pain that may be worse after exercising too much or standing still for a long time.  Treatment varies, but it usually starts with rest, ice, compression, and elevation (RICE therapy) and over-the-counter medicines to manage pain. This information is not intended to replace advice given to you by your health care provider. Make sure you discuss any questions you have with your health care provider. Document Revised: 03/12/2017 Document Reviewed: 01/25/2017 Elsevier Patient Education  2020 Elsevier Inc.  

## 2019-09-26 MED FILL — TRUEPLUS PEN NDL 31GX5/16: 31G X 8 MM | 30 days supply | Qty: 100 | Fill #0

## 2019-09-26 MED FILL — !LANTUS SOLOSTAR 100UNITS/M: 100 | 16 days supply | Qty: 9 | Fill #0

## 2019-09-26 MED FILL — !VICTOZA 18MG/3ML INJECT: 18 | 17 days supply | Qty: 3 | Fill #0

## 2019-10-02 ENCOUNTER — Ambulatory Visit: Payer: Self-pay

## 2019-10-05 ENCOUNTER — Telehealth: Payer: Self-pay | Admitting: Family Medicine

## 2019-10-05 ENCOUNTER — Ambulatory Visit: Payer: Self-pay | Attending: Family Medicine

## 2019-10-05 ENCOUNTER — Other Ambulatory Visit: Payer: Self-pay

## 2019-10-05 NOTE — Telephone Encounter (Signed)
Pt came to apply for CAFA, OC and Rx, Pt said that is marry and not living together, but still marry, by law we need to get the financial information from her husband as well as her, she got upset and left, saying that she is her for herself not for him an why we need his info. She was told that she still marry and is one income for the family, even if they file separate still need his financial information

## 2019-10-06 MED FILL — LANTUS SOLOSTAR 100 UNITS/M: 100 | 16 days supply | Qty: 9 | Fill #0

## 2019-10-09 ENCOUNTER — Ambulatory Visit: Payer: Self-pay

## 2019-10-12 MED FILL — TRUEPLUS PEN NDL 31GX5/16: 31G X 8 MM | 30 days supply | Qty: 100 | Fill #0

## 2019-10-25 ENCOUNTER — Other Ambulatory Visit: Payer: Self-pay | Admitting: Family Medicine

## 2019-10-25 ENCOUNTER — Ambulatory Visit: Payer: Self-pay | Admitting: Family Medicine

## 2019-10-25 DIAGNOSIS — Z794 Long term (current) use of insulin: Secondary | ICD-10-CM

## 2019-10-25 MED ORDER — VICTOZA 18 MG/3ML ~~LOC~~ SOPN: PEN_INJECTOR | SUBCUTANEOUS | 3 refills | Status: DC

## 2019-10-25 MED ORDER — LANTUS SOLOSTAR 100 UNIT/ML ~~LOC~~ SOPN: 55.0000 [IU] | PEN_INJECTOR | Freq: Every day | SUBCUTANEOUS | 3 refills | Status: DC

## 2019-10-25 MED FILL — LANTUS SOLOSTAR 100 UNITS/M: 100 | 27 days supply | Qty: 15 | Fill #0

## 2019-10-25 MED FILL — VICTOZA 18 MG/3 ML INJECT P: 18 | 9 days supply | Qty: 9 | Fill #0

## 2019-10-25 NOTE — Telephone Encounter (Signed)
Medication Refill - Medication: liraglutide (VICTOZA) 18 MG/3ML SOPN   insulin glargine (LANTUS SOLOSTAR) 100 UNIT/ML Solostar Pen   Has the patient contacted their pharmacy? Yes.   (Agent: If no, request that the patient contact the pharmacy for the refill.) (Agent: If yes, when and what did the pharmacy advise?)  Preferred Pharmacy (with phone number or street name):  [facility] - [place], [place] - [address]  [address] [place] [address]  Phone: [phone] Fax: [phone]     Agent: Please be advised that RX refills may take up to 3 business days. We ask that you follow-up with your pharmacy.

## 2019-11-09 ENCOUNTER — Ambulatory Visit (INDEPENDENT_AMBULATORY_CARE_PROVIDER_SITE_OTHER): Payer: Self-pay | Admitting: Lactation Services

## 2019-11-09 ENCOUNTER — Other Ambulatory Visit: Payer: Self-pay

## 2019-11-09 DIAGNOSIS — Z712 Person consulting for explanation of examination or test findings: Secondary | ICD-10-CM

## 2019-11-09 DIAGNOSIS — Z3201 Encounter for pregnancy test, result positive: Secondary | ICD-10-CM

## 2019-11-09 LAB — POCT PREGNANCY, URINE: Preg Test, Ur: POSITIVE — AB

## 2019-11-09 NOTE — Progress Notes (Signed)
Pt here for walk in pregnancy test. Patient left urine for call back.   Pregnancy test +. Called patient with results. LMP 09/30/2019 EDC 07/06/2020  She reports she has been throwing up sometimes. She declines needing medication for it. Patient plans to go to [facility] to get PNV, she declined need for prescription.   Ectopic precautions reviewed and patient advised to go to [facility] for evaluation bleeding or severe abdominal pain present. Patient voiced understanding.   Message to front office sent to schedule patient for new OB intake and first OB appt. Patient aware they will be calling to schedule.

## 2019-11-18 NOTE — Progress Notes (Signed)
Patient ID: Jennessy Sandridge, female   DOB: 18-Apr-1990, 29 y.o.   MRN: [id number] Patient was assessed and managed by nursing staff during this encounter. I have reviewed the chart and agree with the documentation and plan. I have also made any necessary editorial changes.  Scheryl Darter, MD 11/18/2019 6:52 PM

## 2019-11-20 MED FILL — LANTUS SOLOSTAR 100 UNITS/M: 100 | 16 days supply | Qty: 9 | Fill #1

## 2019-12-06 ENCOUNTER — Ambulatory Visit: Payer: Self-pay | Admitting: Family Medicine

## 2019-12-12 ENCOUNTER — Other Ambulatory Visit: Payer: Self-pay

## 2019-12-12 ENCOUNTER — Telehealth (INDEPENDENT_AMBULATORY_CARE_PROVIDER_SITE_OTHER): Payer: Self-pay | Admitting: *Deleted

## 2019-12-12 DIAGNOSIS — O99210 Obesity complicating pregnancy, unspecified trimester: Secondary | ICD-10-CM | POA: Diagnosis not present

## 2019-12-12 DIAGNOSIS — O10919 Unspecified pre-existing hypertension complicating pregnancy, unspecified trimester: Secondary | ICD-10-CM | POA: Diagnosis not present

## 2019-12-12 DIAGNOSIS — O24319 Unspecified pre-existing diabetes mellitus in pregnancy, unspecified trimester: Secondary | ICD-10-CM

## 2019-12-12 DIAGNOSIS — O09899 Supervision of other high risk pregnancies, unspecified trimester: Secondary | ICD-10-CM

## 2019-12-12 DIAGNOSIS — O0990 Supervision of high risk pregnancy, unspecified, unspecified trimester: Secondary | ICD-10-CM | POA: Diagnosis not present

## 2019-12-12 NOTE — Progress Notes (Signed)
11:15 Avonne not connected virtually. I called [place] and informed her I am calling for her virtual visit. She confirmed 2 identifiers. I asked if she can connect with me virtually. She said yes, but she has done virtual visit before. I explained how to connect. Avry Roedl,RN  I connected with  Shirlee Latch on 12/12/19 at 11:15 AM EDT by Virtually and verified that I am speaking with the correct person using two identifiers.   I discussed the limitations, risks, security and privacy concerns of performing an evaluation and management service by virtually and the availability of in person appointments. I also discussed with the patient that there may be a patient responsible charge related to this service. The patient expressed understanding and agreed to proceed.   I explained I am completing her New OB Intake today. We discussed Her EDD and that it is based on  sure LMP . I reviewed her allergies, meds, OB History, Medical /Surgical history, and appropriate screenings. I informed her of [facility] services.    She is G3 P1102 with hx of Type 2 Diabetes, hx PTD, and C/S.  I explained we will have her take her blood pressure weekly during her pregnancy.  She verified she does not have a blood pressure cuff.  We discussed she has insurance with her husband ; but is not sure if she has prenatal coverage. She will call to find out and if she does not; she will plan to apply for pregnancy medicaid.  I explained we can send a prescription for a Blood pressure cuff if she gets medicaid but if she finds out she has coverage thru [employer] plan we ask ----that she buy a blood pressure cuff because private insurances do not cover blood pressure cuff.  I   Explained after her first ob appointment   we will have her take her blood pressure weekly . I  explained we will have her log her blood pressures into an app that I will send her called [employer].  The  app was sent to her while on phone.      I explained she will have  some visits in office and some virtually. She already has Sports coach.  I reviewed her new ob  appointment date/ time with her , our location and to wear mask,  I explained she will have a pelvic exam, ob bloodwork, hemoglobin a1C, cbg ,pap, and  genetic testing if desired,- she does want a panorama.  I explained I will scheduled an [place] at 19 weeks and she will see the appointment later in MyChart. She voices understanding.    Sheriann Newmann,RN 12/12/2019  11:24 AM

## 2019-12-12 NOTE — Patient Instructions (Signed)
  At [facility], we work as an integrated team, providing care to address both physical and emotional health. Your medical provider may refer you to see our Behavioral Health Clinician (BHC) on the same day you see your medical provider, as availability permits.  Our BHC is available to all patients, visits generally last between 20-30 minutes, but can be longer or shorter, depending on patient need. The BHC offers help with stress management, coping with symptoms of depression and anxiety, major life changes , sleep issues, changing risky behavior, grief and loss, life stress, working on personal life goals, and  behavioral health issues, as these all affect your overall health and wellness.  The BHC is NOT available for the following: FMLA paperwork, court-ordered evaluations, specialty assessments (custody or disability), letters to employers, or obtaining certification for an emotional support animal. The BHC does not provide long-term therapy. You have the right to refuse integrated behavioral health services, or to reschedule to see the BHC at a later date.  Exception: If you are having thoughts of suicide, we require that you either see the BHC for further assessment, or contract for safety with your medical provider. Confidentiality exception: If it is suspected that a child or disabled adult is being abused or neglected, we are required by law to report that to either Child Protective Services or Adult Protective Services.  If you have a diagnosis of Bipolar affective disorder, Schizophrenia, or recurrent Major depressive disorder, we will recommend that you establish care with a psychiatrist, as these are lifelong, chronic conditions, and we want your overall emotional health and medications to be more closely monitored. If you anticipate needing extended maternity leave due to mental illness, it it recommended you inform your medical provider, so  we can put in a referral to a  psychiatrist as soon as possible. The BHC is unable to recommend an extended maternity leave for mental health issues. Your medical provider or BHC may refer you to a therapist for ongoing, traditional therapy, or to a psychiatrist, for medication management, if it would benefit your overall health. Depending on your insurance, you may have a copay to see the BHC. If you are uninsured, it is recommended that you apply for financial assistance. (Forms may be requested at the front desk for in-person visits, via MyChart, or request a form during a virtual visit).  If you see the BHC more than 6 times, you will have to complete a comprehensive clinical assessment interview with the BHC to resume integrated services.  For virtual visits with the BHC, you must be physically in the state of [place] at the time of the visit. For example, if you live in [place], you will have to do an in-person visit with the BHC. If you are going out of the state or country for any reason, the BHC may see you virtually when you return to [place], but not while you are physically outside of [place].    

## 2019-12-19 ENCOUNTER — Other Ambulatory Visit: Payer: Self-pay | Admitting: Obstetrics & Gynecology

## 2019-12-19 ENCOUNTER — Other Ambulatory Visit: Payer: Self-pay

## 2019-12-19 ENCOUNTER — Encounter: Payer: Self-pay | Admitting: Obstetrics & Gynecology

## 2019-12-19 ENCOUNTER — Ambulatory Visit (INDEPENDENT_AMBULATORY_CARE_PROVIDER_SITE_OTHER): Payer: BC Managed Care – PPO | Admitting: Obstetrics & Gynecology

## 2019-12-19 ENCOUNTER — Other Ambulatory Visit (HOSPITAL_COMMUNITY)
Admission: RE | Admit: 2019-12-19 | Discharge: 2019-12-19 | Disposition: A | Discharge status: Home / Self Care | Payer: BC Managed Care – PPO | Source: Ambulatory Visit | Attending: Obstetrics & Gynecology | Admitting: Obstetrics & Gynecology

## 2019-12-19 VITALS — BP 135/89 | HR 82 | Wt 229.0 lb

## 2019-12-19 DIAGNOSIS — O0990 Supervision of high risk pregnancy, unspecified, unspecified trimester: Secondary | ICD-10-CM

## 2019-12-19 DIAGNOSIS — Z3A11 11 weeks gestation of pregnancy: Secondary | ICD-10-CM | POA: Insufficient documentation

## 2019-12-19 DIAGNOSIS — O99210 Obesity complicating pregnancy, unspecified trimester: Secondary | ICD-10-CM

## 2019-12-19 DIAGNOSIS — Z794 Long term (current) use of insulin: Secondary | ICD-10-CM

## 2019-12-19 DIAGNOSIS — O24319 Unspecified pre-existing diabetes mellitus in pregnancy, unspecified trimester: Secondary | ICD-10-CM

## 2019-12-19 DIAGNOSIS — Z6841 Body Mass Index (BMI) 40.0 and over, adult: Secondary | ICD-10-CM

## 2019-12-19 DIAGNOSIS — O09899 Supervision of other high risk pregnancies, unspecified trimester: Secondary | ICD-10-CM

## 2019-12-19 DIAGNOSIS — O0991 Supervision of high risk pregnancy, unspecified, first trimester: Secondary | ICD-10-CM | POA: Insufficient documentation

## 2019-12-19 DIAGNOSIS — E1165 Type 2 diabetes mellitus with hyperglycemia: Secondary | ICD-10-CM

## 2019-12-19 MED ORDER — ASPIRIN EC 81 MG PO TBEC: 81.0000 mg | DELAYED_RELEASE_TABLET | Freq: Every day | ORAL | 11 refills | Status: AC

## 2019-12-19 MED ORDER — LANTUS SOLOSTAR 100 UNIT/ML ~~LOC~~ SOPN: 58.0000 [IU] | PEN_INJECTOR | Freq: Every day | SUBCUTANEOUS | 3 refills | Status: DC

## 2019-12-19 MED FILL — LANTUS SOLOSTAR 100 UNITS/M: 100 | 25 days supply | Qty: 15 | Fill #0

## 2019-12-19 NOTE — Patient Instructions (Signed)
Type 1 or Type 2 Diabetes Mellitus During Pregnancy, Diagnosis Type 1 diabetes (type 1 diabetes mellitus) and type 2 diabetes (type 2 diabetes mellitus) are long-term (chronic) diseases. Your diabetes may be caused by one or both of these problems:  Your pancreas does not make enough of a hormone called insulin.  Your pancreas does not respond in a normal way to insulin that it makes. Insulin lets sugars (glucose) go into cells in the body. This gives you energy. If you have diabetes, sugars cannot get into cells. This causes high blood sugar (hyperglycemia). If diabetes is treated, it may not hurt you or your baby. Your doctor will set treatment goals for you. In general, you should have these blood sugar levels:  After not eating for a long time (fasting): 95 mg/dL (5.3 mmol/L).  After meals (postprandial): ? One hour after a meal: at or below 140 mg/dL (7.8 mmol/L). ? Two hours after a meal: at or below 120 mg/dL (6.7 mmol/L).  A1c (hemoglobin A1c) level: 6-6.5%. Follow these instructions at home: Questions to ask your doctor  You may want to ask these questions: ? Do I need to meet with a diabetes educator? ? Where can I find a support group for people with diabetes? ? What equipment will I need to care for myself at home? ? What medicines do I need? When should I take them? ? How often do I need to check my blood sugar? ? What number can I call if I have questions? ? When is my next doctor's visit? General instructions  Take over-the-counter and prescription medicines only as told by your doctor.  Stay at a healthy weight during pregnancy.  Keep all follow-up visits as told by your doctor. This is important. Contact a doctor if:  Your blood sugar is at or above 240 mg/dL (13.3 mmol/L).  Your blood sugar is at or above 200 mg/dL (11.1 mmol/L), and you have ketones in your pee (urine).  You have been sick or have had a fever for 2 days or more and you are not getting  better.  You have any of these problems for more than 6 hours: ? You cannot eat or drink. ? You feel sick to your stomach (nauseous). ? You throw up (vomit). ? You have watery poop (diarrhea). Get help right away if:  Your blood sugar is lower than 54 mg/dL (3 mmol/L).  You get confused.  You have trouble: ? Thinking clearly. ? Breathing.  Your baby moves less than normal.  You have: ? Moderate or large ketone levels in your pee. ? Vaginal bleeding. ? Unusual fluid coming from your vagina. ? Early contractions. These may feel like tightness in your belly. Summary  Type 1 diabetes (type 1 diabetes mellitus) and type 2 diabetes (type 2 diabetes mellitus) are long-term (chronic) diseases.  If diabetes is treated, it may not hurt you or your baby.  Your doctor will set treatment goals for you. This information is not intended to replace advice given to you by your health care provider. Make sure you discuss any questions you have with your health care provider. Document Revised: 07/19/2018 Document Reviewed: 05/03/2015 Elsevier Patient Education  2020 [employer] Inc[employer]  

## 2019-12-19 NOTE — Progress Notes (Signed)
Subjective:considering TOLAC, 2 prior CS    Andrea [name] is a [id number] [redacted]w[redacted]d being seen today for her first obstetrical visit.  Her obstetrical history is significant for previous CS, Type 2 DM. Patient does intend to breast feed. Pregnancy history fully reviewed.  Patient reports no complaints.  Vitals:   12/19/19 1017  BP: 135/89  Pulse: 82  Weight: 229 lb (103.9 kg)    HISTORY: OB History  Gravida Para Term Preterm AB Living  3 2 1 1  0 2  SAB TAB Ectopic Multiple Live Births  0 0 0 0 2    # Outcome Date GA Lbr Len/2nd Weight Sex Delivery Anes PTL Lv  3 Current           2 Term 02/06/18 [redacted]w[redacted]d  6 lb 15.6 oz (3.165 kg) M CS-Vac EPI  LIV     Birth Comments: repeat c/s after failed IOL  1 Preterm 09/16/15 [redacted]w[redacted]d  4 lb 4.8 oz (1.95 kg) M CS-LTranv EPI  LIV     Birth Comments: "emergency" c/s with PPROM/NRFHR; was in hospital on bedrest about 1 week   Past Medical History:  Diagnosis Date  . Anemia    as a teenager  . Chicken pox   . Diabetes mellitus    type 2 on insulin  . Impacted teeth with abnormal position    impacted wisdom teeth   Past Surgical History:  Procedure Laterality Date  . CESAREAN SECTION N/A 09/16/2015   Procedure: CESAREAN SECTION;  Surgeon: 11/16/2015, MD;  Location: [facility] BIRTHING SUITES;  Service: Obstetrics;  Laterality: N/A;  . CESAREAN SECTION N/A 02/06/2018   Procedure: CESAREAN SECTION;  Surgeon: 02/08/2018, MD;  Location: [facility] BIRTHING SUITES;  Service: Obstetrics;  Laterality: N/A;  . FOREIGN BODY REMOVAL Left 08/28/2015   Procedure: REMOVAL FOREIGN BODY foot;  Surgeon: 08/30/2015, MD;  Location: [facility] OR;  Service: Orthopedics;  Laterality: Left;  regional block  . NO PAST SURGERIES    . TOOTH EXTRACTION N/A 07/24/2016   Procedure: EXTRACTION WISDOM TEETH ONE, SIXTEEN, SEVENTEEN AND THIRTY-TWO;  Surgeon: 07/26/2016, DDS;  Location: [facility] OR;  Service: Oral Surgery;  Laterality: N/A;   Family History  Problem Relation Age of Onset  .  Stroke Father   . Hypertension Father   . Diabetes Mellitus II Father   . Irregular heart beat Father   . Heart disease Father   . Hyperlipidemia Father   . Diabetes Mellitus II Mother   . Hyperlipidemia Mother   . Thyroid disease Mother   . Hypertension Maternal Aunt   . Thyroid disease Maternal Aunt   . Diabetes Mellitus II Maternal Grandmother   . Anesthesia problems Neg Hx      Exam    Uterus:     Pelvic Exam:    Perineum: No Hemorrhoids   Vulva: normal   Vagina:  normal mucosa   pH:     Cervix: no lesions   Adnexa: normal adnexa   Bony Pelvis: average  System:     Skin: normal coloration and turgor, no rashes    Neurologic: oriented, normal mood   Extremities: normal strength, tone, and muscle mass   HEENT PERRLA and thyroid without masses   Mouth/Teeth dental hygiene good   Neck supple   Cardiovascular: regular rate and rhythm, no murmurs or gallops   Respiratory:  appears well, vitals normal, no respiratory distress, acyanotic, normal RR, neck free of mass or lymphadenopathy, chest clear, no wheezing, crepitations,  rhonchi, normal symmetric air entry   Abdomen: soft, non-tender; bowel sounds normal; no masses,  no organomegaly   Urinary: urethral meatus normal      Assessment:    Pregnancy: [id number] Patient Active Problem List   Diagnosis Date Noted  . Supervision of high risk pregnancy, antepartum 12/12/2019  . Preexisting diabetes complicating pregnancy, antepartum 12/12/2019  . Obesity in pregnancy 08/23/2017  . History of preterm delivery, currently pregnant 07/26/2017  . Healthcare maintenance 06/01/2016  . BMI 40.0-44.9, adult (HCC) 02/25/2016  . Diabetes mellitus type 2, uncontrolled, without complications 10/31/2012        Plan:     Initial labs drawn. Prenatal vitamins. Problem list reviewed and updated. Genetic Screening discussed Horizon and [facility] Ultrasound discussed; fetal survey: ordered.  Follow up in 4 weeks. 50% of 30 min  visit spent on counseling and coordination of care.  DM management consult, changed Lantus dose. ASA 81 mg after 12 weeks   Scheryl Darter 12/19/2019

## 2019-12-20 LAB — CBC/D/PLT+RPR+RH+ABO+RUB AB...
Antibody Screen: NEGATIVE
Basophils Absolute: 0 10*3/uL (ref 0.0–0.2)
Basos: 0 %
EOS (ABSOLUTE): 0.1 10*3/uL (ref 0.0–0.4)
Eos: 1 %
HCV Ab: <0.1 s/co ratio (ref 0.0–0.9)
HIV Screen 4th Generation wRfx: NONREACTIVE
Hematocrit: 36.3 % (ref 34.0–46.6)
Hemoglobin: 11.6 g/dL (ref 11.1–15.9)
Hepatitis B Surface Ag: NEGATIVE
Immature Grans (Abs): 0 10*3/uL (ref 0.0–0.1)
Immature Granulocytes: 0 %
Lymphocytes Absolute: 2.2 10*3/uL (ref 0.7–3.1)
Lymphs: 29 %
MCH: 25.1 pg — ABNORMAL LOW (ref 26.6–33.0)
MCHC: 32 g/dL (ref 31.5–35.7)
MCV: 79 fL (ref 79–97)
Monocytes Absolute: 0.4 10*3/uL (ref 0.1–0.9)
Monocytes: 6 %
Neutrophils Absolute: 4.8 10*3/uL (ref 1.4–7.0)
Neutrophils: 64 %
Platelets: 342 10*3/uL (ref 150–450)
RBC: 4.62 x10E6/uL (ref 3.77–5.28)
RDW: 17.3 % — ABNORMAL HIGH (ref 11.7–15.4)
RPR Ser Ql: NONREACTIVE
Rh Factor: POSITIVE
Rubella Antibodies, IGG: 0.91 index — ABNORMAL LOW (ref >0.99)
WBC: 7.5 10*3/uL (ref 3.4–10.8)

## 2019-12-20 LAB — COMPREHENSIVE METABOLIC PANEL
ALT: 21 IU/L (ref 0–32)
AST: 15 IU/L (ref 0–40)
Albumin/Globulin Ratio: 1.5 (ref 1.2–2.2)
Albumin: 4.1 g/dL (ref 3.9–5.0)
Alkaline Phosphatase: 62 IU/L (ref 48–121)
BUN/Creatinine Ratio: 8 — ABNORMAL LOW (ref 9–23)
BUN: 5 mg/dL — ABNORMAL LOW (ref 6–20)
Bilirubin Total: 0.3 mg/dL (ref 0.0–1.2)
CO2: 20 mmol/L (ref 20–29)
Calcium: 9.5 mg/dL (ref 8.7–10.2)
Chloride: 102 mmol/L (ref 96–106)
Creatinine, Ser: 0.66 mg/dL (ref 0.57–1.00)
GFR calc Af Amer: 138 mL/min/{1.73_m2} (ref >59)
GFR calc non Af Amer: 120 mL/min/{1.73_m2} (ref >59)
Globulin, Total: 2.7 g/dL (ref 1.5–4.5)
Glucose: 66 mg/dL (ref 65–99)
Potassium: 3.8 mmol/L (ref 3.5–5.2)
Sodium: 136 mmol/L (ref 134–144)
Total Protein: 6.8 g/dL (ref 6.0–8.5)

## 2019-12-20 LAB — PROTEIN / CREATININE RATIO, URINE
Creatinine, Urine: 107.7 mg/dL
Protein, Ur: 9.8 mg/dL
Protein/Creat Ratio: 91 mg/g creat (ref 0–200)

## 2019-12-20 LAB — HEMOGLOBIN A1C
Est. average glucose Bld gHb Est-mCnc: 183 mg/dL
Hgb A1c MFr Bld: 8 % — ABNORMAL HIGH (ref 4.8–5.6)

## 2019-12-20 LAB — HCV INTERPRETATION

## 2019-12-21 LAB — CYTOLOGY - PAP
Chlamydia: NEGATIVE
Comment: NEGATIVE
Comment: NEGATIVE
Comment: NORMAL
Diagnosis: NEGATIVE
Diagnosis: REACTIVE
High risk HPV: NEGATIVE
Neisseria Gonorrhea: NEGATIVE

## 2019-12-21 LAB — CULTURE, OB URINE

## 2019-12-21 LAB — URINE CULTURE, OB REFLEX

## 2019-12-26 ENCOUNTER — Encounter: Payer: Self-pay | Admitting: General Practice

## 2019-12-27 MED FILL — LANTUS SOLOSTAR 100 UNITS/M: 100 | 25 days supply | Qty: 15 | Fill #0

## 2020-01-03 ENCOUNTER — Telehealth (INDEPENDENT_AMBULATORY_CARE_PROVIDER_SITE_OTHER): Payer: BC Managed Care – PPO | Admitting: Lactation Services

## 2020-01-03 DIAGNOSIS — O0990 Supervision of high risk pregnancy, unspecified, unspecified trimester: Secondary | ICD-10-CM

## 2020-01-03 NOTE — Telephone Encounter (Signed)
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   Mob reports she was aware she is a carrier and FOB has been previously tested and is negative. They have 2 other children together that tested to also be carrier for Sickle Cell.   Patient has no questions or concerns and feels she does not need genetic counseling since FOB found to be negative.   Patient to call with any questions or concerns as needed.

## 2020-01-08 ENCOUNTER — Encounter: Payer: Self-pay | Admitting: *Deleted

## 2020-01-11 ENCOUNTER — Encounter: Payer: Self-pay | Admitting: *Deleted

## 2020-01-11 ENCOUNTER — Ambulatory Visit: Payer: BC Managed Care – PPO | Admitting: Dietician

## 2020-01-16 ENCOUNTER — Other Ambulatory Visit: Payer: Self-pay

## 2020-01-16 ENCOUNTER — Ambulatory Visit (INDEPENDENT_AMBULATORY_CARE_PROVIDER_SITE_OTHER): Payer: BC Managed Care – PPO | Admitting: Obstetrics & Gynecology

## 2020-01-16 VITALS — BP 115/76 | HR 93 | Wt 250.7 lb

## 2020-01-16 DIAGNOSIS — O24319 Unspecified pre-existing diabetes mellitus in pregnancy, unspecified trimester: Secondary | ICD-10-CM

## 2020-01-16 DIAGNOSIS — O0990 Supervision of high risk pregnancy, unspecified, unspecified trimester: Secondary | ICD-10-CM

## 2020-01-16 LAB — POCT URINALYSIS DIP (DEVICE)
Bilirubin Urine: NEGATIVE
Glucose, UA: NEGATIVE mg/dL
Hgb urine dipstick: NEGATIVE
Ketones, ur: NEGATIVE mg/dL
Leukocytes,Ua: NEGATIVE
Nitrite: NEGATIVE
Protein, ur: NEGATIVE mg/dL
Specific Gravity, Urine: >=1.03 (ref 1.005–1.030)
Urobilinogen, UA: 1 mg/dL (ref 0.0–1.0)
pH: 5.5 (ref 5.0–8.0)

## 2020-01-16 NOTE — Progress Notes (Signed)
   PRENATAL VISIT NOTE  Subjective:  Andrea [name] is a 29 y.o. [phone] at 109w3d being seen today for ongoing prenatal care.  She is currently monitored for the following issues for this high-risk pregnancy and has Diabetes mellitus type 2, uncontrolled, without complications; BMI 40.0-44.9, adult (HCC); Healthcare maintenance; History of preterm delivery, currently pregnant; Obesity in pregnancy; Supervision of high risk pregnancy, antepartum; and Preexisting diabetes complicating pregnancy, antepartum on their problem list.  Patient reports no complaints.  Contractions: Not present. Vag. Bleeding: None.  Movement: Absent. Denies leaking of fluid.   The following portions of the patient's history were reviewed and updated as appropriate: allergies, current medications, past family history, past medical history, past social history, past surgical history and problem list.   Objective:   Vitals:   01/16/20 1030  BP: 115/76  Pulse: 93  Weight: 250 lb 11.2 oz (113.7 kg)    Fetal Status: Fetal Heart Rate (bpm): 150   Movement: Absent     General:  Alert, oriented and cooperative. Patient is in no acute distress.  Skin: Skin is warm and dry. No rash noted.   Cardiovascular: Normal heart rate noted  Respiratory: Normal respiratory effort, no problems with respiration noted  Abdomen: Soft, gravid, appropriate for gestational age.  Pain/Pressure: Absent     Pelvic: Cervical exam deferred        Extremities: Normal range of motion.  Edema: None  Mental Status: Normal mood and affect. Normal behavior. Normal judgment and thought content.   Assessment and Plan:  Pregnancy: G3P1102 at [redacted]w[redacted]d 1. Supervision of high risk pregnancy, antepartum Screening ordered - AFP, Serum, Open Spina Bifida  2. Preexisting diabetes complicating pregnancy, antepartum States FBS and PP are in range and we will continue her present insulin  Preterm labor symptoms and general obstetric precautions including  but not limited to vaginal bleeding, contractions, leaking of fluid and fetal movement were reviewed in detail with the patient. Please refer to After Visit Summary for other counseling recommendations.   Return in about 4 weeks (around 02/13/2020).  Future Appointments  Date Time Provider Department Center  02/12/2020  8:45 AM [facility] NURSE [facility]  02/12/2020  9:00 AM [facility] US1 [facility]    Scheryl Darter, MD

## 2020-01-16 NOTE — Patient Instructions (Signed)

## 2020-01-16 NOTE — Progress Notes (Signed)
open

## 2020-01-17 LAB — AFP, SERUM, OPEN SPINA BIFIDA
AFP MoM: 1.99
AFP Value: 39.7 ng/mL
Gest. Age on Collection Date: 15.3 weeks
Maternal Age At EDD: 30.4 yr
OSBR Risk 1 IN: 492
Test Results:: NEGATIVE
Weight: 251 [lb_av]

## 2020-01-18 IMAGING — US US MFM FETAL BPP W/ NON-STRESS
1 series · 10 of 10 positions shown · non-contrast
Comparison: none

[Series 1: us mfm fetal bpp w/ non-stress · 10 acquisitions, 10 frames shown]
[im 1/10]
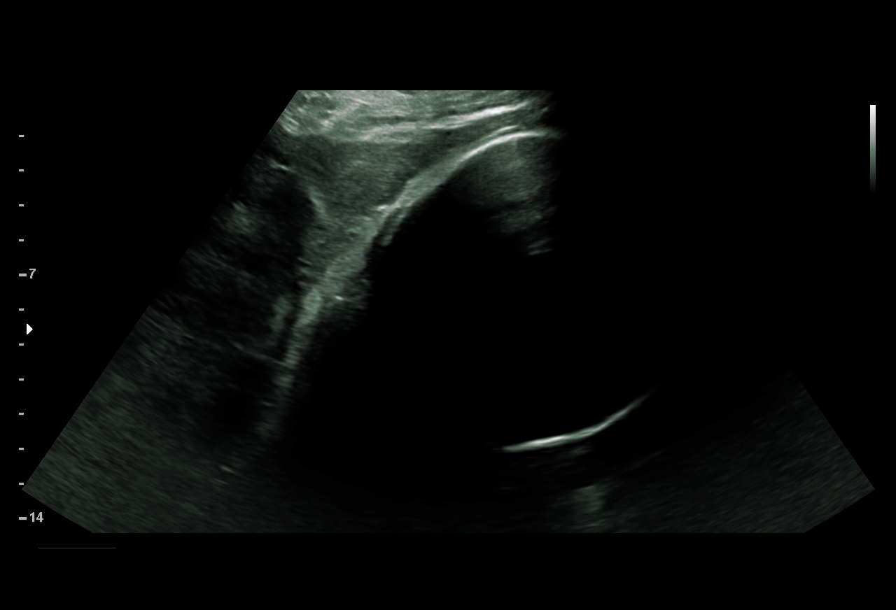
[im 2/10]
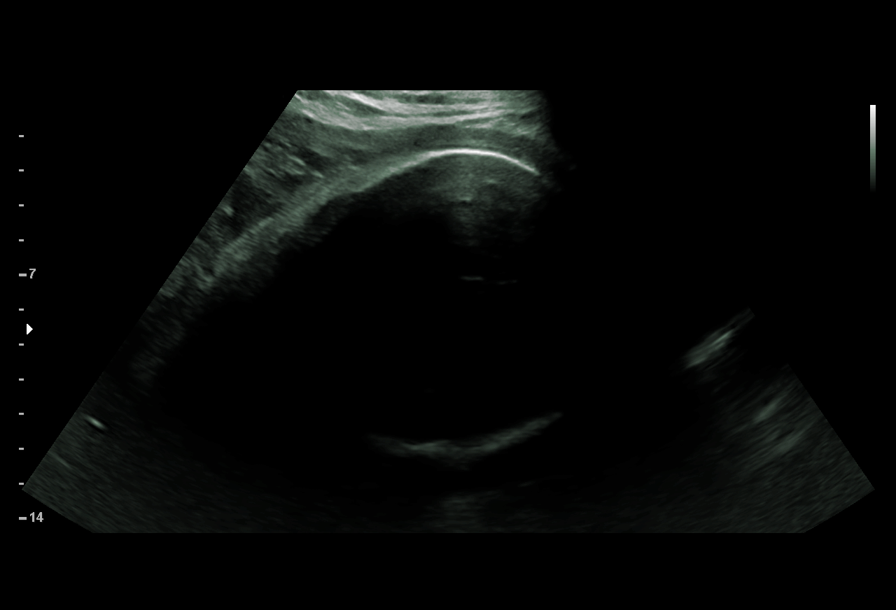
[im 3/10]
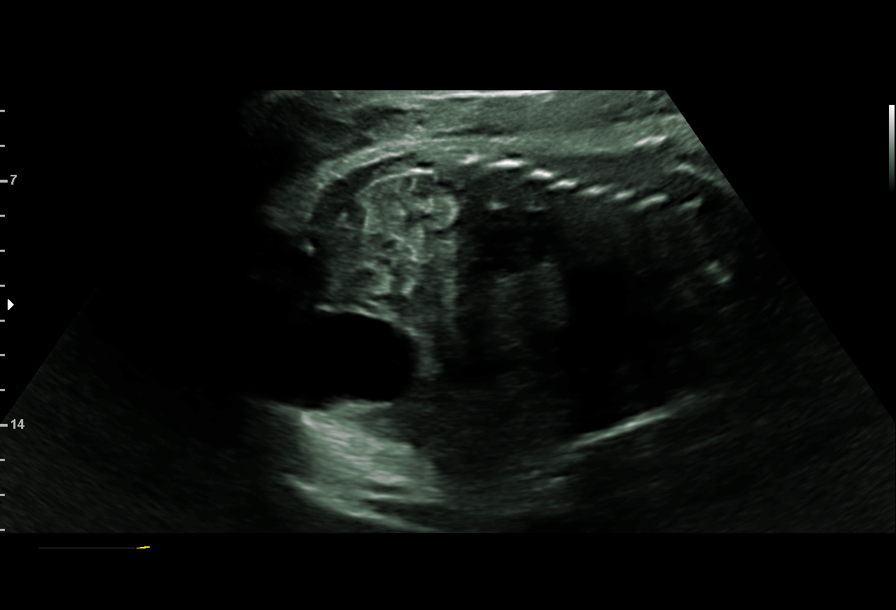
[im 4/10]
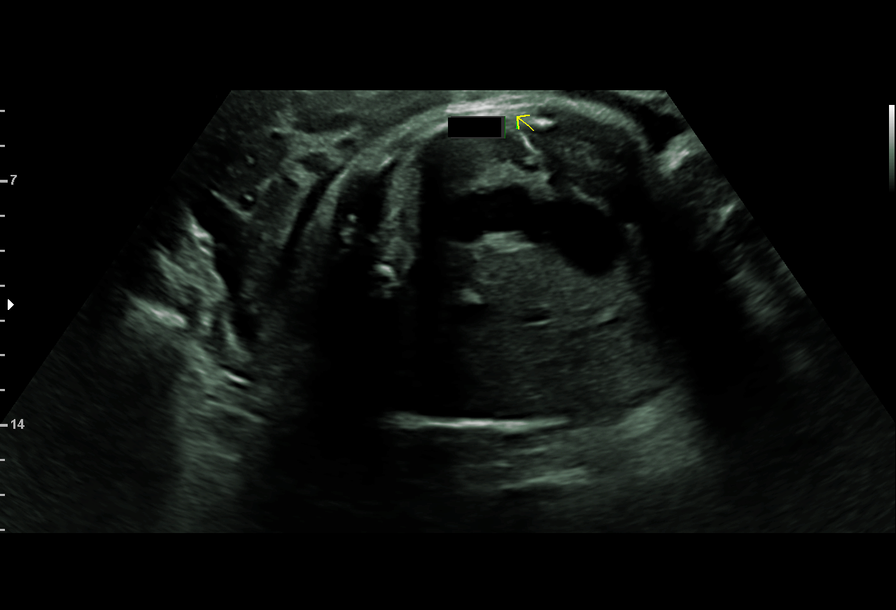
[im 5/10]
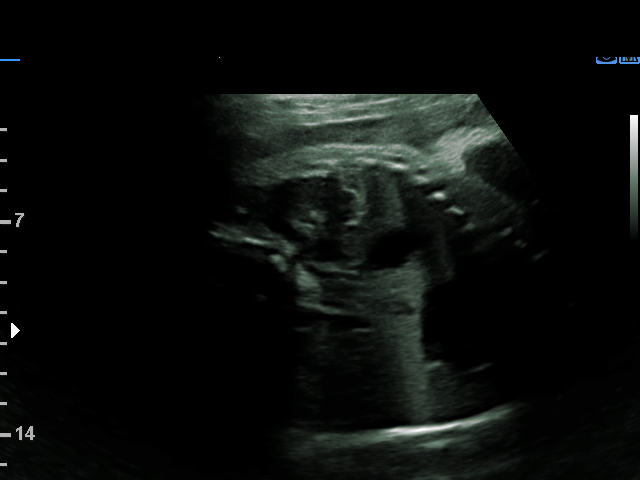
[im 6/10]
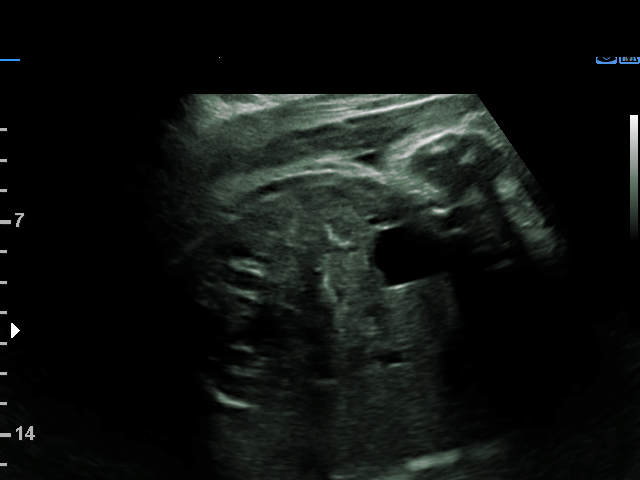
[im 7/10]
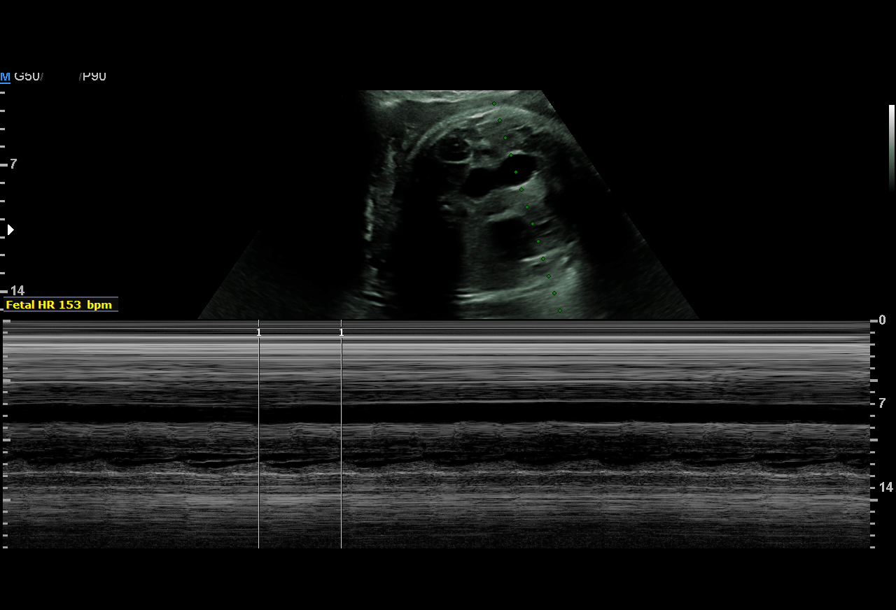
[im 8/10]
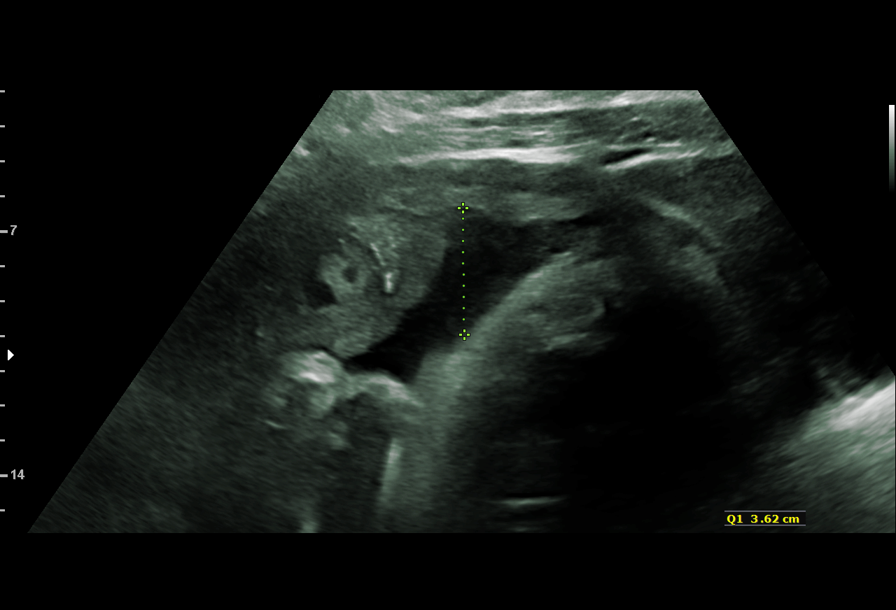
[im 9/10]
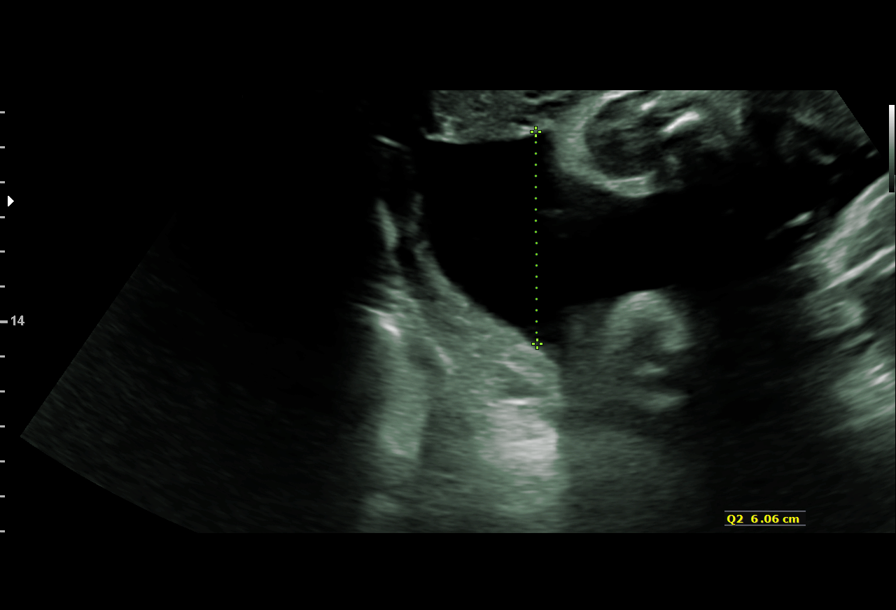
[im 10/10]
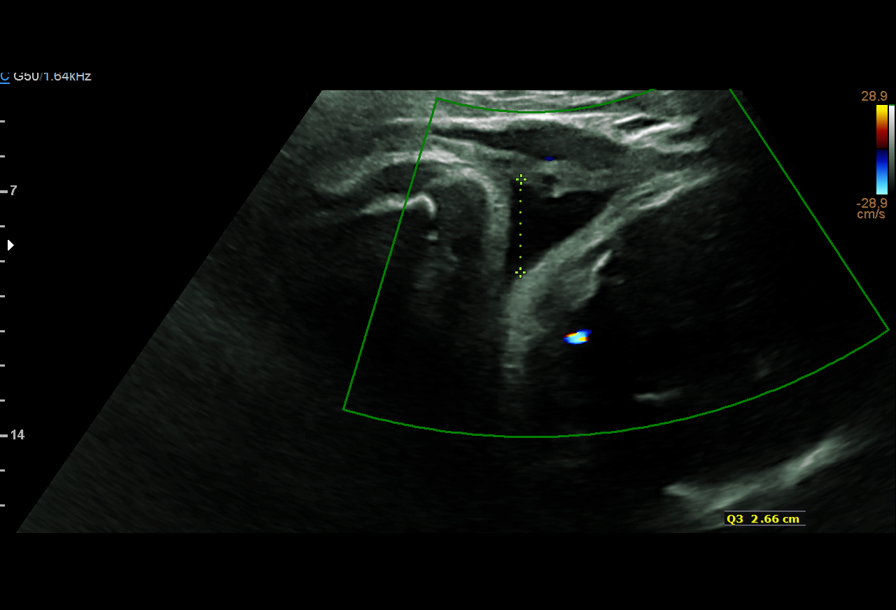

[10 of 10 positions shown; findings below may reference images not displayed]

OB/Gyn Clinic
Ref. Address:     [REDACTED]

W/NONSTRESS

Indications

Pre-existing diabetes, type 2, in pregnancy,
third trimester (insulin)
37 weeks gestation of pregnancy
Previous cesarean delivery, antepartum
Poor obstetric history: Previous preterm
delivery, antepartum (PPROM @ 22wks)
Obesity complicating pregnancy, third
trimester
Vital Signs

Height:        5'1"
Fetal Evaluation

Num Of Fetuses:         1
Fetal Heart Rate(bpm):  153
Cardiac Activity:       Observed
Presentation:           Cephalic
Amniotic Fluid
AFI FV:      Within normal limits

AFI Sum(cm)     %Tile       Largest Pocket(cm)
12.34           43

RUQ(cm)                     LUQ(cm)        LLQ(cm)
3.62
Biophysical Evaluation

Amniotic F.V:   Within normal limits       F. Tone:        Observed
F. Movement:    Observed                   N.S.T:          Reactive
F. Breathing:   Observed                   Score:          [DATE]
OB History

Gravidity:    2         Term:   0        Prem:   1        SAB:   0
TOP:          0       Ectopic:  0        Living: 1
Gestational Age

LMP:           37w 4d        Date:  05/08/17                 EDD:   02/12/18
Best:          37w 4d     Det. By:  LMP  (05/08/17)          EDD:   02/12/18
Anatomy

Stomach:               Appears normal, left   Bladder:                Appears normal
sided
Comments

U/S images reviewed. Findings reviewed with patient.   No
evidence of fetal compromise is found on BPP today.  No
fetal abnormalities are seen.
Questions answered.
10 minutes spent face to face with patient.
Recommendations: 1) Serial U/S every 4 weeks for fetal
growth 2) Weekly BPP
Recommendations

1) Serial U/S every 4 weeks for fetal growth 2) Weekly BPP

## 2020-01-18 NOTE — Progress Notes (Signed)
Patient ID: Andrea [name], female   DOB: 10/19/1990, 29 y.o.   MRN: [id number] Patient was assessed and managed by nursing staff during this encounter. I have reviewed the chart and agree with the documentation and plan. I have also made any necessary editorial changes.  Scheryl Darter, MD 01/18/2020 7:46 PM

## 2020-02-08 MED FILL — LANTUS SOLOSTAR 100 UNITS/M: 100 | 25 days supply | Qty: 15 | Fill #1

## 2020-02-12 ENCOUNTER — Ambulatory Visit: Payer: Self-pay | Admitting: *Deleted

## 2020-02-12 ENCOUNTER — Ambulatory Visit: Payer: Self-pay | Attending: Obstetrics & Gynecology

## 2020-02-12 ENCOUNTER — Other Ambulatory Visit: Payer: Self-pay | Admitting: *Deleted

## 2020-02-12 ENCOUNTER — Other Ambulatory Visit: Payer: Self-pay

## 2020-02-12 ENCOUNTER — Encounter: Payer: Self-pay | Admitting: *Deleted

## 2020-02-12 VITALS — BP 133/71 | HR 96

## 2020-02-12 DIAGNOSIS — O0990 Supervision of high risk pregnancy, unspecified, unspecified trimester: Secondary | ICD-10-CM

## 2020-02-12 DIAGNOSIS — O24319 Unspecified pre-existing diabetes mellitus in pregnancy, unspecified trimester: Secondary | ICD-10-CM

## 2020-02-12 DIAGNOSIS — O09899 Supervision of other high risk pregnancies, unspecified trimester: Secondary | ICD-10-CM

## 2020-02-12 DIAGNOSIS — Z6841 Body Mass Index (BMI) 40.0 and over, adult: Secondary | ICD-10-CM

## 2020-02-15 ENCOUNTER — Encounter: Payer: BC Managed Care – PPO | Admitting: Obstetrics & Gynecology

## 2020-02-23 ENCOUNTER — Other Ambulatory Visit: Payer: Self-pay

## 2020-02-23 ENCOUNTER — Ambulatory Visit (INDEPENDENT_AMBULATORY_CARE_PROVIDER_SITE_OTHER): Payer: Self-pay | Admitting: Family Medicine

## 2020-02-23 VITALS — BP 146/92 | HR 96 | Wt 255.3 lb

## 2020-02-23 DIAGNOSIS — O99210 Obesity complicating pregnancy, unspecified trimester: Secondary | ICD-10-CM

## 2020-02-23 DIAGNOSIS — O169 Unspecified maternal hypertension, unspecified trimester: Secondary | ICD-10-CM

## 2020-02-23 DIAGNOSIS — O09899 Supervision of other high risk pregnancies, unspecified trimester: Secondary | ICD-10-CM

## 2020-02-23 DIAGNOSIS — Z98891 History of uterine scar from previous surgery: Secondary | ICD-10-CM

## 2020-02-23 DIAGNOSIS — O24319 Unspecified pre-existing diabetes mellitus in pregnancy, unspecified trimester: Secondary | ICD-10-CM

## 2020-02-23 DIAGNOSIS — Z6841 Body Mass Index (BMI) 40.0 and over, adult: Secondary | ICD-10-CM

## 2020-02-23 DIAGNOSIS — O0990 Supervision of high risk pregnancy, unspecified, unspecified trimester: Secondary | ICD-10-CM

## 2020-02-23 LAB — POCT URINALYSIS DIP (DEVICE)
Bilirubin Urine: NEGATIVE
Glucose, UA: NEGATIVE mg/dL
Hgb urine dipstick: NEGATIVE
Ketones, ur: NEGATIVE mg/dL
Leukocytes,Ua: NEGATIVE
Nitrite: NEGATIVE
Protein, ur: NEGATIVE mg/dL
Specific Gravity, Urine: 1.02 (ref 1.005–1.030)
Urobilinogen, UA: 0.2 mg/dL (ref 0.0–1.0)
pH: 5.5 (ref 5.0–8.0)

## 2020-02-23 NOTE — Progress Notes (Signed)
   Subjective:  Andrea [name] is a 29 y.o. [phone] at [redacted]w[redacted]d being seen today for ongoing prenatal care.  She is currently monitored for the following issues for this high-risk pregnancy and has Diabetes mellitus type 2, uncontrolled, without complications; History of cesarean section; BMI 40.0-44.9, adult (HCC); Healthcare maintenance; History of preterm delivery, currently pregnant; Obesity in pregnancy; Supervision of high risk pregnancy, antepartum; and Preexisting diabetes complicating pregnancy, antepartum on their problem list.  Patient reports no complaints.  Contractions: Not present. Vag. Bleeding: None.  Movement: Present. Denies leaking of fluid.   Denies headache, vision changes, chest pain, SOB, RUQ pain, LE edema  The following portions of the patient's history were reviewed and updated as appropriate: allergies, current medications, past family history, past medical history, past social history, past surgical history and problem list. Problem list updated.  Objective:   Vitals:   02/23/20 1058 02/23/20 1100  BP: (!) 150/102 (!) 146/92  Pulse: 97 96  Weight: 255 lb 4.8 oz (115.8 kg)     Fetal Status: Fetal Heart Rate (bpm): 145   Movement: Present     General:  Alert, oriented and cooperative. Patient is in no acute distress.  Skin: Skin is warm and dry. No rash noted.   Cardiovascular: Normal heart rate noted  Respiratory: Normal respiratory effort, no problems with respiration noted  Abdomen: Soft, gravid, appropriate for gestational age. Pain/Pressure: Present     Pelvic: Vag. Bleeding: None     Cervical exam deferred        Extremities: Normal range of motion.  Edema: None  Mental Status: Normal mood and affect. Normal behavior. Normal judgment and thought content.   Urinalysis:      Assessment and Plan:  Pregnancy: G3P1102 at [redacted]w[redacted]d  1. Supervision of high risk pregnancy, antepartum BP elevated today, see separate  2. Preexisting diabetes complicating  pregnancy, antepartum Did not bring her log fastings controlled and post prandials borderline Interested in a pump, will coordinate Last [place] 02/12/2020 with borderline growth at 11%, otherwise normal Referred for fetal echo at that visit, done today and results are normal  3. BMI 40.0-44.9, adult (HCC)  4. History of preterm delivery, currently pregnant Iatrogenic secondary to PPROM/NRFHR, second delivery at 39 wks  5. Obesity in pregnancy  6. Elevated blood pressure complicating pregnancy, antepartum Two mild range BP's in office, no prior elevated BP's or problems with HTN in prior pregnancies Asymptomatic Does not have BP cuff at home yet, urged to get one ASAP, will draw PreE labs today RTC in 2 weeks for nurse BP check, or can be virtual if obtains home cuff  Preterm labor symptoms and general obstetric precautions including but not limited to vaginal bleeding, contractions, leaking of fluid and fetal movement were reviewed in detail with the patient. Please refer to After Visit Summary for other counseling recommendations.  Return in 4 weeks (on 03/22/2020) for [facility], needs MD.   Venora Maples, MD

## 2020-02-23 NOTE — Patient Instructions (Signed)
 Second Trimester of Pregnancy The second trimester is from week 14 through week 27 (months 4 through 6). The second trimester is often a time when you feel your best. Your body has adjusted to being pregnant, and you begin to feel better physically. Usually, morning sickness has lessened or quit completely, you may have more energy, and you may have an increase in appetite. The second trimester is also a time when the fetus is growing rapidly. At the end of the sixth month, the fetus is about 9 inches long and weighs about 1 pounds. You will likely begin to feel the baby move (quickening) between 16 and 20 weeks of pregnancy. Body changes during your second trimester Your body continues to go through many changes during your second trimester. The changes vary from woman to woman.  Your weight will continue to increase. You will notice your lower abdomen bulging out.  You may begin to get stretch marks on your hips, abdomen, and breasts.  You may develop headaches that can be relieved by medicines. The medicines should be approved by your health care provider.  You may urinate more often because the fetus is pressing on your bladder.  You may develop or continue to have heartburn as a result of your pregnancy.  You may develop constipation because certain hormones are causing the muscles that push waste through your intestines to slow down.  You may develop hemorrhoids or swollen, bulging veins (varicose veins).  You may have back pain. This is caused by: ? Weight gain. ? Pregnancy hormones that are relaxing the joints in your pelvis. ? A shift in weight and the muscles that support your balance.  Your breasts will continue to grow and they will continue to become tender.  Your gums may bleed and may be sensitive to brushing and flossing.  Dark spots or blotches (chloasma, mask of pregnancy) may develop on your face. This will likely fade after the baby is born.  A dark line from  your belly button to the pubic area (linea nigra) may appear. This will likely fade after the baby is born.  You may have changes in your hair. These can include thickening of your hair, rapid growth, and changes in texture. Some women also have hair loss during or after pregnancy, or hair that feels dry or thin. Your hair will most likely return to normal after your baby is born. What to expect at prenatal visits During a routine prenatal visit:  You will be weighed to make sure you and the fetus are growing normally.  Your blood pressure will be taken.  Your abdomen will be measured to track your baby's growth.  The fetal heartbeat will be listened to.  Any test results from the previous visit will be discussed. Your health care provider may ask you:  How you are feeling.  If you are feeling the baby move.  If you have had any abnormal symptoms, such as leaking fluid, bleeding, severe headaches, or abdominal cramping.  If you are using any tobacco products, including cigarettes, chewing tobacco, and electronic cigarettes.  If you have any questions. Other tests that may be performed during your second trimester include:  Blood tests that check for: ? Low iron levels (anemia). ? High blood sugar that affects pregnant women (gestational diabetes) between 24 and 28 weeks. ? Rh antibodies. This is to check for a protein on red blood cells (Rh factor).  Urine tests to check for infections, diabetes, or protein in   the urine.  An ultrasound to confirm the proper growth and development of the baby.  An amniocentesis to check for possible genetic problems.  Fetal screens for spina bifida and Down syndrome.  HIV (human immunodeficiency virus) testing. Routine prenatal testing includes screening for HIV, unless you choose not to have this test. Follow these instructions at home: Medicines  Follow your health care provider's instructions regarding medicine use. Specific medicines  may be either safe or unsafe to take during pregnancy.  Take a prenatal vitamin that contains at least 600 micrograms (mcg) of folic acid.  If you develop constipation, try taking a stool softener if your health care provider approves. Eating and drinking   Eat a balanced diet that includes fresh fruits and vegetables, whole grains, good sources of protein such as meat, eggs, or tofu, and low-fat dairy. Your health care provider will help you determine the amount of weight gain that is right for you.  Avoid raw meat and uncooked cheese. These carry germs that can cause birth defects in the baby.  If you have low calcium intake from food, talk to your health care provider about whether you should take a daily calcium supplement.  Limit foods that are high in fat and processed sugars, such as fried and sweet foods.  To prevent constipation: ? Drink enough fluid to keep your urine clear or pale yellow. ? Eat foods that are high in fiber, such as fresh fruits and vegetables, whole grains, and beans. Activity  Exercise only as directed by your health care provider. Most women can continue their usual exercise routine during pregnancy. Try to exercise for 30 minutes at least 5 days a week. Stop exercising if you experience uterine contractions.  Avoid heavy lifting, wear low heel shoes, and practice good posture.  A sexual relationship may be continued unless your health care provider directs you otherwise. Relieving pain and discomfort  Wear a good support bra to prevent discomfort from breast tenderness.  Take warm sitz baths to soothe any pain or discomfort caused by hemorrhoids. Use hemorrhoid cream if your health care provider approves.  Rest with your legs elevated if you have leg cramps or low back pain.  If you develop varicose veins, wear support hose. Elevate your feet for 15 minutes, 3-4 times a day. Limit salt in your diet. Prenatal Care  Write down your questions. Take  them to your prenatal visits.  Keep all your prenatal visits as told by your health care provider. This is important. Safety  Wear your seat belt at all times when driving.  Make a list of emergency phone numbers, including numbers for family, friends, the hospital, and police and fire departments. General instructions  Ask your health care provider for a referral to a local prenatal education class. Begin classes no later than the beginning of month 6 of your pregnancy.  Ask for help if you have counseling or nutritional needs during pregnancy. Your health care provider can offer advice or refer you to specialists for help with various needs.  Do not use hot tubs, steam rooms, or saunas.  Do not douche or use tampons or scented sanitary pads.  Do not cross your legs for long periods of time.  Avoid cat litter boxes and soil used by cats. These carry germs that can cause birth defects in the baby and possibly loss of the fetus by miscarriage or stillbirth.  Avoid all smoking, herbs, alcohol, and unprescribed drugs. Chemicals in these products can affect the   formation and growth of the baby.  Do not use any products that contain nicotine or tobacco, such as cigarettes and e-cigarettes. If you need help quitting, ask your health care provider.  Visit your dentist if you have not gone yet during your pregnancy. Use a soft toothbrush to brush your teeth and be gentle when you floss. Contact a health care provider if:  You have dizziness.  You have mild pelvic cramps, pelvic pressure, or nagging pain in the abdominal area.  You have persistent nausea, vomiting, or diarrhea.  You have a bad smelling vaginal discharge.  You have pain when you urinate. Get help right away if:  You have a fever.  You are leaking fluid from your vagina.  You have spotting or bleeding from your vagina.  You have severe abdominal cramping or pain.  You have rapid weight gain or weight loss.  You  have shortness of breath with chest pain.  You notice sudden or extreme swelling of your face, hands, ankles, feet, or legs.  You have not felt your baby move in over an hour.  You have severe headaches that do not go away when you take medicine.  You have vision changes. Summary  The second trimester is from week 14 through week 27 (months 4 through 6). It is also a time when the fetus is growing rapidly.  Your body goes through many changes during pregnancy. The changes vary from woman to woman.  Avoid all smoking, herbs, alcohol, and unprescribed drugs. These chemicals affect the formation and growth your baby.  Do not use any tobacco products, such as cigarettes, chewing tobacco, and e-cigarettes. If you need help quitting, ask your health care provider.  Contact your health care provider if you have any questions. Keep all prenatal visits as told by your health care provider. This is important. This information is not intended to replace advice given to you by your health care provider. Make sure you discuss any questions you have with your health care provider. Document Revised: 07/22/2018 Document Reviewed: 05/05/2016 [employer] Patient Education  2020 [employer]   Contraception Choices Contraception, also called birth control, refers to methods or devices that prevent pregnancy. Hormonal methods Contraceptive implant  A contraceptive implant is a thin, plastic tube that contains a hormone. It is inserted into the upper part of the arm. It can remain in place for up to 3 years. Progestin-only injections Progestin-only injections are injections of progestin, a synthetic form of the hormone progesterone. They are given every 3 months by a health care provider. Birth control pills  Birth control pills are pills that contain hormones that prevent pregnancy. They must be taken once a day, preferably at the same time each day. Birth control patch  The birth control patch  contains hormones that prevent pregnancy. It is placed on the skin and must be changed once a week for three weeks and removed on the fourth week. A prescription is needed to use this method of contraception. Vaginal ring  A vaginal ring contains hormones that prevent pregnancy. It is placed in the vagina for three weeks and removed on the fourth week. After that, the process is repeated with a new ring. A prescription is needed to use this method of contraception. Emergency contraceptive Emergency contraceptives prevent pregnancy after unprotected sex. They come in pill form and can be taken up to 5 days after sex. They work best the sooner they are taken after having sex. Most emergency contraceptives are available   without a prescription. This method should not be used as your only form of birth control. Barrier methods Female condom  A female condom is a thin sheath that is worn over the penis during sex. Condoms keep sperm from going inside a woman's body. They can be used with a spermicide to increase their effectiveness. They should be disposed after a single use. Female condom  A female condom is a soft, loose-fitting sheath that is put into the vagina before sex. The condom keeps sperm from going inside a woman's body. They should be disposed after a single use. Diaphragm  A diaphragm is a soft, dome-shaped barrier. It is inserted into the vagina before sex, along with a spermicide. The diaphragm blocks sperm from entering the uterus, and the spermicide kills sperm. A diaphragm should be left in the vagina for 6-8 hours after sex and removed within 24 hours. A diaphragm is prescribed and fitted by a health care provider. A diaphragm should be replaced every 1-2 years, after giving birth, after gaining more than 15 lb (6.8 kg), and after pelvic surgery. Cervical cap  A cervical cap is a round, soft latex or plastic cup that fits over the cervix. It is inserted into the vagina before sex, along  with spermicide. It blocks sperm from entering the uterus. The cap should be left in place for 6-8 hours after sex and removed within 48 hours. A cervical cap must be prescribed and fitted by a health care provider. It should be replaced every 2 years. Sponge  A sponge is a soft, circular piece of polyurethane foam with spermicide on it. The sponge helps block sperm from entering the uterus, and the spermicide kills sperm. To use it, you make it wet and then insert it into the vagina. It should be inserted before sex, left in for at least 6 hours after sex, and removed and thrown away within 30 hours. Spermicides Spermicides are chemicals that kill or block sperm from entering the cervix and uterus. They can come as a cream, jelly, suppository, foam, or tablet. A spermicide should be inserted into the vagina with an applicator at least 10-15 minutes before sex to allow time for it to work. The process must be repeated every time you have sex. Spermicides do not require a prescription. Intrauterine contraception Intrauterine device (IUD) An IUD is a T-shaped device that is put in a woman's uterus. There are two types:  Hormone IUD.This type contains progestin, a synthetic form of the hormone progesterone. This type can stay in place for 3-5 years.  Copper IUD.This type is wrapped in copper wire. It can stay in place for 10 years.  Permanent methods of contraception Female tubal ligation In this method, a woman's fallopian tubes are sealed, tied, or blocked during surgery to prevent eggs from traveling to the uterus. Hysteroscopic sterilization In this method, a small, flexible insert is placed into each fallopian tube. The inserts cause scar tissue to form in the fallopian tubes and block them, so sperm cannot reach an egg. The procedure takes about 3 months to be effective. Another form of birth control must be used during those 3 months. Female sterilization This is a procedure to tie off the  tubes that carry sperm (vasectomy). After the procedure, the man can still ejaculate fluid (semen). Natural planning methods Natural family planning In this method, a couple does not have sex on days when the woman could become pregnant. Calendar method This means keeping track of the length   of each menstrual cycle, identifying the days when pregnancy can happen, and not having sex on those days. Ovulation method In this method, a couple avoids sex during ovulation. Symptothermal method This method involves not having sex during ovulation. The woman typically checks for ovulation by watching changes in her temperature and in the consistency of cervical mucus. Post-ovulation method In this method, a couple waits to have sex until after ovulation. Summary  Contraception, also called birth control, means methods or devices that prevent pregnancy.  Hormonal methods of contraception include implants, injections, pills, patches, vaginal rings, and emergency contraceptives.  Barrier methods of contraception can include female condoms, female condoms, diaphragms, cervical caps, sponges, and spermicides.  There are two types of IUDs (intrauterine devices). An IUD can be put in a woman's uterus to prevent pregnancy for 3-5 years.  Permanent sterilization can be done through a procedure for males, females, or both.  Natural family planning methods involve not having sex on days when the woman could become pregnant. This information is not intended to replace advice given to you by your health care provider. Make sure you discuss any questions you have with your health care provider. Document Revised: 04/01/2017 Document Reviewed: 05/02/2016 [employer] Patient Education  2020 [employer]   Breastfeeding  Choosing to breastfeed is one of the best decisions you can make for yourself and your baby. A change in hormones during pregnancy causes your breasts to make breast milk in your milk-producing  glands. Hormones prevent breast milk from being released before your baby is born. They also prompt milk flow after birth. Once breastfeeding has begun, thoughts of your baby, as well as his or her sucking or crying, can stimulate the release of milk from your milk-producing glands. Benefits of breastfeeding Research shows that breastfeeding offers many health benefits for infants and mothers. It also offers a cost-free and convenient way to feed your baby. For your baby  Your first milk (colostrum) helps your baby's digestive system to function better.  Special cells in your milk (antibodies) help your baby to fight off infections.  Breastfed babies are less likely to develop asthma, allergies, obesity, or type 2 diabetes. They are also at lower risk for sudden infant death syndrome (SIDS).  Nutrients in breast milk are better able to meet your baby's needs compared to infant formula.  Breast milk improves your baby's brain development. For you  Breastfeeding helps to create a very special bond between you and your baby.  Breastfeeding is convenient. Breast milk costs nothing and is always available at the correct temperature.  Breastfeeding helps to burn calories. It helps you to lose the weight that you gained during pregnancy.  Breastfeeding makes your uterus return faster to its size before pregnancy. It also slows bleeding (lochia) after you give birth.  Breastfeeding helps to lower your risk of developing type 2 diabetes, osteoporosis, rheumatoid arthritis, cardiovascular disease, and breast, ovarian, uterine, and endometrial cancer later in life. Breastfeeding basics Starting breastfeeding  Find a comfortable place to sit or lie down, with your neck and back well-supported.  Place a pillow or a rolled-up blanket under your baby to bring him or her to the level of your breast (if you are seated). Nursing pillows are specially designed to help support your arms and your baby while  you breastfeed.  Make sure that your baby's tummy (abdomen) is facing your abdomen.  Gently massage your breast. With your fingertips, massage from the outer edges of your breast inward toward   the nipple. This encourages milk flow. If your milk flows slowly, you may need to continue this action during the feeding.  Support your breast with 4 fingers underneath and your thumb above your nipple (make the letter "C" with your hand). Make sure your fingers are well away from your nipple and your baby's mouth.  Stroke your baby's lips gently with your finger or nipple.  When your baby's mouth is open wide enough, quickly bring your baby to your breast, placing your entire nipple and as much of the areola as possible into your baby's mouth. The areola is the colored area around your nipple. ? More areola should be visible above your baby's upper lip than below the lower lip. ? Your baby's lips should be opened and extended outward (flanged) to ensure an adequate, comfortable latch. ? Your baby's tongue should be between his or her lower gum and your breast.  Make sure that your baby's mouth is correctly positioned around your nipple (latched). Your baby's lips should create a seal on your breast and be turned out (everted).  It is common for your baby to suck about 2-3 minutes in order to start the flow of breast milk. Latching Teaching your baby how to latch onto your breast properly is very important. An improper latch can cause nipple pain, decreased milk supply, and poor weight gain in your baby. Also, if your baby is not latched onto your nipple properly, he or she may swallow some air during feeding. This can make your baby fussy. Burping your baby when you switch breasts during the feeding can help to get rid of the air. However, teaching your baby to latch on properly is still the best way to prevent fussiness from swallowing air while breastfeeding. Signs that your baby has successfully  latched onto your nipple  Silent tugging or silent sucking, without causing you pain. Infant's lips should be extended outward (flanged).  Swallowing heard between every 3-4 sucks once your milk has started to flow (after your let-down milk reflex occurs).  Muscle movement above and in front of his or her ears while sucking. Signs that your baby has not successfully latched onto your nipple  Sucking sounds or smacking sounds from your baby while breastfeeding.  Nipple pain. If you think your baby has not latched on correctly, slip your finger into the corner of your baby's mouth to break the suction and place it between your baby's gums. Attempt to start breastfeeding again. Signs of successful breastfeeding Signs from your baby  Your baby will gradually decrease the number of sucks or will completely stop sucking.  Your baby will fall asleep.  Your baby's body will relax.  Your baby will retain a small amount of milk in his or her mouth.  Your baby will let go of your breast by himself or herself. Signs from you  Breasts that have increased in firmness, weight, and size 1-3 hours after feeding.  Breasts that are softer immediately after breastfeeding.  Increased milk volume, as well as a change in milk consistency and color by the fifth day of breastfeeding.  Nipples that are not sore, cracked, or bleeding. Signs that your baby is getting enough milk  Wetting at least 1-2 diapers during the first 24 hours after birth.  Wetting at least 5-6 diapers every 24 hours for the first week after birth. The urine should be clear or pale yellow by the age of 5 days.  Wetting 6-8 diapers every 24 hours as   your baby continues to grow and develop.  At least 3 stools in a 24-hour period by the age of 5 days. The stool should be soft and yellow.  At least 3 stools in a 24-hour period by the age of 7 days. The stool should be seedy and yellow.  No loss of weight greater than 10% of  birth weight during the first 3 days of life.  Average weight gain of 4-7 oz (113-198 g) per week after the age of 4 days.  Consistent daily weight gain by the age of 5 days, without weight loss after the age of 2 weeks. After a feeding, your baby may spit up a small amount of milk. This is normal. Breastfeeding frequency and duration Frequent feeding will help you make more milk and can prevent sore nipples and extremely full breasts (breast engorgement). Breastfeed when you feel the need to reduce the fullness of your breasts or when your baby shows signs of hunger. This is called "breastfeeding on demand." Signs that your baby is hungry include:  Increased alertness, activity, or restlessness.  Movement of the head from side to side.  Opening of the mouth when the corner of the mouth or cheek is stroked (rooting).  Increased sucking sounds, smacking lips, cooing, sighing, or squeaking.  Hand-to-mouth movements and sucking on fingers or hands.  Fussing or crying. Avoid introducing a pacifier to your baby in the first 4-6 weeks after your baby is born. After this time, you may choose to use a pacifier. Research has shown that pacifier use during the first year of a baby's life decreases the risk of sudden infant death syndrome (SIDS). Allow your baby to feed on each breast as long as he or she wants. When your baby unlatches or falls asleep while feeding from the first breast, offer the second breast. Because newborns are often sleepy in the first few weeks of life, you may need to awaken your baby to get him or her to feed. Breastfeeding times will vary from baby to baby. However, the following rules can serve as a guide to help you make sure that your baby is properly fed:  Newborns (babies 4 weeks of age or younger) may breastfeed every 1-3 hours.  Newborns should not go without breastfeeding for longer than 3 hours during the day or 5 hours during the night.  You should breastfeed  your baby a minimum of 8 times in a 24-hour period. Breast milk pumping     Pumping and storing breast milk allows you to make sure that your baby is exclusively fed your breast milk, even at times when you are unable to breastfeed. This is especially important if you go back to work while you are still breastfeeding, or if you are not able to be present during feedings. Your lactation consultant can help you find a method of pumping that works best for you and give you guidelines about how long it is safe to store breast milk. Caring for your breasts while you breastfeed Nipples can become dry, cracked, and sore while breastfeeding. The following recommendations can help keep your breasts moisturized and healthy:  Avoid using soap on your nipples.  Wear a supportive bra designed especially for nursing. Avoid wearing underwire-style bras or extremely tight bras (sports bras).  Air-dry your nipples for 3-4 minutes after each feeding.  Use only cotton bra pads to absorb leaked breast milk. Leaking of breast milk between feedings is normal.  Use lanolin on your nipples   after breastfeeding. Lanolin helps to maintain your skin's normal moisture barrier. Pure lanolin is not harmful (not toxic) to your baby. You may also hand express a few drops of breast milk and gently massage that milk into your nipples and allow the milk to air-dry. In the first few weeks after giving birth, some women experience breast engorgement. Engorgement can make your breasts feel heavy, warm, and tender to the touch. Engorgement peaks within 3-5 days after you give birth. The following recommendations can help to ease engorgement:  Completely empty your breasts while breastfeeding or pumping. You may want to start by applying warm, moist heat (in the shower or with warm, water-soaked hand towels) just before feeding or pumping. This increases circulation and helps the milk flow. If your baby does not completely empty your  breasts while breastfeeding, pump any extra milk after he or she is finished.  Apply ice packs to your breasts immediately after breastfeeding or pumping, unless this is too uncomfortable for you. To do this: ? Put ice in a plastic bag. ? Place a towel between your skin and the bag. ? Leave the ice on for 20 minutes, 2-3 times a day.  Make sure that your baby is latched on and positioned properly while breastfeeding. If engorgement persists after 48 hours of following these recommendations, contact your health care provider or a lactation consultant. Overall health care recommendations while breastfeeding  Eat 3 healthy meals and 3 snacks every day. Well-nourished mothers who are breastfeeding need an additional 450-500 calories a day. You can meet this requirement by increasing the amount of a balanced diet that you eat.  Drink enough water to keep your urine pale yellow or clear.  Rest often, relax, and continue to take your prenatal vitamins to prevent fatigue, stress, and low vitamin and mineral levels in your body (nutrient deficiencies).  Do not use any products that contain nicotine or tobacco, such as cigarettes and e-cigarettes. Your baby may be harmed by chemicals from cigarettes that pass into breast milk and exposure to secondhand smoke. If you need help quitting, ask your health care provider.  Avoid alcohol.  Do not use illegal drugs or marijuana.  Talk with your health care provider before taking any medicines. These include over-the-counter and prescription medicines as well as vitamins and herbal supplements. Some medicines that may be harmful to your baby can pass through breast milk.  It is possible to become pregnant while breastfeeding. If birth control is desired, ask your health care provider about options that will be safe while breastfeeding your baby. Where to find more information: [employer]: www.llli.org Contact a health care provider  if:  You feel like you want to stop breastfeeding or have become frustrated with breastfeeding.  Your nipples are cracked or bleeding.  Your breasts are red, tender, or warm.  You have: ? Painful breasts or nipples. ? A swollen area on either breast. ? A fever or chills. ? Nausea or vomiting. ? Drainage other than breast milk from your nipples.  Your breasts do not become full before feedings by the fifth day after you give birth.  You feel sad and depressed.  Your baby is: ? Too sleepy to eat well. ? Having trouble sleeping. ? More than 1 week old and wetting fewer than 6 diapers in a 24-hour period. ? Not gaining weight by 5 days of age.  Your baby has fewer than 3 stools in a 24-hour period.  Your baby's skin or   the white parts of his or her eyes become yellow. Get help right away if:  Your baby is overly tired (lethargic) and does not want to wake up and feed.  Your baby develops an unexplained fever. Summary  Breastfeeding offers many health benefits for infant and mothers.  Try to breastfeed your infant when he or she shows early signs of hunger.  Gently tickle or stroke your baby's lips with your finger or nipple to allow the baby to open his or her mouth. Bring the baby to your breast. Make sure that much of the areola is in your baby's mouth. Offer one side and burp the baby before you offer the other side.  Talk with your health care provider or lactation consultant if you have questions or you face problems as you breastfeed. This information is not intended to replace advice given to you by your health care provider. Make sure you discuss any questions you have with your health care provider. Document Revised: 06/24/2017 Document Reviewed: 05/01/2016 Elsevier Patient Education  2020 Elsevier Inc.  

## 2020-02-24 LAB — CBC
Hematocrit: 34.3 % (ref 34.0–46.6)
Hemoglobin: 11.4 g/dL (ref 11.1–15.9)
MCH: 27.1 pg (ref 26.6–33.0)
MCHC: 33.2 g/dL (ref 31.5–35.7)
MCV: 82 fL (ref 79–97)
Platelets: 335 10*3/uL (ref 150–450)
RBC: 4.21 x10E6/uL (ref 3.77–5.28)
RDW: 14.5 % (ref 11.7–15.4)
WBC: 7.8 10*3/uL (ref 3.4–10.8)

## 2020-02-24 LAB — COMPREHENSIVE METABOLIC PANEL
ALT: 9 IU/L (ref 0–32)
AST: 14 IU/L (ref 0–40)
Albumin/Globulin Ratio: 1.2 (ref 1.2–2.2)
Albumin: 3.7 g/dL — ABNORMAL LOW (ref 3.9–5.0)
Alkaline Phosphatase: 64 IU/L (ref 44–121)
BUN/Creatinine Ratio: 5 — ABNORMAL LOW (ref 9–23)
BUN: 3 mg/dL — ABNORMAL LOW (ref 6–20)
Bilirubin Total: <0.2 mg/dL (ref 0.0–1.2)
CO2: 18 mmol/L — ABNORMAL LOW (ref 20–29)
Calcium: 9 mg/dL (ref 8.7–10.2)
Chloride: 102 mmol/L (ref 96–106)
Creatinine, Ser: 0.63 mg/dL (ref 0.57–1.00)
GFR calc Af Amer: 139 mL/min/{1.73_m2} (ref >59)
GFR calc non Af Amer: 121 mL/min/{1.73_m2} (ref >59)
Globulin, Total: 3 g/dL (ref 1.5–4.5)
Glucose: 235 mg/dL — ABNORMAL HIGH (ref 65–99)
Potassium: 4 mmol/L (ref 3.5–5.2)
Sodium: 136 mmol/L (ref 134–144)
Total Protein: 6.7 g/dL (ref 6.0–8.5)

## 2020-02-24 LAB — PROTEIN / CREATININE RATIO, URINE
Creatinine, Urine: 94.4 mg/dL
Protein, Ur: 10 mg/dL
Protein/Creat Ratio: 106 mg/g creat (ref 0–200)

## 2020-03-04 ENCOUNTER — Telehealth: Payer: Self-pay | Admitting: General Practice

## 2020-03-04 MED FILL — LANTUS SOLOSTAR 100 UNITS/M: 100 | 25 days supply | Qty: 15 | Fill #2

## 2020-03-04 NOTE — Telephone Encounter (Signed)
Received call from [employer] regarding patient had several elevated blood pressures over the weekend at home. Blood pressures at home have been: 147/86, 148/80, 149/86, 143/83. Per chart review, patient had elevated blood pressures at initial prenatal visit and was to follow up in office for repeat BP check. Patient has appt tomorrow for blood pressure check and diabetes education. Called patient to check in on her and she denies headaches, dizziness or blurry vision. Told patient to bring cuff to office tomorrow so we can compare for accuracy. Patient verbalized understanding.

## 2020-03-05 ENCOUNTER — Other Ambulatory Visit: Payer: Self-pay

## 2020-03-11 ENCOUNTER — Telehealth: Payer: Self-pay | Admitting: *Deleted

## 2020-03-11 NOTE — Telephone Encounter (Addendum)
Call received from [employer] representative. She stated that pt had triggered an elevated BP reading today of 148/92. Pt reported to [employer] that she was not having any sx. Notation was also made from clinical staff e-mail messages that pt has had multiple BP elevations over the last 6 days. Pt could not be called regarding the BP elevation triggers due to the office was closed for Thanksgiving holiday. I called pt who stated that she is feeling well and she denied H/A or visual disturbances. Pt was unable to re- check BP at the time of my call however stated that she would check it again prior to bedtime. Per chart review, pt has scheduled [place] @ [facility] tomorrow.   11/30  1110  Per chart review, pt had appt @ [facility] today for [place]. Her BP was 139/70.

## 2020-03-12 ENCOUNTER — Encounter: Payer: Self-pay | Admitting: *Deleted

## 2020-03-12 ENCOUNTER — Ambulatory Visit: Payer: Self-pay | Admitting: Registered"

## 2020-03-12 ENCOUNTER — Ambulatory Visit: Payer: Self-pay | Attending: Obstetrics and Gynecology

## 2020-03-12 ENCOUNTER — Other Ambulatory Visit: Payer: Self-pay | Admitting: *Deleted

## 2020-03-12 ENCOUNTER — Encounter: Payer: Self-pay | Attending: Obstetrics & Gynecology | Admitting: Registered"

## 2020-03-12 ENCOUNTER — Ambulatory Visit: Payer: Self-pay | Admitting: *Deleted

## 2020-03-12 ENCOUNTER — Other Ambulatory Visit: Payer: Self-pay

## 2020-03-12 VITALS — BP 139/70 | HR 99

## 2020-03-12 DIAGNOSIS — O99212 Obesity complicating pregnancy, second trimester: Secondary | ICD-10-CM

## 2020-03-12 DIAGNOSIS — O24312 Unspecified pre-existing diabetes mellitus in pregnancy, second trimester: Secondary | ICD-10-CM

## 2020-03-12 DIAGNOSIS — O34219 Maternal care for unspecified type scar from previous cesarean delivery: Secondary | ICD-10-CM

## 2020-03-12 DIAGNOSIS — O24419 Gestational diabetes mellitus in pregnancy, unspecified control: Secondary | ICD-10-CM | POA: Insufficient documentation

## 2020-03-12 DIAGNOSIS — O24112 Pre-existing diabetes mellitus, type 2, in pregnancy, second trimester: Secondary | ICD-10-CM | POA: Insufficient documentation

## 2020-03-12 DIAGNOSIS — O09212 Supervision of pregnancy with history of pre-term labor, second trimester: Secondary | ICD-10-CM

## 2020-03-12 DIAGNOSIS — Z362 Encounter for other antenatal screening follow-up: Secondary | ICD-10-CM

## 2020-03-12 DIAGNOSIS — O24319 Unspecified pre-existing diabetes mellitus in pregnancy, unspecified trimester: Secondary | ICD-10-CM

## 2020-03-12 DIAGNOSIS — E119 Type 2 diabetes mellitus without complications: Secondary | ICD-10-CM

## 2020-03-12 DIAGNOSIS — O09899 Supervision of other high risk pregnancies, unspecified trimester: Secondary | ICD-10-CM | POA: Insufficient documentation

## 2020-03-12 DIAGNOSIS — Z862 Personal history of diseases of the blood and blood-forming organs and certain disorders involving the immune mechanism: Secondary | ICD-10-CM

## 2020-03-12 DIAGNOSIS — Z3A23 23 weeks gestation of pregnancy: Secondary | ICD-10-CM

## 2020-03-12 DIAGNOSIS — O24912 Unspecified diabetes mellitus in pregnancy, second trimester: Secondary | ICD-10-CM

## 2020-03-12 NOTE — Progress Notes (Signed)
Patient was seen on 03/12/20 for T2DM in pregnancy self-management. EDD 07/06/20, [redacted]w[redacted]d Patient states T2DM diagnosis in 2012. Patient states her last pregnancy she was able to have good glucose control with just Lantus.  Pt states she injects 55 units of Lantus in the evening. Pt states she doesn't want to "mess with Humalog because it can drop your sugar and nothing anyone can do about it". Patient reports the lowest she has had in the last 2 weeks is mid [id number] has been in the morning (fasting?). Pt states she wants to get gastric bypass surgery after she recovers from this pregnancy so she won't have to use insulin anymore.  Diet history obtained. Patient eats variety of all food groups. However, does not eat consistent carb diet. Breakfast usually mostly protein, but them may have pasta for dinner and fruit for snacks. Beverages include water, a little juicy juice (WIC provides).  Patient reports walking for structured physical activity.  The following learning objectives were met by the patient :   States why dietary management is important in controlling blood glucose  Describes the effects of carbohydrates on blood glucose levels  Demonstrates ability to create a balanced meal plan  Demonstrates carbohydrate counting   States when to check blood glucose levels  Demonstrates proper blood glucose monitoring techniques  States the effect of stress and exercise on blood glucose levels  States the importance of limiting caffeine and abstaining from alcohol and smoking  Plan:  Aim for 3 Carbohydrate Choices per meal (45 grams) +/- 1 either way  Aim for 1-2 Carbohydrate Choices per snack Begin reading food labels for Total Carbohydrate of foods If OK with your MD, consider  increasing your activity level by walking, Arm Chair Exercises or other activity daily as tolerated Begin checking Blood Glucose before breakfast and 2 hours after first bite of breakfast, lunch and dinner as  directed by MD  Bring Log Book/Sheet and meter to every medical appointment  Baby Scripts: (BS 2.0 not capable of glucose management at this time.) Patient to record blood sugar on glucose log sheet  Take medication if directed by MD  Patient already has a meter, True Metrix that she received from [facility], and is testing sporadically. Pt states she does not like pricking her fingers and very interested in continuous glucose monitor.   Pt reported readings: 2 hrs post breakfast 80-90s  Patient instructed to monitor glucose levels: FBS: 70 - 95 mg/dl 2 hour: 100<120 mg/dl  Patient received the following handouts:  Nutrition Diabetes and Pregnancy  Carbohydrate Counting List  Blood glucose Log Sheet  Patient will be seen for follow-up 1 in weeks or as needed.

## 2020-03-13 ENCOUNTER — Encounter: Payer: Self-pay | Admitting: General Practice

## 2020-03-13 ENCOUNTER — Telehealth: Payer: Self-pay | Admitting: General Practice

## 2020-03-13 DIAGNOSIS — O10919 Unspecified pre-existing hypertension complicating pregnancy, unspecified trimester: Secondary | ICD-10-CM | POA: Insufficient documentation

## 2020-03-13 DIAGNOSIS — O139 Gestational [pregnancy-induced] hypertension without significant proteinuria, unspecified trimester: Secondary | ICD-10-CM | POA: Insufficient documentation

## 2020-03-13 NOTE — Telephone Encounter (Signed)
Received alert from [employer] regarding elevated blood pressure of 137/97. Reviewed all elevated blood pressures with Dr Crissie Reese who states patient does meet criteria for gestational HTN- patient does not need sooner OB appt.   Called patient & discussed with her. Patient verbalized understanding. Discussed she only needs to check her BP once a week unless symptomatic. Reviewed concerning signs/symptoms with patient and when to report. Patient verbalized understanding to all & had no questions.

## 2020-03-14 ENCOUNTER — Encounter: Payer: Self-pay | Admitting: *Deleted

## 2020-03-22 ENCOUNTER — Encounter: Payer: Self-pay | Admitting: Family Medicine

## 2020-03-26 ENCOUNTER — Other Ambulatory Visit: Payer: Self-pay

## 2020-03-26 ENCOUNTER — Encounter: Payer: Self-pay | Attending: Obstetrics & Gynecology | Admitting: Registered"

## 2020-03-26 ENCOUNTER — Ambulatory Visit: Payer: Self-pay | Admitting: Registered"

## 2020-03-26 ENCOUNTER — Ambulatory Visit (INDEPENDENT_AMBULATORY_CARE_PROVIDER_SITE_OTHER): Payer: Self-pay | Admitting: Obstetrics & Gynecology

## 2020-03-26 VITALS — BP 128/66 | HR 91 | Wt 258.7 lb

## 2020-03-26 DIAGNOSIS — Z6841 Body Mass Index (BMI) 40.0 and over, adult: Secondary | ICD-10-CM

## 2020-03-26 DIAGNOSIS — O24319 Unspecified pre-existing diabetes mellitus in pregnancy, unspecified trimester: Secondary | ICD-10-CM

## 2020-03-26 DIAGNOSIS — O0990 Supervision of high risk pregnancy, unspecified, unspecified trimester: Secondary | ICD-10-CM

## 2020-03-26 DIAGNOSIS — O132 Gestational [pregnancy-induced] hypertension without significant proteinuria, second trimester: Secondary | ICD-10-CM

## 2020-03-26 DIAGNOSIS — E1165 Type 2 diabetes mellitus with hyperglycemia: Secondary | ICD-10-CM

## 2020-03-26 DIAGNOSIS — Z3A25 25 weeks gestation of pregnancy: Secondary | ICD-10-CM

## 2020-03-26 DIAGNOSIS — Z794 Long term (current) use of insulin: Secondary | ICD-10-CM

## 2020-03-26 DIAGNOSIS — O3662X1 Maternal care for excessive fetal growth, second trimester, fetus 1: Secondary | ICD-10-CM

## 2020-03-26 DIAGNOSIS — O24419 Gestational diabetes mellitus in pregnancy, unspecified control: Secondary | ICD-10-CM | POA: Insufficient documentation

## 2020-03-26 NOTE — Progress Notes (Signed)
Patient was seen on 03/26/20 for follow-up assessment and education for T2DM in pregnancy . EDD 07/06/20; [redacted]w[redacted]d.   Patient reports she is using 55 units of Lantus in the evening  Patient is  testing blood glucose but did not bring log book with her.  The following learning objectives reviewed during follow-up visit:   Importance of record keeping and bringing to appointments  Reviewed dietary and lifestyle factors that affect blood sugar  Plan:  Marland Kitchen Aim to check blood sugar regularly . After insurance is active we'll discuss options for CGM and other insulin options   Patient instructed to monitor glucose levels: FBS: 60 - 95 mg/dl 2 hour: <637 mg/dl  Patient received the following handouts:  none  Patient will be seen for follow-up after insurance is active or as needed.

## 2020-03-26 NOTE — Progress Notes (Signed)
   PRENATAL VISIT NOTE  Subjective:  Andrea [name] is a 29 y.o. [phone] at [redacted]w[redacted]d being seen today for ongoing prenatal care.  She is currently monitored for the following issues for this high-risk pregnancy and has Diabetes mellitus type 2, uncontrolled, without complications; History of cesarean section; BMI 40.0-44.9, adult (HCC); Healthcare maintenance; History of preterm delivery, currently pregnant; Obesity in pregnancy; Supervision of high risk pregnancy, antepartum; Preexisting diabetes complicating pregnancy, antepartum; Elevated blood pressure complicating pregnancy, antepartum; and Gestational hypertension on their problem list.  Patient reports no complaints.  Contractions: Not present. Vag. Bleeding: None.  Movement: Present. Denies leaking of fluid.   Pt did not bring BSs with her.  Reports her fasting bs in the 70-90.  She reports post prandials are in the 80 at lunch and 120's after dinner.  Giving 55 units of Lantus qhs.    The following portions of the patient's history were reviewed and updated as appropriate: allergies, current medications, past family history, past medical history, past social history, past surgical history and problem list.   Objective:   Vitals:   03/26/20 1051 03/26/20 1054  BP: (!) 143/83 128/66  Pulse: 91 91  Weight: 258 lb 11.2 oz (117.3 kg)     Fetal Status: Fetal Heart Rate (bpm): 141 Fundal Height: 33 cm Movement: Present     General:  Alert, oriented and cooperative. Patient is in no acute distress.  Skin: Skin is warm and dry. No rash noted.   Cardiovascular: Normal heart rate noted  Respiratory: Normal respiratory effort, no problems with respiration noted  Abdomen: Soft, gravid, appropriate for gestational age.  Pain/Pressure: Present     Pelvic: Cervical exam deferred        Extremities: Normal range of motion.  Edema: None  Mental Status: Normal mood and affect. Normal behavior. Normal judgment and thought content.   Assessment  and Plan:  Pregnancy: G3P1102 at [redacted]w[redacted]d 1. [redacted] weeks gestation of pregnancy - AFP normal -HIV/RPR/CBC at next visit.  Plan Tdap then as well.  2. Supervision of high risk pregnancy, antepartum -cont baby ASA -Growth scan scheduled at about 27 1/2 weeks, 04/09/2020  3. BMI 40.0-44.9, adult (HCC) - Current weight gain is 19#  4. Type 2 diabetes mellitus with hyperglycemia, with long-term current use of insulin (HCC) -Meeting with diabetic educator today to possibly consider continuous gluocse monitor  5. LGA (large for gestational age) fetus affecting management of mother, second trimester, fetus 1 -Growth scan is scheduled 04/09/2020  6. Gestational hypertension - Pt is going to bring cuff at [facility] cuff here gave normal value today.  If correct cuff size, will likely need to start BP management. - Nursing visit scheduled in 2 weeks.   Preterm labor symptoms and general obstetric precautions including but not limited to vaginal bleeding, contractions, leaking of fluid and fetal movement were reviewed in detail with the patient. Please refer to After Visit Summary for other counseling recommendations.    Future Appointments  Date Time Provider Department Center  04/09/2020  9:30 AM [facility] NURSE [facility]  04/09/2020  9:45 AM [facility]5 [facility]    Jerene Bears, MD

## 2020-04-09 ENCOUNTER — Other Ambulatory Visit: Payer: Self-pay | Admitting: *Deleted

## 2020-04-09 ENCOUNTER — Encounter: Payer: Self-pay | Admitting: *Deleted

## 2020-04-09 ENCOUNTER — Ambulatory Visit: Payer: Self-pay | Attending: Obstetrics and Gynecology

## 2020-04-09 ENCOUNTER — Ambulatory Visit: Payer: Self-pay | Admitting: *Deleted

## 2020-04-09 ENCOUNTER — Other Ambulatory Visit: Payer: Self-pay

## 2020-04-09 DIAGNOSIS — Z362 Encounter for other antenatal screening follow-up: Secondary | ICD-10-CM

## 2020-04-09 DIAGNOSIS — O132 Gestational [pregnancy-induced] hypertension without significant proteinuria, second trimester: Secondary | ICD-10-CM

## 2020-04-09 DIAGNOSIS — Z862 Personal history of diseases of the blood and blood-forming organs and certain disorders involving the immune mechanism: Secondary | ICD-10-CM

## 2020-04-09 DIAGNOSIS — O99212 Obesity complicating pregnancy, second trimester: Secondary | ICD-10-CM

## 2020-04-09 DIAGNOSIS — O24312 Unspecified pre-existing diabetes mellitus in pregnancy, second trimester: Secondary | ICD-10-CM

## 2020-04-09 DIAGNOSIS — O09212 Supervision of pregnancy with history of pre-term labor, second trimester: Secondary | ICD-10-CM

## 2020-04-09 DIAGNOSIS — E119 Type 2 diabetes mellitus without complications: Secondary | ICD-10-CM

## 2020-04-09 DIAGNOSIS — O24912 Unspecified diabetes mellitus in pregnancy, second trimester: Secondary | ICD-10-CM | POA: Insufficient documentation

## 2020-04-09 DIAGNOSIS — O34219 Maternal care for unspecified type scar from previous cesarean delivery: Secondary | ICD-10-CM

## 2020-04-09 DIAGNOSIS — Z3A27 27 weeks gestation of pregnancy: Secondary | ICD-10-CM

## 2020-04-15 ENCOUNTER — Telehealth: Payer: Self-pay | Admitting: *Deleted

## 2020-04-15 NOTE — Telephone Encounter (Signed)
Call received from [employer] representative who stated that pt triggered an elevated BP on 12/31 @ 3:32 am. She was not able to provide the elevated BP value. I called pt and left VM stating that I am following up on her BP from 12/31 and want to know how she was feeling @ the time as well as how she is feeling since then. I stated that I would like her to either send a Mychart message with this information or she may leave a detailed message on the nurse voicemail.

## 2020-04-17 ENCOUNTER — Encounter: Payer: Self-pay | Admitting: Obstetrics & Gynecology

## 2020-04-22 NOTE — Telephone Encounter (Signed)
Called patient and was not able to reach her. LM for her to call the office if she is having any questions or concerns. Advised her to please check her blood pressure and put in My Chart.

## 2020-04-24 ENCOUNTER — Encounter: Payer: Self-pay | Admitting: Obstetrics and Gynecology

## 2020-04-24 ENCOUNTER — Other Ambulatory Visit: Payer: Self-pay

## 2020-04-24 ENCOUNTER — Ambulatory Visit (INDEPENDENT_AMBULATORY_CARE_PROVIDER_SITE_OTHER): Payer: Self-pay | Admitting: Obstetrics and Gynecology

## 2020-04-24 VITALS — BP 136/76 | HR 89

## 2020-04-24 DIAGNOSIS — Z91199 Patient's noncompliance with other medical treatment and regimen due to unspecified reason: Secondary | ICD-10-CM | POA: Insufficient documentation

## 2020-04-24 DIAGNOSIS — Z6841 Body Mass Index (BMI) 40.0 and over, adult: Secondary | ICD-10-CM

## 2020-04-24 DIAGNOSIS — O09899 Supervision of other high risk pregnancies, unspecified trimester: Secondary | ICD-10-CM

## 2020-04-24 DIAGNOSIS — O24319 Unspecified pre-existing diabetes mellitus in pregnancy, unspecified trimester: Secondary | ICD-10-CM

## 2020-04-24 DIAGNOSIS — Z98891 History of uterine scar from previous surgery: Secondary | ICD-10-CM

## 2020-04-24 DIAGNOSIS — Z23 Encounter for immunization: Secondary | ICD-10-CM

## 2020-04-24 DIAGNOSIS — O24119 Pre-existing diabetes mellitus, type 2, in pregnancy, unspecified trimester: Secondary | ICD-10-CM

## 2020-04-24 DIAGNOSIS — Z148 Genetic carrier of other disease: Secondary | ICD-10-CM

## 2020-04-24 DIAGNOSIS — O0990 Supervision of high risk pregnancy, unspecified, unspecified trimester: Secondary | ICD-10-CM

## 2020-04-24 DIAGNOSIS — Z9119 Patient's noncompliance with other medical treatment and regimen: Secondary | ICD-10-CM

## 2020-04-24 DIAGNOSIS — Z3A29 29 weeks gestation of pregnancy: Secondary | ICD-10-CM

## 2020-04-24 DIAGNOSIS — O99210 Obesity complicating pregnancy, unspecified trimester: Secondary | ICD-10-CM

## 2020-04-24 DIAGNOSIS — O10919 Unspecified pre-existing hypertension complicating pregnancy, unspecified trimester: Secondary | ICD-10-CM

## 2020-04-24 NOTE — Progress Notes (Signed)
Prenatal Visit Note Date: 04/24/2020 Clinic: [facility]  Subjective:  Andrea [name] is a 30 y.o. [id number] at [redacted]w[redacted]d being seen today for ongoing prenatal care.  She is currently monitored for the following issues for this high-risk pregnancy and has History of cesarean section; BMI 40.0-44.9, adult (HCC); Healthcare maintenance; History of preterm delivery, currently pregnant; Obesity in pregnancy; Supervision of high risk pregnancy, antepartum; Preexisting diabetes complicating pregnancy, antepartum; Chronic hypertension during pregnancy, antepartum; and HBB carrier on their problem list.  Patient reports no complaints.   Contractions: Not present. Vag. Bleeding: None.  Movement: Present. Denies leaking of fluid.   The following portions of the patient's history were reviewed and updated as appropriate: allergies, current medications, past family history, past medical history, past social history, past surgical history and problem list. Problem list updated.  Objective:   Vitals:   04/24/20 0945  BP: 136/76  Pulse: 89    Fetal Status: Fetal Heart Rate (bpm): 152   Movement: Present     General:  Alert, oriented and cooperative. Patient is in no acute distress.  Skin: Skin is warm and dry. No rash noted.   Cardiovascular: Normal heart rate noted  Respiratory: Normal respiratory effort, no problems with respiration noted  Abdomen: Soft, gravid, appropriate for gestational age. Pain/Pressure: Absent     Pelvic:  Cervical exam deferred        Extremities: Normal range of motion.  Edema: None  Mental Status: Normal mood and affect. Normal behavior. Normal judgment and thought content.   Urinalysis:      Assessment and Plan:  Pregnancy: G3P1102 at [redacted]w[redacted]d  1. Genetic carrier HBB carrier. FOB prior tested.   2. Chronic hypertension during pregnancy, antepartum I looked through her chart and she had elevated BPs outside of pregnancy in 2018. Given her age,  pre-existing dm2 and obesity, she has cHTN.   Patient on no meds. 12/28 growth 37%, ac 76% with slight oligo at MVP 8.8. Start qwk testing at 32wks  Continue low dose ASA  3. Pre-existing type 2 diabetes mellitus during pregnancy, antepartum Patient states she is taking lantus 55-58 units qhs. She is not checking her CBGs and hasn't been checking them since mid December when she got a sample of a continuous CBG meter. She states the insurance paperwork was recently sent off and she anticipates it being approved soon. I told her that I recommend checking her sugars 3-4x/day until she can get the meter; she is reluctant to do this again. importance of this d/w her and risk of IUFD. Will bring back and see on Monday but I told her that if she still isnt' checking her sugars at that time I would recommend inpatient admission - Hemoglobin A1c  4. History of cesarean section Pt wondering about tolac. Can d/w her more nv.   5. History of preterm delivery, currently pregnant Not on 17p  6. Obesity in pregnancy  7. Preexisting diabetes complicating pregnancy, antepartum  8. Supervision of high risk pregnancy, antepartum I told her that since it's been more than six months I recommend covid booster.  - RPR - HIV Antibody (routine testing w rflx) - CBC - Hemoglobin A1c  9. BMI 40.0-44.9, adult (HCC)  Preterm labor symptoms and general obstetric precautions including but not limited to vaginal bleeding, contractions, leaking of fluid and fetal movement were reviewed in detail with the patient. Please refer to After Visit Summary for other counseling recommendations.  Return in about 5 days (around 04/29/2020)  for in person, md visit.   [place] Bing, MD

## 2020-04-24 NOTE — Patient Instructions (Signed)
Start checking your sugars four times a day with the diabetes lancets and meters. If you don't have any at home, I recommend buying the over the counter ones at [facility] ([facility]) or at [employer].   Your morning fasting should be 65-100 Your two hour after meal number should be less than 120-130

## 2020-04-25 ENCOUNTER — Telehealth: Payer: Self-pay

## 2020-04-25 LAB — CBC
Hematocrit: 33.8 % — ABNORMAL LOW (ref 34.0–46.6)
Hemoglobin: 11.2 g/dL (ref 11.1–15.9)
MCH: 27.2 pg (ref 26.6–33.0)
MCHC: 33.1 g/dL (ref 31.5–35.7)
MCV: 82 fL (ref 79–97)
Platelets: 354 10*3/uL (ref 150–450)
RBC: 4.12 x10E6/uL (ref 3.77–5.28)
RDW: 13.7 % (ref 11.7–15.4)
WBC: 7.9 10*3/uL (ref 3.4–10.8)

## 2020-04-25 LAB — HEMOGLOBIN A1C
Est. average glucose Bld gHb Est-mCnc: 146 mg/dL
Hgb A1c MFr Bld: 6.7 % — ABNORMAL HIGH (ref 4.8–5.6)

## 2020-04-25 LAB — HIV ANTIBODY (ROUTINE TESTING W REFLEX): HIV Screen 4th Generation wRfx: NONREACTIVE

## 2020-04-25 LAB — RPR: RPR Ser Ql: NONREACTIVE

## 2020-04-25 NOTE — Telephone Encounter (Signed)
Call received from [employer] with elevated BP reading of 145/97 from 04/24/20 at 8:30PM; pt reported no symptoms at that time. Called pt; pt requested we call back later as she is at work, told pt I will send a MyChart message. Message sent.

## 2020-04-29 ENCOUNTER — Encounter: Payer: Self-pay | Admitting: Obstetrics & Gynecology

## 2020-04-30 MED FILL — LANTUS SOLOSTAR 100 UNITS/M: 100 | 25 days supply | Qty: 15 | Fill #3

## 2020-05-01 ENCOUNTER — Encounter: Payer: Self-pay | Admitting: Family Medicine

## 2020-05-06 ENCOUNTER — Telehealth (INDEPENDENT_AMBULATORY_CARE_PROVIDER_SITE_OTHER): Payer: Self-pay | Admitting: Obstetrics & Gynecology

## 2020-05-06 ENCOUNTER — Encounter: Payer: Self-pay | Admitting: Obstetrics & Gynecology

## 2020-05-06 VITALS — BP 138/84 | HR 107

## 2020-05-06 DIAGNOSIS — E669 Obesity, unspecified: Secondary | ICD-10-CM

## 2020-05-06 DIAGNOSIS — O09213 Supervision of pregnancy with history of pre-term labor, third trimester: Secondary | ICD-10-CM

## 2020-05-06 DIAGNOSIS — Z98891 History of uterine scar from previous surgery: Secondary | ICD-10-CM

## 2020-05-06 DIAGNOSIS — O24119 Pre-existing diabetes mellitus, type 2, in pregnancy, unspecified trimester: Secondary | ICD-10-CM

## 2020-05-06 DIAGNOSIS — Z3A31 31 weeks gestation of pregnancy: Secondary | ICD-10-CM

## 2020-05-06 DIAGNOSIS — E119 Type 2 diabetes mellitus without complications: Secondary | ICD-10-CM

## 2020-05-06 DIAGNOSIS — O09899 Supervision of other high risk pregnancies, unspecified trimester: Secondary | ICD-10-CM

## 2020-05-06 DIAGNOSIS — O99210 Obesity complicating pregnancy, unspecified trimester: Secondary | ICD-10-CM

## 2020-05-06 DIAGNOSIS — O24113 Pre-existing diabetes mellitus, type 2, in pregnancy, third trimester: Secondary | ICD-10-CM

## 2020-05-06 DIAGNOSIS — O10913 Unspecified pre-existing hypertension complicating pregnancy, third trimester: Secondary | ICD-10-CM

## 2020-05-06 DIAGNOSIS — O99213 Obesity complicating pregnancy, third trimester: Secondary | ICD-10-CM

## 2020-05-06 DIAGNOSIS — O34219 Maternal care for unspecified type scar from previous cesarean delivery: Secondary | ICD-10-CM

## 2020-05-06 DIAGNOSIS — O10919 Unspecified pre-existing hypertension complicating pregnancy, unspecified trimester: Secondary | ICD-10-CM

## 2020-05-06 NOTE — Progress Notes (Signed)
I connected with  Andrea [name] on 05/06/20 at  2:35 PM EST by telephone and verified that I am speaking with the correct person using two identifiers.   I discussed the limitations, risks, security and privacy concerns of performing an evaluation and management service by telephone and the availability of in person appointments. I also discussed with the patient that there may be a patient responsible charge related to this service. The patient expressed understanding and agreed to proceed.  [name], RN 05/06/2020  2:59 PM

## 2020-05-06 NOTE — Progress Notes (Signed)
I connected with Andrea [name] 05/06/20 at  2:35 PM EST by: MyChart video and verified that I am speaking with the correct person using two identifiers.  Patient is located at home and provider is located at [facility].     The purpose of this virtual visit is to provide medical care while limiting exposure to the novel coronavirus. I discussed the limitations, risks, security and privacy concerns of performing an evaluation and management service by MyChart video and the availability of in person appointments. I also discussed with the patient that there may be a patient responsible charge related to this service. By engaging in this virtual visit, you consent to the provision of healthcare.  Additionally, you authorize for your insurance to be billed for the services provided during this visit.  The patient expressed understanding and agreed to proceed.  The following staff members participated in the virtual visit:  Gershon Crane, RN.    PRENATAL VISIT NOTE  Subjective:  Andrea [name] is a 30 y.o. P3A2505 at [redacted]w[redacted]d  for phone visit for ongoing prenatal care.  She is currently monitored for the following issues for this high-risk pregnancy and has History of cesarean section; BMI 40.0-44.9, adult (HCC); Healthcare maintenance; History of preterm delivery, currently pregnant; Obesity in pregnancy; Supervision of high risk pregnancy, antepartum; Preexisting diabetes complicating pregnancy, antepartum; Chronic hypertension during pregnancy, antepartum; HBB carrier; and Non-compliance on their problem list.  Patient reports no complaints.  Contractions: Not present. Vag. Bleeding: None.  Movement: Present. Denies leaking of fluid.   Reports her fasting BSs are typically in the 70's and 80's but her post prandials are 100's to 150s.  She is not writing these down.  She cannot tell me after what meals these occur and if she is or is not eating foods that are not recommended.  Taking 58 units of  Lantus.    The following portions of the patient's history were reviewed and updated as appropriate: allergies, current medications, past family history, past medical history, past social history, past surgical history and problem list.   Objective:   Vitals:   05/06/20 1501  BP: 138/84  Pulse: (!) 107   Self-Obtained  Fetal Status:     Movement: Present     Assessment and Plan:  Pregnancy: G3P1102 at [redacted]w[redacted]d 1. [redacted] weeks gestation of pregnancy - on PNV and baby ASA  2. Chronic hypertension during pregnancy, antepartum - BPs are stable  3. History of cesarean section - risks of trial of labor.  Pt considering VBAC.  Will need to sign consent.  4. History of preterm delivery, currently pregnant  5. Pre-existing type 2 diabetes mellitus during pregnancy, antepartum - Growth scan scheduled tomorrow.   - Weekly BPPs staring next week.   - Pt needs insulin with meals but is not keeping enough records to know when.  Pt is going to write down postprandials for next 6 days and return for OV on 05/14/2020 when comes for ultrasound - plan follow up with diabetes educations  6. Obesity in pregnancy  Preterm labor symptoms and general obstetric precautions including but not limited to vaginal bleeding, contractions, leaking of fluid and fetal movement were reviewed in detail with the patient.   Future Appointments  Date Time Provider Department Center  05/07/2020  9:00 AM [facility] NURSE [facility]  05/07/2020  9:15 AM [facility]2 [facility]  05/14/2020  9:30 AM [facility]SE [facility]  05/14/2020  9:45 AM [facility] US5 [facility]  05/21/2020  9:45 AM [facility]SE [facility]  05/21/2020 10:00 AM [facility]1 [facility]     Time spent on virtual visit: 23 minutes  Andrea Bears, MD

## 2020-05-06 NOTE — Progress Notes (Signed)
Called pt at 1445; VM left stating I am calling to check pt in for virtual appt and will call back in 10 minutes. Callback number given.

## 2020-05-07 ENCOUNTER — Other Ambulatory Visit: Payer: Self-pay

## 2020-05-07 ENCOUNTER — Other Ambulatory Visit: Payer: Self-pay | Admitting: *Deleted

## 2020-05-07 ENCOUNTER — Ambulatory Visit: Payer: Self-pay | Attending: Obstetrics and Gynecology

## 2020-05-07 ENCOUNTER — Ambulatory Visit: Payer: Self-pay | Admitting: *Deleted

## 2020-05-07 ENCOUNTER — Encounter: Payer: Self-pay | Admitting: *Deleted

## 2020-05-07 DIAGNOSIS — Z862 Personal history of diseases of the blood and blood-forming organs and certain disorders involving the immune mechanism: Secondary | ICD-10-CM

## 2020-05-07 DIAGNOSIS — O09213 Supervision of pregnancy with history of pre-term labor, third trimester: Secondary | ICD-10-CM

## 2020-05-07 DIAGNOSIS — Z3A31 31 weeks gestation of pregnancy: Secondary | ICD-10-CM

## 2020-05-07 DIAGNOSIS — O34219 Maternal care for unspecified type scar from previous cesarean delivery: Secondary | ICD-10-CM

## 2020-05-07 DIAGNOSIS — Z362 Encounter for other antenatal screening follow-up: Secondary | ICD-10-CM

## 2020-05-07 DIAGNOSIS — O132 Gestational [pregnancy-induced] hypertension without significant proteinuria, second trimester: Secondary | ICD-10-CM | POA: Insufficient documentation

## 2020-05-07 DIAGNOSIS — O133 Gestational [pregnancy-induced] hypertension without significant proteinuria, third trimester: Secondary | ICD-10-CM

## 2020-05-07 DIAGNOSIS — O10919 Unspecified pre-existing hypertension complicating pregnancy, unspecified trimester: Secondary | ICD-10-CM | POA: Insufficient documentation

## 2020-05-07 DIAGNOSIS — Z794 Long term (current) use of insulin: Secondary | ICD-10-CM

## 2020-05-07 DIAGNOSIS — E669 Obesity, unspecified: Secondary | ICD-10-CM

## 2020-05-07 DIAGNOSIS — O99213 Obesity complicating pregnancy, third trimester: Secondary | ICD-10-CM

## 2020-05-07 DIAGNOSIS — O24113 Pre-existing diabetes mellitus, type 2, in pregnancy, third trimester: Secondary | ICD-10-CM

## 2020-05-14 ENCOUNTER — Ambulatory Visit: Payer: Self-pay

## 2020-05-15 ENCOUNTER — Ambulatory Visit: Payer: Self-pay | Attending: Obstetrics and Gynecology

## 2020-05-15 ENCOUNTER — Other Ambulatory Visit: Payer: Self-pay

## 2020-05-15 ENCOUNTER — Ambulatory Visit: Payer: Self-pay | Admitting: *Deleted

## 2020-05-15 DIAGNOSIS — O34219 Maternal care for unspecified type scar from previous cesarean delivery: Secondary | ICD-10-CM

## 2020-05-15 DIAGNOSIS — O99893 Other specified diseases and conditions complicating puerperium: Secondary | ICD-10-CM

## 2020-05-15 DIAGNOSIS — O132 Gestational [pregnancy-induced] hypertension without significant proteinuria, second trimester: Secondary | ICD-10-CM | POA: Insufficient documentation

## 2020-05-15 DIAGNOSIS — E119 Type 2 diabetes mellitus without complications: Secondary | ICD-10-CM

## 2020-05-15 DIAGNOSIS — O24313 Unspecified pre-existing diabetes mellitus in pregnancy, third trimester: Secondary | ICD-10-CM

## 2020-05-15 DIAGNOSIS — O133 Gestational [pregnancy-induced] hypertension without significant proteinuria, third trimester: Secondary | ICD-10-CM

## 2020-05-15 DIAGNOSIS — Z3A32 32 weeks gestation of pregnancy: Secondary | ICD-10-CM

## 2020-05-15 DIAGNOSIS — O10919 Unspecified pre-existing hypertension complicating pregnancy, unspecified trimester: Secondary | ICD-10-CM | POA: Insufficient documentation

## 2020-05-15 DIAGNOSIS — O99213 Obesity complicating pregnancy, third trimester: Secondary | ICD-10-CM

## 2020-05-15 DIAGNOSIS — Z862 Personal history of diseases of the blood and blood-forming organs and certain disorders involving the immune mechanism: Secondary | ICD-10-CM

## 2020-05-15 DIAGNOSIS — E669 Obesity, unspecified: Secondary | ICD-10-CM

## 2020-05-20 ENCOUNTER — Telehealth: Payer: Self-pay

## 2020-05-20 NOTE — Telephone Encounter (Signed)
Created GFE's for patients upcoming appts and they were made available via MyChart.

## 2020-05-21 ENCOUNTER — Other Ambulatory Visit: Payer: Self-pay

## 2020-05-21 ENCOUNTER — Ambulatory Visit: Payer: Self-pay | Attending: Obstetrics and Gynecology

## 2020-05-21 ENCOUNTER — Ambulatory Visit: Payer: Self-pay | Admitting: *Deleted

## 2020-05-21 ENCOUNTER — Ambulatory Visit (INDEPENDENT_AMBULATORY_CARE_PROVIDER_SITE_OTHER): Payer: Self-pay | Admitting: Family Medicine

## 2020-05-21 ENCOUNTER — Encounter: Payer: Self-pay | Admitting: *Deleted

## 2020-05-21 ENCOUNTER — Other Ambulatory Visit: Payer: Self-pay | Admitting: Obstetrics and Gynecology

## 2020-05-21 ENCOUNTER — Encounter: Payer: Self-pay | Admitting: Family Medicine

## 2020-05-21 VITALS — BP 131/72 | HR 98 | Wt 268.8 lb

## 2020-05-21 DIAGNOSIS — O0990 Supervision of high risk pregnancy, unspecified, unspecified trimester: Secondary | ICD-10-CM

## 2020-05-21 DIAGNOSIS — O10919 Unspecified pre-existing hypertension complicating pregnancy, unspecified trimester: Secondary | ICD-10-CM | POA: Insufficient documentation

## 2020-05-21 DIAGNOSIS — O34219 Maternal care for unspecified type scar from previous cesarean delivery: Secondary | ICD-10-CM

## 2020-05-21 DIAGNOSIS — O24319 Unspecified pre-existing diabetes mellitus in pregnancy, unspecified trimester: Secondary | ICD-10-CM

## 2020-05-21 DIAGNOSIS — Z362 Encounter for other antenatal screening follow-up: Secondary | ICD-10-CM

## 2020-05-21 DIAGNOSIS — O10013 Pre-existing essential hypertension complicating pregnancy, third trimester: Secondary | ICD-10-CM

## 2020-05-21 DIAGNOSIS — O132 Gestational [pregnancy-induced] hypertension without significant proteinuria, second trimester: Secondary | ICD-10-CM

## 2020-05-21 DIAGNOSIS — Z862 Personal history of diseases of the blood and blood-forming organs and certain disorders involving the immune mechanism: Secondary | ICD-10-CM

## 2020-05-21 DIAGNOSIS — O99213 Obesity complicating pregnancy, third trimester: Secondary | ICD-10-CM

## 2020-05-21 DIAGNOSIS — O09899 Supervision of other high risk pregnancies, unspecified trimester: Secondary | ICD-10-CM

## 2020-05-21 DIAGNOSIS — Z98891 History of uterine scar from previous surgery: Secondary | ICD-10-CM

## 2020-05-21 DIAGNOSIS — Z3A33 33 weeks gestation of pregnancy: Secondary | ICD-10-CM

## 2020-05-21 DIAGNOSIS — O09213 Supervision of pregnancy with history of pre-term labor, third trimester: Secondary | ICD-10-CM

## 2020-05-21 DIAGNOSIS — O99210 Obesity complicating pregnancy, unspecified trimester: Secondary | ICD-10-CM

## 2020-05-21 DIAGNOSIS — O24313 Unspecified pre-existing diabetes mellitus in pregnancy, third trimester: Secondary | ICD-10-CM

## 2020-05-21 LAB — POCT URINALYSIS DIP (DEVICE)
Bilirubin Urine: NEGATIVE
Glucose, UA: NEGATIVE mg/dL
Hgb urine dipstick: NEGATIVE
Ketones, ur: NEGATIVE mg/dL
Nitrite: NEGATIVE
Protein, ur: NEGATIVE mg/dL
Specific Gravity, Urine: 1.025 (ref 1.005–1.030)
Urobilinogen, UA: 0.2 mg/dL (ref 0.0–1.0)
pH: 6 (ref 5.0–8.0)

## 2020-05-21 NOTE — Progress Notes (Signed)
   Subjective:  Andrea [name] is a 30 y.o. [id number] at [redacted]w[redacted]d being seen today for ongoing prenatal care.  She is currently monitored for the following issues for this high-risk pregnancy and has History of cesarean section; BMI 40.0-44.9, adult (HCC); Healthcare maintenance; History of preterm delivery, currently pregnant; Obesity in pregnancy; Supervision of high risk pregnancy, antepartum; Preexisting diabetes complicating pregnancy, antepartum; Chronic hypertension during pregnancy, antepartum; HBB carrier; and Non-compliance on their problem list.  Patient reports no complaints.  Contractions: Not present. Vag. Bleeding: None.  Movement: Present. Denies leaking of fluid.   The following portions of the patient's history were reviewed and updated as appropriate: allergies, current medications, past family history, past medical history, past social history, past surgical history and problem list. Problem list updated.  Objective:   Vitals:   05/21/20 1330  BP: 131/72  Pulse: 98  Weight: 268 lb 12.8 oz (121.9 kg)    Fetal Status: Fetal Heart Rate (bpm): 148   Movement: Present     General:  Alert, oriented and cooperative. Patient is in no acute distress.  Skin: Skin is warm and dry. No rash noted.   Cardiovascular: Normal heart rate noted  Respiratory: Normal respiratory effort, no problems with respiration noted  Abdomen: Soft, gravid, appropriate for gestational age. Pain/Pressure: Absent     Pelvic: Vag. Bleeding: None     Cervical exam deferred        Extremities: Normal range of motion.  Edema: None  Mental Status: Normal mood and affect. Normal behavior. Normal judgment and thought content.   Urinalysis:      Assessment and Plan:  Pregnancy: G3P1102 at [redacted]w[redacted]d  1. Chronic hypertension during pregnancy, antepartum BP well controlled off meds today Following for weekly testing w MFM  2. Supervision of high risk pregnancy, antepartum Clarified with patient that she  desires a repeat C-section, message sent to schedule BPP 8/10 today, in weekly testing BP and FHR normal  3. Obesity in pregnancy   4. History of preterm delivery, currently pregnant Not on progesteron  5. History of cesarean section X2, plan for repeat  6. Preexisting diabetes complicating pregnancy, antepartum Brings sugar log, 86% at goal with all out of range sugars PP in the evening Cont current regimen Discussed changing evening diet to target out of range sugars If maintains good control will deliver at 39 weeks, sooner if sugars are uncontrolled Following w MFM for weekly testing  Preterm labor symptoms and general obstetric precautions including but not limited to vaginal bleeding, contractions, leaking of fluid and fetal movement were reviewed in detail with the patient. Please refer to After Visit Summary for other counseling recommendations.  Return in 2 weeks (on 06/04/2020) for [facility], ob visit, needs MD.   Venora Maples, MD

## 2020-05-21 NOTE — Procedures (Signed)
Andrea [name] 08-27-1990 [redacted]w[redacted]d  Fetus A Non-Stress Test Interpretation for 05/21/20  Indication: Chronic Hypertenstion and Unsatisfactory BPP  Fetal Heart Rate A Mode: External Baseline Rate (A): 135 bpm Variability: Moderate Accelerations: 15 x 15 Decelerations: None Multiple birth?: No  Uterine Activity Mode: Palpation,Toco Contraction Frequency (min): Occas Contraction Quality: Mild Resting Tone Palpated: Relaxed Resting Time: Adequate  Interpretation (Fetal Testing) Nonstress Test Interpretation: Reactive Comments: Dr. Judeth Cornfield reviewed tracing

## 2020-05-21 NOTE — Patient Instructions (Signed)
 Contraception Choices Contraception, also called birth control, refers to methods or devices that prevent pregnancy. Hormonal methods Contraceptive implant A contraceptive implant is a thin, plastic tube that contains a hormone that prevents pregnancy. It is different from an intrauterine device (IUD). It is inserted into the upper part of the arm by a health care provider. Implants can be effective for up to 3 years. Progestin-only injections Progestin-only injections are injections of progestin, a synthetic form of the hormone progesterone. They are given every 3 months by a health care provider. Birth control pills Birth control pills are pills that contain hormones that prevent pregnancy. They must be taken once a day, preferably at the same time each day. A prescription is needed to use this method of contraception. Birth control patch The birth control patch contains hormones that prevent pregnancy. It is placed on the skin and must be changed once a week for three weeks and removed on the fourth week. A prescription is needed to use this method of contraception. Vaginal ring A vaginal ring contains hormones that prevent pregnancy. It is placed in the vagina for three weeks and removed on the fourth week. After that, the process is repeated with a new ring. A prescription is needed to use this method of contraception. Emergency contraceptive Emergency contraceptives prevent pregnancy after unprotected sex. They come in pill form and can be taken up to 5 days after sex. They work best the sooner they are taken after having sex. Most emergency contraceptives are available without a prescription. This method should not be used as your only form of birth control.   Barrier methods Female condom A female condom is a thin sheath that is worn over the penis during sex. Condoms keep sperm from going inside a woman's body. They can be used with a sperm-killing substance (spermicide) to increase their  effectiveness. They should be thrown away after one use. Female condom A female condom is a soft, loose-fitting sheath that is put into the vagina before sex. The condom keeps sperm from going inside a woman's body. They should be thrown away after one use. Diaphragm A diaphragm is a soft, dome-shaped barrier. It is inserted into the vagina before sex, along with a spermicide. The diaphragm blocks sperm from entering the uterus, and the spermicide kills sperm. A diaphragm should be left in the vagina for 6-8 hours after sex and removed within 24 hours. A diaphragm is prescribed and fitted by a health care provider. A diaphragm should be replaced every 1-2 years, after giving birth, after gaining more than 15 lb (6.8 kg), and after pelvic surgery. Cervical cap A cervical cap is a round, soft latex or plastic cup that fits over the cervix. It is inserted into the vagina before sex, along with spermicide. It blocks sperm from entering the uterus. The cap should be left in place for 6-8 hours after sex and removed within 48 hours. A cervical cap must be prescribed and fitted by a health care provider. It should be replaced every 2 years. Sponge A sponge is a soft, circular piece of polyurethane foam with spermicide in it. The sponge helps block sperm from entering the uterus, and the spermicide kills sperm. To use it, you make it wet and then insert it into the vagina. It should be inserted before sex, left in for at least 6 hours after sex, and removed and thrown away within 30 hours. Spermicides Spermicides are chemicals that kill or block sperm from entering the   cervix and uterus. They can come as a cream, jelly, suppository, foam, or tablet. A spermicide should be inserted into the vagina with an applicator at least 10-15 minutes before sex to allow time for it to work. The process must be repeated every time you have sex. Spermicides do not require a prescription.   Intrauterine  contraception Intrauterine device (IUD) An IUD is a T-shaped device that is put in a woman's uterus. There are two types:  Hormone IUD.This type contains progestin, a synthetic form of the hormone progesterone. This type can stay in place for 3-5 years.  Copper IUD.This type is wrapped in copper wire. It can stay in place for 10 years. Permanent methods of contraception Female tubal ligation In this method, a woman's fallopian tubes are sealed, tied, or blocked during surgery to prevent eggs from traveling to the uterus. Hysteroscopic sterilization In this method, a small, flexible insert is placed into each fallopian tube. The inserts cause scar tissue to form in the fallopian tubes and block them, so sperm cannot reach an egg. The procedure takes about 3 months to be effective. Another form of birth control must be used during those 3 months. Female sterilization This is a procedure to tie off the tubes that carry sperm (vasectomy). After the procedure, the man can still ejaculate fluid (semen). Another form of birth control must be used for 3 months after the procedure. Natural planning methods Natural family planning In this method, a couple does not have sex on days when the woman could become pregnant. Calendar method In this method, the woman keeps track of the length of each menstrual cycle, identifies the days when pregnancy can happen, and does not have sex on those days. Ovulation method In this method, a couple avoids sex during ovulation. Symptothermal method This method involves not having sex during ovulation. The woman typically checks for ovulation by watching changes in her temperature and in the consistency of cervical mucus. Post-ovulation method In this method, a couple waits to have sex until after ovulation. Where to find more information  Centers for Disease Control and Prevention: www.cdc.gov Summary  Contraception, also called birth control, refers to methods or  devices that prevent pregnancy.  Hormonal methods of contraception include implants, injections, pills, patches, vaginal rings, and emergency contraceptives.  Barrier methods of contraception can include female condoms, female condoms, diaphragms, cervical caps, sponges, and spermicides.  There are two types of IUDs (intrauterine devices). An IUD can be put in a woman's uterus to prevent pregnancy for 3-5 years.  Permanent sterilization can be done through a procedure for males and females. Natural family planning methods involve nothaving sex on days when the woman could become pregnant. This information is not intended to replace advice given to you by your health care provider. Make sure you discuss any questions you have with your health care provider. Document Revised: 09/04/2019 Document Reviewed: 09/04/2019 Elsevier Patient Education  2021 [employer] Inc.   Breastfeeding  Choosing to breastfeed is one of the best decisions you can make for yourself and your baby. A change in hormones during pregnancy causes your breasts to make breast milk in your milk-producing glands. Hormones prevent breast milk from being released before your baby is born. They also prompt milk flow after birth. Once breastfeeding has begun, thoughts of your baby, as well as his or her sucking or crying, can stimulate the release of milk from your milk-producing glands. Benefits of breastfeeding Research shows that breastfeeding offers many health benefits   for infants and mothers. It also offers a cost-free and convenient way to feed your baby. For your baby  Your first milk (colostrum) helps your baby's digestive system to function better.  Special cells in your milk (antibodies) help your baby to fight off infections.  Breastfed babies are less likely to develop asthma, allergies, obesity, or type 2 diabetes. They are also at lower risk for sudden infant death syndrome (SIDS).  Nutrients in breast milk are better  able to meet your baby's needs compared to infant formula.  Breast milk improves your baby's brain development. For you  Breastfeeding helps to create a very special bond between you and your baby.  Breastfeeding is convenient. Breast milk costs nothing and is always available at the correct temperature.  Breastfeeding helps to burn calories. It helps you to lose the weight that you gained during pregnancy.  Breastfeeding makes your uterus return faster to its size before pregnancy. It also slows bleeding (lochia) after you give birth.  Breastfeeding helps to lower your risk of developing type 2 diabetes, osteoporosis, rheumatoid arthritis, cardiovascular disease, and breast, ovarian, uterine, and endometrial cancer later in life. Breastfeeding basics Starting breastfeeding  Find a comfortable place to sit or lie down, with your neck and back well-supported.  Place a pillow or a rolled-up blanket under your baby to bring him or her to the level of your breast (if you are seated). Nursing pillows are specially designed to help support your arms and your baby while you breastfeed.  Make sure that your baby's tummy (abdomen) is facing your abdomen.  Gently massage your breast. With your fingertips, massage from the outer edges of your breast inward toward the nipple. This encourages milk flow. If your milk flows slowly, you may need to continue this action during the feeding.  Support your breast with 4 fingers underneath and your thumb above your nipple (make the letter "C" with your hand). Make sure your fingers are well away from your nipple and your baby's mouth.  Stroke your baby's lips gently with your finger or nipple.  When your baby's mouth is open wide enough, quickly bring your baby to your breast, placing your entire nipple and as much of the areola as possible into your baby's mouth. The areola is the colored area around your nipple. ? More areola should be visible above your  baby's upper lip than below the lower lip. ? Your baby's lips should be opened and extended outward (flanged) to ensure an adequate, comfortable latch. ? Your baby's tongue should be between his or her lower gum and your breast.  Make sure that your baby's mouth is correctly positioned around your nipple (latched). Your baby's lips should create a seal on your breast and be turned out (everted).  It is common for your baby to suck about 2-3 minutes in order to start the flow of breast milk. Latching Teaching your baby how to latch onto your breast properly is very important. An improper latch can cause nipple pain, decreased milk supply, and poor weight gain in your baby. Also, if your baby is not latched onto your nipple properly, he or she may swallow some air during feeding. This can make your baby fussy. Burping your baby when you switch breasts during the feeding can help to get rid of the air. However, teaching your baby to latch on properly is still the best way to prevent fussiness from swallowing air while breastfeeding. Signs that your baby has successfully latched onto   your nipple  Silent tugging or silent sucking, without causing you pain. Infant's lips should be extended outward (flanged).  Swallowing heard between every 3-4 sucks once your milk has started to flow (after your let-down milk reflex occurs).  Muscle movement above and in front of his or her ears while sucking. Signs that your baby has not successfully latched onto your nipple  Sucking sounds or smacking sounds from your baby while breastfeeding.  Nipple pain. If you think your baby has not latched on correctly, slip your finger into the corner of your baby's mouth to break the suction and place it between your baby's gums. Attempt to start breastfeeding again. Signs of successful breastfeeding Signs from your baby  Your baby will gradually decrease the number of sucks or will completely stop sucking.  Your baby  will fall asleep.  Your baby's body will relax.  Your baby will retain a small amount of milk in his or her mouth.  Your baby will let go of your breast by himself or herself. Signs from you  Breasts that have increased in firmness, weight, and size 1-3 hours after feeding.  Breasts that are softer immediately after breastfeeding.  Increased milk volume, as well as a change in milk consistency and color by the fifth day of breastfeeding.  Nipples that are not sore, cracked, or bleeding. Signs that your baby is getting enough milk  Wetting at least 1-2 diapers during the first 24 hours after birth.  Wetting at least 5-6 diapers every 24 hours for the first week after birth. The urine should be clear or pale yellow by the age of 5 days.  Wetting 6-8 diapers every 24 hours as your baby continues to grow and develop.  At least 3 stools in a 24-hour period by the age of 5 days. The stool should be soft and yellow.  At least 3 stools in a 24-hour period by the age of 7 days. The stool should be seedy and yellow.  No loss of weight greater than 10% of birth weight during the first 3 days of life.  Average weight gain of 4-7 oz (113-198 g) per week after the age of 4 days.  Consistent daily weight gain by the age of 5 days, without weight loss after the age of 2 weeks. After a feeding, your baby may spit up a small amount of milk. This is normal. Breastfeeding frequency and duration Frequent feeding will help you make more milk and can prevent sore nipples and extremely full breasts (breast engorgement). Breastfeed when you feel the need to reduce the fullness of your breasts or when your baby shows signs of hunger. This is called "breastfeeding on demand." Signs that your baby is hungry include:  Increased alertness, activity, or restlessness.  Movement of the head from side to side.  Opening of the mouth when the corner of the mouth or cheek is stroked (rooting).  Increased  sucking sounds, smacking lips, cooing, sighing, or squeaking.  Hand-to-mouth movements and sucking on fingers or hands.  Fussing or crying. Avoid introducing a pacifier to your baby in the first 4-6 weeks after your baby is born. After this time, you may choose to use a pacifier. Research has shown that pacifier use during the first year of a baby's life decreases the risk of sudden infant death syndrome (SIDS). Allow your baby to feed on each breast as long as he or she wants. When your baby unlatches or falls asleep while feeding from the   first breast, offer the second breast. Because newborns are often sleepy in the first few weeks of life, you may need to awaken your baby to get him or her to feed. Breastfeeding times will vary from baby to baby. However, the following rules can serve as a guide to help you make sure that your baby is properly fed:  Newborns (babies 4 weeks of age or younger) may breastfeed every 1-3 hours.  Newborns should not go without breastfeeding for longer than 3 hours during the day or 5 hours during the night.  You should breastfeed your baby a minimum of 8 times in a 24-hour period. Breast milk pumping Pumping and storing breast milk allows you to make sure that your baby is exclusively fed your breast milk, even at times when you are unable to breastfeed. This is especially important if you go back to work while you are still breastfeeding, or if you are not able to be present during feedings. Your lactation consultant can help you find a method of pumping that works best for you and give you guidelines about how long it is safe to store breast milk.      Caring for your breasts while you breastfeed Nipples can become dry, cracked, and sore while breastfeeding. The following recommendations can help keep your breasts moisturized and healthy:  Avoid using soap on your nipples.  Wear a supportive bra designed especially for nursing. Avoid wearing underwire-style  bras or extremely tight bras (sports bras).  Air-dry your nipples for 3-4 minutes after each feeding.  Use only cotton bra pads to absorb leaked breast milk. Leaking of breast milk between feedings is normal.  Use lanolin on your nipples after breastfeeding. Lanolin helps to maintain your skin's normal moisture barrier. Pure lanolin is not harmful (not toxic) to your baby. You may also hand express a few drops of breast milk and gently massage that milk into your nipples and allow the milk to air-dry. In the first few weeks after giving birth, some women experience breast engorgement. Engorgement can make your breasts feel heavy, warm, and tender to the touch. Engorgement peaks within 3-5 days after you give birth. The following recommendations can help to ease engorgement:  Completely empty your breasts while breastfeeding or pumping. You may want to start by applying warm, moist heat (in the shower or with warm, water-soaked hand towels) just before feeding or pumping. This increases circulation and helps the milk flow. If your baby does not completely empty your breasts while breastfeeding, pump any extra milk after he or she is finished.  Apply ice packs to your breasts immediately after breastfeeding or pumping, unless this is too uncomfortable for you. To do this: ? Put ice in a plastic bag. ? Place a towel between your skin and the bag. ? Leave the ice on for 20 minutes, 2-3 times a day.  Make sure that your baby is latched on and positioned properly while breastfeeding. If engorgement persists after 48 hours of following these recommendations, contact your health care provider or a lactation consultant. Overall health care recommendations while breastfeeding  Eat 3 healthy meals and 3 snacks every day. Well-nourished mothers who are breastfeeding need an additional 450-500 calories a day. You can meet this requirement by increasing the amount of a balanced diet that you eat.  Drink  enough water to keep your urine pale yellow or clear.  Rest often, relax, and continue to take your prenatal vitamins to prevent fatigue, stress, and low   vitamin and mineral levels in your body (nutrient deficiencies).  Do not use any products that contain nicotine or tobacco, such as cigarettes and e-cigarettes. Your baby may be harmed by chemicals from cigarettes that pass into breast milk and exposure to secondhand smoke. If you need help quitting, ask your health care provider.  Avoid alcohol.  Do not use illegal drugs or marijuana.  Talk with your health care provider before taking any medicines. These include over-the-counter and prescription medicines as well as vitamins and herbal supplements. Some medicines that may be harmful to your baby can pass through breast milk.  It is possible to become pregnant while breastfeeding. If birth control is desired, ask your health care provider about options that will be safe while breastfeeding your baby. Where to find more information: [employer]: www.llli.org Contact a health care provider if:  You feel like you want to stop breastfeeding or have become frustrated with breastfeeding.  Your nipples are cracked or bleeding.  Your breasts are red, tender, or warm.  You have: ? Painful breasts or nipples. ? A swollen area on either breast. ? A fever or chills. ? Nausea or vomiting. ? Drainage other than breast milk from your nipples.  Your breasts do not become full before feedings by the fifth day after you give birth.  You feel sad and depressed.  Your baby is: ? Too sleepy to eat well. ? Having trouble sleeping. ? More than 1 week old and wetting fewer than 6 diapers in a 24-hour period. ? Not gaining weight by 5 days of age.  Your baby has fewer than 3 stools in a 24-hour period.  Your baby's skin or the white parts of his or her eyes become yellow. Get help right away if:  Your baby is overly tired  (lethargic) and does not want to wake up and feed.  Your baby develops an unexplained fever. Summary  Breastfeeding offers many health benefits for infant and mothers.  Try to breastfeed your infant when he or she shows early signs of hunger.  Gently tickle or stroke your baby's lips with your finger or nipple to allow the baby to open his or her mouth. Bring the baby to your breast. Make sure that much of the areola is in your baby's mouth. Offer one side and burp the baby before you offer the other side.  Talk with your health care provider or lactation consultant if you have questions or you face problems as you breastfeed. This information is not intended to replace advice given to you by your health care provider. Make sure you discuss any questions you have with your health care provider. Document Revised: 06/24/2017 Document Reviewed: 05/01/2016 Elsevier Patient Education  2021 [employer] Inc.  

## 2020-05-22 MED FILL — LANTUS SOLOSTAR 100 UNITS/M: 100 | 25 days supply | Qty: 15 | Fill #4

## 2020-05-28 ENCOUNTER — Ambulatory Visit: Payer: BC Managed Care – PPO | Attending: Obstetrics

## 2020-05-28 ENCOUNTER — Encounter: Payer: Self-pay | Admitting: *Deleted

## 2020-05-28 ENCOUNTER — Other Ambulatory Visit: Payer: Self-pay | Admitting: *Deleted

## 2020-05-28 ENCOUNTER — Ambulatory Visit: Payer: BC Managed Care – PPO | Admitting: *Deleted

## 2020-05-28 ENCOUNTER — Other Ambulatory Visit: Payer: Self-pay

## 2020-05-28 DIAGNOSIS — O10919 Unspecified pre-existing hypertension complicating pregnancy, unspecified trimester: Secondary | ICD-10-CM

## 2020-05-28 DIAGNOSIS — O09213 Supervision of pregnancy with history of pre-term labor, third trimester: Secondary | ICD-10-CM | POA: Diagnosis not present

## 2020-05-28 DIAGNOSIS — O24313 Unspecified pre-existing diabetes mellitus in pregnancy, third trimester: Secondary | ICD-10-CM

## 2020-05-28 DIAGNOSIS — O99213 Obesity complicating pregnancy, third trimester: Secondary | ICD-10-CM | POA: Diagnosis not present

## 2020-05-28 DIAGNOSIS — O24113 Pre-existing diabetes mellitus, type 2, in pregnancy, third trimester: Secondary | ICD-10-CM

## 2020-05-28 DIAGNOSIS — O34219 Maternal care for unspecified type scar from previous cesarean delivery: Secondary | ICD-10-CM

## 2020-05-28 DIAGNOSIS — Z3A34 34 weeks gestation of pregnancy: Secondary | ICD-10-CM

## 2020-05-28 DIAGNOSIS — Z862 Personal history of diseases of the blood and blood-forming organs and certain disorders involving the immune mechanism: Secondary | ICD-10-CM

## 2020-05-28 DIAGNOSIS — O10013 Pre-existing essential hypertension complicating pregnancy, third trimester: Secondary | ICD-10-CM

## 2020-05-28 DIAGNOSIS — O99113 Other diseases of the blood and blood-forming organs and certain disorders involving the immune mechanism complicating pregnancy, third trimester: Secondary | ICD-10-CM

## 2020-06-03 ENCOUNTER — Other Ambulatory Visit: Payer: Self-pay | Admitting: Obstetrics & Gynecology

## 2020-06-04 ENCOUNTER — Other Ambulatory Visit: Payer: Self-pay

## 2020-06-04 ENCOUNTER — Ambulatory Visit: Payer: BC Managed Care – PPO | Attending: Obstetrics

## 2020-06-04 ENCOUNTER — Encounter: Payer: Self-pay | Admitting: *Deleted

## 2020-06-04 ENCOUNTER — Ambulatory Visit: Payer: BC Managed Care – PPO | Admitting: *Deleted

## 2020-06-04 ENCOUNTER — Ambulatory Visit (INDEPENDENT_AMBULATORY_CARE_PROVIDER_SITE_OTHER): Payer: BC Managed Care – PPO | Admitting: Family Medicine

## 2020-06-04 VITALS — BP 137/88 | HR 88 | Wt 275.2 lb

## 2020-06-04 DIAGNOSIS — Z98891 History of uterine scar from previous surgery: Secondary | ICD-10-CM

## 2020-06-04 DIAGNOSIS — O24319 Unspecified pre-existing diabetes mellitus in pregnancy, unspecified trimester: Secondary | ICD-10-CM

## 2020-06-04 DIAGNOSIS — O10919 Unspecified pre-existing hypertension complicating pregnancy, unspecified trimester: Secondary | ICD-10-CM

## 2020-06-04 DIAGNOSIS — O99213 Obesity complicating pregnancy, third trimester: Secondary | ICD-10-CM

## 2020-06-04 DIAGNOSIS — Z3A35 35 weeks gestation of pregnancy: Secondary | ICD-10-CM

## 2020-06-04 DIAGNOSIS — O24313 Unspecified pre-existing diabetes mellitus in pregnancy, third trimester: Secondary | ICD-10-CM

## 2020-06-04 DIAGNOSIS — O10013 Pre-existing essential hypertension complicating pregnancy, third trimester: Secondary | ICD-10-CM

## 2020-06-04 DIAGNOSIS — O0990 Supervision of high risk pregnancy, unspecified, unspecified trimester: Secondary | ICD-10-CM

## 2020-06-04 DIAGNOSIS — Z862 Personal history of diseases of the blood and blood-forming organs and certain disorders involving the immune mechanism: Secondary | ICD-10-CM

## 2020-06-04 DIAGNOSIS — O99210 Obesity complicating pregnancy, unspecified trimester: Secondary | ICD-10-CM

## 2020-06-04 DIAGNOSIS — O34219 Maternal care for unspecified type scar from previous cesarean delivery: Secondary | ICD-10-CM

## 2020-06-04 DIAGNOSIS — O09899 Supervision of other high risk pregnancies, unspecified trimester: Secondary | ICD-10-CM

## 2020-06-04 LAB — POCT URINALYSIS DIP (DEVICE)
Bilirubin Urine: NEGATIVE
Glucose, UA: NEGATIVE mg/dL
Hgb urine dipstick: NEGATIVE
Ketones, ur: NEGATIVE mg/dL
Leukocytes,Ua: NEGATIVE
Nitrite: NEGATIVE
Protein, ur: NEGATIVE mg/dL
Specific Gravity, Urine: 1.025 (ref 1.005–1.030)
Urobilinogen, UA: 0.2 mg/dL (ref 0.0–1.0)
pH: 6 (ref 5.0–8.0)

## 2020-06-04 NOTE — Progress Notes (Signed)
   Subjective:  Andrea [name] is a 30 y.o. [phone] at [redacted]w[redacted]d being seen today for ongoing prenatal care.  She is currently monitored for the following issues for this high-risk pregnancy and has History of cesarean section; BMI 40.0-44.9, adult (HCC); Healthcare maintenance; History of preterm delivery, currently pregnant; Obesity in pregnancy; Supervision of high risk pregnancy, antepartum; Preexisting diabetes complicating pregnancy, antepartum; Chronic hypertension during pregnancy, antepartum; HBB carrier; and Non-compliance on their problem list.  Patient reports no complaints.  Contractions: Not present. Vag. Bleeding: None.  Movement: Present. Denies leaking of fluid.   The following portions of the patient's history were reviewed and updated as appropriate: allergies, current medications, past family history, past medical history, past social history, past surgical history and problem list. Problem list updated.  Objective:   Vitals:   06/04/20 1121  BP: 137/88  Pulse: 88  Weight: 275 lb 3.2 oz (124.8 kg)    Fetal Status: Fetal Heart Rate (bpm): 152   Movement: Present     General:  Alert, oriented and cooperative. Patient is in no acute distress.  Skin: Skin is warm and dry. No rash noted.   Cardiovascular: Normal heart rate noted  Respiratory: Normal respiratory effort, no problems with respiration noted  Abdomen: Soft, gravid, appropriate for gestational age. Pain/Pressure: Absent     Pelvic: Vag. Bleeding: None     Cervical exam deferred        Extremities: Normal range of motion.  Edema: None  Mental Status: Normal mood and affect. Normal behavior. Normal judgment and thought content.   Urinalysis:      Assessment and Plan:  Pregnancy: G3P1102 at [redacted]w[redacted]d  1. Supervision of high risk pregnancy, antepartum BP and FHR normal Still considering BTL, will decide before delivery Swabs at next visit  2. Obesity in pregnancy   3. History of preterm delivery, currently  pregnant Not on progesterone  4. History of cesarean section X2, scheduled for repeat  5. Preexisting diabetes complicating pregnancy, antepartum Sugars largely at goal with exception of evenings which range 120-130's Also having low values in the 60's on occasion Given this I am hesitant to add any short acting insulin, recommended dietary changes Growth [place] today normal, BPP 8/8 Following w MFM for antenatal testing until delivery  6. Chronic hypertension during pregnancy, antepartum Stable on no meds  Preterm labor symptoms and general obstetric precautions including but not limited to vaginal bleeding, contractions, leaking of fluid and fetal movement were reviewed in detail with the patient. Please refer to After Visit Summary for other counseling recommendations.  Return in 1 week (on 06/11/2020).   Venora Maples, MD

## 2020-06-04 NOTE — Patient Instructions (Signed)
 Contraception Choices Contraception, also called birth control, refers to methods or devices that prevent pregnancy. Hormonal methods Contraceptive implant A contraceptive implant is a thin, plastic tube that contains a hormone that prevents pregnancy. It is different from an intrauterine device (IUD). It is inserted into the upper part of the arm by a health care provider. Implants can be effective for up to 3 years. Progestin-only injections Progestin-only injections are injections of progestin, a synthetic form of the hormone progesterone. They are given every 3 months by a health care provider. Birth control pills Birth control pills are pills that contain hormones that prevent pregnancy. They must be taken once a day, preferably at the same time each day. A prescription is needed to use this method of contraception. Birth control patch The birth control patch contains hormones that prevent pregnancy. It is placed on the skin and must be changed once a week for three weeks and removed on the fourth week. A prescription is needed to use this method of contraception. Vaginal ring A vaginal ring contains hormones that prevent pregnancy. It is placed in the vagina for three weeks and removed on the fourth week. After that, the process is repeated with a new ring. A prescription is needed to use this method of contraception. Emergency contraceptive Emergency contraceptives prevent pregnancy after unprotected sex. They come in pill form and can be taken up to 5 days after sex. They work best the sooner they are taken after having sex. Most emergency contraceptives are available without a prescription. This method should not be used as your only form of birth control.   Barrier methods Female condom A female condom is a thin sheath that is worn over the penis during sex. Condoms keep sperm from going inside a woman's body. They can be used with a sperm-killing substance (spermicide) to increase their  effectiveness. They should be thrown away after one use. Female condom A female condom is a soft, loose-fitting sheath that is put into the vagina before sex. The condom keeps sperm from going inside a woman's body. They should be thrown away after one use. Diaphragm A diaphragm is a soft, dome-shaped barrier. It is inserted into the vagina before sex, along with a spermicide. The diaphragm blocks sperm from entering the uterus, and the spermicide kills sperm. A diaphragm should be left in the vagina for 6-8 hours after sex and removed within 24 hours. A diaphragm is prescribed and fitted by a health care provider. A diaphragm should be replaced every 1-2 years, after giving birth, after gaining more than 15 lb (6.8 kg), and after pelvic surgery. Cervical cap A cervical cap is a round, soft latex or plastic cup that fits over the cervix. It is inserted into the vagina before sex, along with spermicide. It blocks sperm from entering the uterus. The cap should be left in place for 6-8 hours after sex and removed within 48 hours. A cervical cap must be prescribed and fitted by a health care provider. It should be replaced every 2 years. Sponge A sponge is a soft, circular piece of polyurethane foam with spermicide in it. The sponge helps block sperm from entering the uterus, and the spermicide kills sperm. To use it, you make it wet and then insert it into the vagina. It should be inserted before sex, left in for at least 6 hours after sex, and removed and thrown away within 30 hours. Spermicides Spermicides are chemicals that kill or block sperm from entering the   cervix and uterus. They can come as a cream, jelly, suppository, foam, or tablet. A spermicide should be inserted into the vagina with an applicator at least 10-15 minutes before sex to allow time for it to work. The process must be repeated every time you have sex. Spermicides do not require a prescription.   Intrauterine  contraception Intrauterine device (IUD) An IUD is a T-shaped device that is put in a woman's uterus. There are two types:  Hormone IUD.This type contains progestin, a synthetic form of the hormone progesterone. This type can stay in place for 3-5 years.  Copper IUD.This type is wrapped in copper wire. It can stay in place for 10 years. Permanent methods of contraception Female tubal ligation In this method, a woman's fallopian tubes are sealed, tied, or blocked during surgery to prevent eggs from traveling to the uterus. Hysteroscopic sterilization In this method, a small, flexible insert is placed into each fallopian tube. The inserts cause scar tissue to form in the fallopian tubes and block them, so sperm cannot reach an egg. The procedure takes about 3 months to be effective. Another form of birth control must be used during those 3 months. Female sterilization This is a procedure to tie off the tubes that carry sperm (vasectomy). After the procedure, the man can still ejaculate fluid (semen). Another form of birth control must be used for 3 months after the procedure. Natural planning methods Natural family planning In this method, a couple does not have sex on days when the woman could become pregnant. Calendar method In this method, the woman keeps track of the length of each menstrual cycle, identifies the days when pregnancy can happen, and does not have sex on those days. Ovulation method In this method, a couple avoids sex during ovulation. Symptothermal method This method involves not having sex during ovulation. The woman typically checks for ovulation by watching changes in her temperature and in the consistency of cervical mucus. Post-ovulation method In this method, a couple waits to have sex until after ovulation. Where to find more information  Centers for Disease Control and Prevention: www.cdc.gov Summary  Contraception, also called birth control, refers to methods or  devices that prevent pregnancy.  Hormonal methods of contraception include implants, injections, pills, patches, vaginal rings, and emergency contraceptives.  Barrier methods of contraception can include female condoms, female condoms, diaphragms, cervical caps, sponges, and spermicides.  There are two types of IUDs (intrauterine devices). An IUD can be put in a woman's uterus to prevent pregnancy for 3-5 years.  Permanent sterilization can be done through a procedure for males and females. Natural family planning methods involve nothaving sex on days when the woman could become pregnant. This information is not intended to replace advice given to you by your health care provider. Make sure you discuss any questions you have with your health care provider. Document Revised: 09/04/2019 Document Reviewed: 09/04/2019 Elsevier Patient Education  2021 [employer] Inc.   Breastfeeding  Choosing to breastfeed is one of the best decisions you can make for yourself and your baby. A change in hormones during pregnancy causes your breasts to make breast milk in your milk-producing glands. Hormones prevent breast milk from being released before your baby is born. They also prompt milk flow after birth. Once breastfeeding has begun, thoughts of your baby, as well as his or her sucking or crying, can stimulate the release of milk from your milk-producing glands. Benefits of breastfeeding Research shows that breastfeeding offers many health benefits   for infants and mothers. It also offers a cost-free and convenient way to feed your baby. For your baby  Your first milk (colostrum) helps your baby's digestive system to function better.  Special cells in your milk (antibodies) help your baby to fight off infections.  Breastfed babies are less likely to develop asthma, allergies, obesity, or type 2 diabetes. They are also at lower risk for sudden infant death syndrome (SIDS).  Nutrients in breast milk are better  able to meet your baby's needs compared to infant formula.  Breast milk improves your baby's brain development. For you  Breastfeeding helps to create a very special bond between you and your baby.  Breastfeeding is convenient. Breast milk costs nothing and is always available at the correct temperature.  Breastfeeding helps to burn calories. It helps you to lose the weight that you gained during pregnancy.  Breastfeeding makes your uterus return faster to its size before pregnancy. It also slows bleeding (lochia) after you give birth.  Breastfeeding helps to lower your risk of developing type 2 diabetes, osteoporosis, rheumatoid arthritis, cardiovascular disease, and breast, ovarian, uterine, and endometrial cancer later in life. Breastfeeding basics Starting breastfeeding  Find a comfortable place to sit or lie down, with your neck and back well-supported.  Place a pillow or a rolled-up blanket under your baby to bring him or her to the level of your breast (if you are seated). Nursing pillows are specially designed to help support your arms and your baby while you breastfeed.  Make sure that your baby's tummy (abdomen) is facing your abdomen.  Gently massage your breast. With your fingertips, massage from the outer edges of your breast inward toward the nipple. This encourages milk flow. If your milk flows slowly, you may need to continue this action during the feeding.  Support your breast with 4 fingers underneath and your thumb above your nipple (make the letter "C" with your hand). Make sure your fingers are well away from your nipple and your baby's mouth.  Stroke your baby's lips gently with your finger or nipple.  When your baby's mouth is open wide enough, quickly bring your baby to your breast, placing your entire nipple and as much of the areola as possible into your baby's mouth. The areola is the colored area around your nipple. ? More areola should be visible above your  baby's upper lip than below the lower lip. ? Your baby's lips should be opened and extended outward (flanged) to ensure an adequate, comfortable latch. ? Your baby's tongue should be between his or her lower gum and your breast.  Make sure that your baby's mouth is correctly positioned around your nipple (latched). Your baby's lips should create a seal on your breast and be turned out (everted).  It is common for your baby to suck about 2-3 minutes in order to start the flow of breast milk. Latching Teaching your baby how to latch onto your breast properly is very important. An improper latch can cause nipple pain, decreased milk supply, and poor weight gain in your baby. Also, if your baby is not latched onto your nipple properly, he or she may swallow some air during feeding. This can make your baby fussy. Burping your baby when you switch breasts during the feeding can help to get rid of the air. However, teaching your baby to latch on properly is still the best way to prevent fussiness from swallowing air while breastfeeding. Signs that your baby has successfully latched onto   your nipple  Silent tugging or silent sucking, without causing you pain. Infant's lips should be extended outward (flanged).  Swallowing heard between every 3-4 sucks once your milk has started to flow (after your let-down milk reflex occurs).  Muscle movement above and in front of his or her ears while sucking. Signs that your baby has not successfully latched onto your nipple  Sucking sounds or smacking sounds from your baby while breastfeeding.  Nipple pain. If you think your baby has not latched on correctly, slip your finger into the corner of your baby's mouth to break the suction and place it between your baby's gums. Attempt to start breastfeeding again. Signs of successful breastfeeding Signs from your baby  Your baby will gradually decrease the number of sucks or will completely stop sucking.  Your baby  will fall asleep.  Your baby's body will relax.  Your baby will retain a small amount of milk in his or her mouth.  Your baby will let go of your breast by himself or herself. Signs from you  Breasts that have increased in firmness, weight, and size 1-3 hours after feeding.  Breasts that are softer immediately after breastfeeding.  Increased milk volume, as well as a change in milk consistency and color by the fifth day of breastfeeding.  Nipples that are not sore, cracked, or bleeding. Signs that your baby is getting enough milk  Wetting at least 1-2 diapers during the first 24 hours after birth.  Wetting at least 5-6 diapers every 24 hours for the first week after birth. The urine should be clear or pale yellow by the age of 5 days.  Wetting 6-8 diapers every 24 hours as your baby continues to grow and develop.  At least 3 stools in a 24-hour period by the age of 5 days. The stool should be soft and yellow.  At least 3 stools in a 24-hour period by the age of 7 days. The stool should be seedy and yellow.  No loss of weight greater than 10% of birth weight during the first 3 days of life.  Average weight gain of 4-7 oz (113-198 g) per week after the age of 4 days.  Consistent daily weight gain by the age of 5 days, without weight loss after the age of 2 weeks. After a feeding, your baby may spit up a small amount of milk. This is normal. Breastfeeding frequency and duration Frequent feeding will help you make more milk and can prevent sore nipples and extremely full breasts (breast engorgement). Breastfeed when you feel the need to reduce the fullness of your breasts or when your baby shows signs of hunger. This is called "breastfeeding on demand." Signs that your baby is hungry include:  Increased alertness, activity, or restlessness.  Movement of the head from side to side.  Opening of the mouth when the corner of the mouth or cheek is stroked (rooting).  Increased  sucking sounds, smacking lips, cooing, sighing, or squeaking.  Hand-to-mouth movements and sucking on fingers or hands.  Fussing or crying. Avoid introducing a pacifier to your baby in the first 4-6 weeks after your baby is born. After this time, you may choose to use a pacifier. Research has shown that pacifier use during the first year of a baby's life decreases the risk of sudden infant death syndrome (SIDS). Allow your baby to feed on each breast as long as he or she wants. When your baby unlatches or falls asleep while feeding from the   first breast, offer the second breast. Because newborns are often sleepy in the first few weeks of life, you may need to awaken your baby to get him or her to feed. Breastfeeding times will vary from baby to baby. However, the following rules can serve as a guide to help you make sure that your baby is properly fed:  Newborns (babies 4 weeks of age or younger) may breastfeed every 1-3 hours.  Newborns should not go without breastfeeding for longer than 3 hours during the day or 5 hours during the night.  You should breastfeed your baby a minimum of 8 times in a 24-hour period. Breast milk pumping Pumping and storing breast milk allows you to make sure that your baby is exclusively fed your breast milk, even at times when you are unable to breastfeed. This is especially important if you go back to work while you are still breastfeeding, or if you are not able to be present during feedings. Your lactation consultant can help you find a method of pumping that works best for you and give you guidelines about how long it is safe to store breast milk.      Caring for your breasts while you breastfeed Nipples can become dry, cracked, and sore while breastfeeding. The following recommendations can help keep your breasts moisturized and healthy:  Avoid using soap on your nipples.  Wear a supportive bra designed especially for nursing. Avoid wearing underwire-style  bras or extremely tight bras (sports bras).  Air-dry your nipples for 3-4 minutes after each feeding.  Use only cotton bra pads to absorb leaked breast milk. Leaking of breast milk between feedings is normal.  Use lanolin on your nipples after breastfeeding. Lanolin helps to maintain your skin's normal moisture barrier. Pure lanolin is not harmful (not toxic) to your baby. You may also hand express a few drops of breast milk and gently massage that milk into your nipples and allow the milk to air-dry. In the first few weeks after giving birth, some women experience breast engorgement. Engorgement can make your breasts feel heavy, warm, and tender to the touch. Engorgement peaks within 3-5 days after you give birth. The following recommendations can help to ease engorgement:  Completely empty your breasts while breastfeeding or pumping. You may want to start by applying warm, moist heat (in the shower or with warm, water-soaked hand towels) just before feeding or pumping. This increases circulation and helps the milk flow. If your baby does not completely empty your breasts while breastfeeding, pump any extra milk after he or she is finished.  Apply ice packs to your breasts immediately after breastfeeding or pumping, unless this is too uncomfortable for you. To do this: ? Put ice in a plastic bag. ? Place a towel between your skin and the bag. ? Leave the ice on for 20 minutes, 2-3 times a day.  Make sure that your baby is latched on and positioned properly while breastfeeding. If engorgement persists after 48 hours of following these recommendations, contact your health care provider or a lactation consultant. Overall health care recommendations while breastfeeding  Eat 3 healthy meals and 3 snacks every day. Well-nourished mothers who are breastfeeding need an additional 450-500 calories a day. You can meet this requirement by increasing the amount of a balanced diet that you eat.  Drink  enough water to keep your urine pale yellow or clear.  Rest often, relax, and continue to take your prenatal vitamins to prevent fatigue, stress, and low   vitamin and mineral levels in your body (nutrient deficiencies).  Do not use any products that contain nicotine or tobacco, such as cigarettes and e-cigarettes. Your baby may be harmed by chemicals from cigarettes that pass into breast milk and exposure to secondhand smoke. If you need help quitting, ask your health care provider.  Avoid alcohol.  Do not use illegal drugs or marijuana.  Talk with your health care provider before taking any medicines. These include over-the-counter and prescription medicines as well as vitamins and herbal supplements. Some medicines that may be harmful to your baby can pass through breast milk.  It is possible to become pregnant while breastfeeding. If birth control is desired, ask your health care provider about options that will be safe while breastfeeding your baby. Where to find more information: [employer]: www.llli.org Contact a health care provider if:  You feel like you want to stop breastfeeding or have become frustrated with breastfeeding.  Your nipples are cracked or bleeding.  Your breasts are red, tender, or warm.  You have: ? Painful breasts or nipples. ? A swollen area on either breast. ? A fever or chills. ? Nausea or vomiting. ? Drainage other than breast milk from your nipples.  Your breasts do not become full before feedings by the fifth day after you give birth.  You feel sad and depressed.  Your baby is: ? Too sleepy to eat well. ? Having trouble sleeping. ? More than 1 week old and wetting fewer than 6 diapers in a 24-hour period. ? Not gaining weight by 5 days of age.  Your baby has fewer than 3 stools in a 24-hour period.  Your baby's skin or the white parts of his or her eyes become yellow. Get help right away if:  Your baby is overly tired  (lethargic) and does not want to wake up and feed.  Your baby develops an unexplained fever. Summary  Breastfeeding offers many health benefits for infant and mothers.  Try to breastfeed your infant when he or she shows early signs of hunger.  Gently tickle or stroke your baby's lips with your finger or nipple to allow the baby to open his or her mouth. Bring the baby to your breast. Make sure that much of the areola is in your baby's mouth. Offer one side and burp the baby before you offer the other side.  Talk with your health care provider or lactation consultant if you have questions or you face problems as you breastfeed. This information is not intended to replace advice given to you by your health care provider. Make sure you discuss any questions you have with your health care provider. Document Revised: 06/24/2017 Document Reviewed: 05/01/2016 Elsevier Patient Education  2021 [employer] Inc.  

## 2020-06-04 NOTE — Progress Notes (Deleted)
   Subjective:  Andrea [name] is a 30 y.o. [phone] at [redacted]w[redacted]d being seen today for ongoing prenatal care.  She is currently monitored for the following issues for this high-risk pregnancy and has History of cesarean section; BMI 40.0-44.9, adult (HCC); Healthcare maintenance; History of preterm delivery, currently pregnant; Obesity in pregnancy; Supervision of high risk pregnancy, antepartum; Preexisting diabetes complicating pregnancy, antepartum; Chronic hypertension during pregnancy, antepartum; HBB carrier; and Non-compliance on their problem list.  Patient reports no complaints.  Contractions: Not present. Vag. Bleeding: None.  Movement: Present. Denies leaking of fluid.   The following portions of the patient's history were reviewed and updated as appropriate: allergies, current medications, past family history, past medical history, past social history, past surgical history and problem list. Problem list updated.  Objective:   Vitals:   06/04/20 1121  BP: 137/88  Pulse: 88  Weight: 275 lb 3.2 oz (124.8 kg)    Fetal Status: Fetal Heart Rate (bpm): 152   Movement: Present     General:  Alert, oriented and cooperative. Patient is in no acute distress.  Skin: Skin is warm and dry. No rash noted.   Cardiovascular: Normal heart rate noted  Respiratory: Normal respiratory effort, no problems with respiration noted  Abdomen: Soft, gravid, appropriate for gestational age. Pain/Pressure: Absent     Pelvic: Vag. Bleeding: None     Cervical exam deferred        Extremities: Normal range of motion.  Edema: None  Mental Status: Normal mood and affect. Normal behavior. Normal judgment and thought content.   Urinalysis:      Assessment and Plan:  Pregnancy: G3P1102 at [redacted]w[redacted]d  1. Supervision of high risk pregnancy, antepartum FHR and BP normal  2. Obesity in pregnancy ***  3. History of preterm delivery, currently pregnant ***  4. History of cesarean section ***  5.  Preexisting diabetes complicating pregnancy, antepartum ***  6. Chronic hypertension during pregnancy, antepartum ***  {Blank single:19197::"Term","Preterm"} labor symptoms and general obstetric precautions including but not limited to vaginal bleeding, contractions, leaking of fluid and fetal movement were reviewed in detail with the patient. Please refer to After Visit Summary for other counseling recommendations.  Return in 1 week (on 06/11/2020).   Venora Maples, MD

## 2020-06-11 ENCOUNTER — Ambulatory Visit (INDEPENDENT_AMBULATORY_CARE_PROVIDER_SITE_OTHER): Payer: BC Managed Care – PPO | Admitting: Obstetrics and Gynecology

## 2020-06-11 ENCOUNTER — Ambulatory Visit: Payer: Medicaid Other | Admitting: *Deleted

## 2020-06-11 ENCOUNTER — Other Ambulatory Visit (HOSPITAL_COMMUNITY)
Admission: RE | Admit: 2020-06-11 | Discharge: 2020-06-11 | Disposition: A | Discharge status: Home / Self Care | Payer: Medicaid Other | Source: Ambulatory Visit | Attending: Obstetrics and Gynecology | Admitting: Obstetrics and Gynecology

## 2020-06-11 ENCOUNTER — Encounter: Payer: Self-pay | Admitting: *Deleted

## 2020-06-11 ENCOUNTER — Other Ambulatory Visit: Payer: Self-pay

## 2020-06-11 ENCOUNTER — Ambulatory Visit (HOSPITAL_BASED_OUTPATIENT_CLINIC_OR_DEPARTMENT_OTHER): Payer: Medicaid Other

## 2020-06-11 ENCOUNTER — Other Ambulatory Visit: Payer: Self-pay | Admitting: Obstetrics and Gynecology

## 2020-06-11 VITALS — BP 145/94 | HR 89 | Wt 273.8 lb

## 2020-06-11 DIAGNOSIS — O24313 Unspecified pre-existing diabetes mellitus in pregnancy, third trimester: Secondary | ICD-10-CM | POA: Diagnosis not present

## 2020-06-11 DIAGNOSIS — O10013 Pre-existing essential hypertension complicating pregnancy, third trimester: Secondary | ICD-10-CM

## 2020-06-11 DIAGNOSIS — O0990 Supervision of high risk pregnancy, unspecified, unspecified trimester: Secondary | ICD-10-CM | POA: Insufficient documentation

## 2020-06-11 DIAGNOSIS — O24319 Unspecified pre-existing diabetes mellitus in pregnancy, unspecified trimester: Secondary | ICD-10-CM

## 2020-06-11 DIAGNOSIS — O99113 Other diseases of the blood and blood-forming organs and certain disorders involving the immune mechanism complicating pregnancy, third trimester: Secondary | ICD-10-CM

## 2020-06-11 DIAGNOSIS — O99213 Obesity complicating pregnancy, third trimester: Secondary | ICD-10-CM | POA: Diagnosis not present

## 2020-06-11 DIAGNOSIS — Z3A38 38 weeks gestation of pregnancy: Secondary | ICD-10-CM

## 2020-06-11 DIAGNOSIS — Z98891 History of uterine scar from previous surgery: Secondary | ICD-10-CM

## 2020-06-11 DIAGNOSIS — Z862 Personal history of diseases of the blood and blood-forming organs and certain disorders involving the immune mechanism: Secondary | ICD-10-CM

## 2020-06-11 DIAGNOSIS — O24113 Pre-existing diabetes mellitus, type 2, in pregnancy, third trimester: Secondary | ICD-10-CM | POA: Insufficient documentation

## 2020-06-11 DIAGNOSIS — O34219 Maternal care for unspecified type scar from previous cesarean delivery: Secondary | ICD-10-CM | POA: Diagnosis not present

## 2020-06-11 DIAGNOSIS — O10919 Unspecified pre-existing hypertension complicating pregnancy, unspecified trimester: Secondary | ICD-10-CM | POA: Insufficient documentation

## 2020-06-11 DIAGNOSIS — Z3A36 36 weeks gestation of pregnancy: Secondary | ICD-10-CM

## 2020-06-11 DIAGNOSIS — O99210 Obesity complicating pregnancy, unspecified trimester: Secondary | ICD-10-CM

## 2020-06-11 DIAGNOSIS — Z6841 Body Mass Index (BMI) 40.0 and over, adult: Secondary | ICD-10-CM

## 2020-06-11 NOTE — Progress Notes (Signed)
Pt did not bring Paper Glucose readings

## 2020-06-11 NOTE — Progress Notes (Signed)
Prenatal Visit Note Date: 06/11/2020 Clinic: [facility]  Subjective:  Andrea [name] is a 30 y.o. [phone] at [redacted]w[redacted]d being seen today for ongoing prenatal care.  She is currently monitored for the following issues for this high-risk pregnancy and has History of cesarean section; BMI 40.0-44.9, adult (HCC); Healthcare maintenance; History of preterm delivery, currently pregnant; Obesity in pregnancy; Supervision of high risk pregnancy, antepartum; Preexisting diabetes complicating pregnancy, antepartum; Chronic hypertension during pregnancy, antepartum; HBB carrier; and Non-compliance on their problem list.  Patient reports no complaints.   Contractions: Not present. Vag. Bleeding: None.  Movement: Present. Denies leaking of fluid.   The following portions of the patient's history were reviewed and updated as appropriate: allergies, current medications, past family history, past medical history, past social history, past surgical history and problem list. Problem list updated.  Objective:   Vitals:   06/11/20 1121  BP: (!) 145/94  Pulse: 89  Weight: 273 lb 12.8 oz (124.2 kg)    Fetal Status: Fetal Heart Rate (bpm): 153   Movement: Present     General:  Alert, oriented and cooperative. Patient is in no acute distress.  Skin: Skin is warm and dry. No rash noted.   Cardiovascular: Normal heart rate noted  Respiratory: Normal respiratory effort, no problems with respiration noted  Abdomen: Soft, gravid, appropriate for gestational age. Pain/Pressure: Absent     Pelvic:  Cervical exam deferred        Extremities: Normal range of motion.  Edema: None  Mental Status: Normal mood and affect. Normal behavior. Normal judgment and thought content.   Urinalysis:      Assessment and Plan:  Pregnancy: G3P1102 at [redacted]w[redacted]d  1. Supervision of high risk pregnancy, antepartum Routine care. No BTL. Continue low dose ASA - GC/Chlamydia probe amp ()not at [facility] -  Culture, beta strep (group b only)  2. Chronic hypertension during pregnancy, antepartum On no meds. Normal BP upstairs before u/s. Continue to follow  3. Preexisting diabetes complicating pregnancy, antepartum On Lantus 52 qhs. Occasional 2h PP dinner in the 120s-130s that she attributes to snacks. Dietary recs d/w her bpp 8/8 today  4. Obesity in pregnancy  5. BMI 40.0-44.9, adult (HCC)  6. History of cesarean section Scheduled for 39/0 already  Preterm labor symptoms and general obstetric precautions including but not limited to vaginal bleeding, contractions, leaking of fluid and fetal movement were reviewed in detail with the patient. Please refer to After Visit Summary for other counseling recommendations.  Return in about 1 week (around 06/18/2020) for in person, md visit.   [place] Bing, MD

## 2020-06-12 LAB — GC/CHLAMYDIA PROBE AMP (~~LOC~~) NOT AT ARMC
Chlamydia: NEGATIVE
Comment: NEGATIVE
Comment: NORMAL
Neisseria Gonorrhea: NEGATIVE

## 2020-06-14 ENCOUNTER — Encounter (HOSPITAL_COMMUNITY): Payer: Self-pay | Admitting: Obstetrics & Gynecology

## 2020-06-14 ENCOUNTER — Other Ambulatory Visit: Payer: Self-pay

## 2020-06-14 ENCOUNTER — Inpatient Hospital Stay (HOSPITAL_COMMUNITY)
Admission: AD | Admit: 2020-06-14 | Discharge: 2020-06-14 | Disposition: A | Discharge status: Home / Self Care | Payer: BC Managed Care – PPO | Attending: Obstetrics & Gynecology | Admitting: Obstetrics & Gynecology

## 2020-06-14 DIAGNOSIS — O10913 Unspecified pre-existing hypertension complicating pregnancy, third trimester: Secondary | ICD-10-CM

## 2020-06-14 DIAGNOSIS — Z3A36 36 weeks gestation of pregnancy: Secondary | ICD-10-CM | POA: Diagnosis not present

## 2020-06-14 DIAGNOSIS — R102 Pelvic and perineal pain: Secondary | ICD-10-CM

## 2020-06-14 DIAGNOSIS — Z794 Long term (current) use of insulin: Secondary | ICD-10-CM | POA: Diagnosis not present

## 2020-06-14 DIAGNOSIS — O26893 Other specified pregnancy related conditions, third trimester: Secondary | ICD-10-CM | POA: Diagnosis not present

## 2020-06-14 DIAGNOSIS — Z7982 Long term (current) use of aspirin: Secondary | ICD-10-CM | POA: Diagnosis not present

## 2020-06-14 DIAGNOSIS — O10919 Unspecified pre-existing hypertension complicating pregnancy, unspecified trimester: Secondary | ICD-10-CM

## 2020-06-14 DIAGNOSIS — Z87891 Personal history of nicotine dependence: Secondary | ICD-10-CM | POA: Insufficient documentation

## 2020-06-14 DIAGNOSIS — O24113 Pre-existing diabetes mellitus, type 2, in pregnancy, third trimester: Secondary | ICD-10-CM | POA: Insufficient documentation

## 2020-06-14 LAB — COMPREHENSIVE METABOLIC PANEL
ALT: 11 U/L (ref 0–44)
AST: 12 U/L — ABNORMAL LOW (ref 15–41)
Albumin: 2.7 g/dL — ABNORMAL LOW (ref 3.5–5.0)
Alkaline Phosphatase: 99 U/L (ref 38–126)
Anion gap: 11 (ref 5–15)
BUN: 5 mg/dL — ABNORMAL LOW (ref 6–20)
CO2: 19 mmol/L — ABNORMAL LOW (ref 22–32)
Calcium: 8.6 mg/dL — ABNORMAL LOW (ref 8.9–10.3)
Chloride: 105 mmol/L (ref 98–111)
Creatinine, Ser: 0.64 mg/dL (ref 0.44–1.00)
GFR, Estimated: >60 mL/min (ref >60)
Glucose, Bld: 174 mg/dL — ABNORMAL HIGH (ref 70–99)
Potassium: 3.7 mmol/L (ref 3.5–5.1)
Sodium: 135 mmol/L (ref 135–145)
Total Bilirubin: 0.7 mg/dL (ref 0.3–1.2)
Total Protein: 6.6 g/dL (ref 6.5–8.1)

## 2020-06-14 LAB — CBC
HCT: 34.2 % — ABNORMAL LOW (ref 36.0–46.0)
Hemoglobin: 11.1 g/dL — ABNORMAL LOW (ref 12.0–15.0)
MCH: 26.4 pg (ref 26.0–34.0)
MCHC: 32.5 g/dL (ref 30.0–36.0)
MCV: 81.2 fL (ref 80.0–100.0)
Platelets: 297 10*3/uL (ref 150–400)
RBC: 4.21 MIL/uL (ref 3.87–5.11)
RDW: 14.6 % (ref 11.5–15.5)
WBC: 8 10*3/uL (ref 4.0–10.5)
nRBC: 0 % (ref 0.0–0.2)

## 2020-06-14 LAB — URINALYSIS, ROUTINE W REFLEX MICROSCOPIC
Bilirubin Urine: NEGATIVE
Glucose, UA: NEGATIVE mg/dL
Hgb urine dipstick: NEGATIVE
Ketones, ur: NEGATIVE mg/dL
Leukocytes,Ua: NEGATIVE
Nitrite: NEGATIVE
Protein, ur: NEGATIVE mg/dL
Specific Gravity, Urine: 1.014 (ref 1.005–1.030)
pH: 6 (ref 5.0–8.0)

## 2020-06-14 LAB — PROTEIN / CREATININE RATIO, URINE
Creatinine, Urine: 127.07 mg/dL
Protein Creatinine Ratio: 0.09 mg/mg{Cre} (ref 0.00–0.15)
Total Protein, Urine: 11 mg/dL

## 2020-06-14 LAB — CULTURE, BETA STREP (GROUP B ONLY): Strep Gp B Culture: POSITIVE — AB

## 2020-06-14 MED ORDER — CYCLOBENZAPRINE HCL 10 MG PO TABS: 10.0000 mg | ORAL_TABLET | Freq: Three times a day (TID) | ORAL | 0 refills | Status: DC | PRN

## 2020-06-14 MED ORDER — MORPHINE SULFATE (PF) 10 MG/ML IV SOLN
10.0000 mg | Freq: Once | INTRAVENOUS | Status: AC
  Administered 2020-06-14: 10 mg via INTRAMUSCULAR
  Filled 2020-06-14: qty 1

## 2020-06-14 NOTE — MAU Provider Note (Signed)
History     CSN: [id number]  Arrival date and time: 06/14/20 1249   Event Date/Time   First Provider Initiated Contact with Patient 06/14/20 1334      Chief Complaint  Patient presents with  . Pelvic Pressure   HPI  Andrea [name] is a 30 y.o. female [phone] @ 49w6dwith a Hx of CHTN (not on medication) and Type 2 DM, here in MAU with complaints of pelvic pressure. She reports the pressure has worsened over the last few days. The pressure worsens when she walks or if she does to much around the house or with kids. She is scheduled for a C/s @ 39 weeks. She reports no HA, scotoma or bleeding. + fetal movement. She does not have a pregnancy support belt, and has not tried anything OTC for the pain.   OB History    Gravida  3   Para  2   Term  1   Preterm  1   AB  0   Living  2     SAB  0   IAB  0   Ectopic  0   Multiple  0   Live Births  2           Past Medical History:  Diagnosis Date  . Anemia    as a teenager  . Chicken pox   . Diabetes mellitus    type 2 on insulin  . Impacted teeth with abnormal position    impacted wisdom teeth    Past Surgical History:  Procedure Laterality Date  . CESAREAN SECTION N/A 09/16/2015   Procedure: CESAREAN SECTION;  Surgeon: KGuss Bunde MD;  [place]  Service: Obstetrics;  Laterality: N/A;  . CESAREAN SECTION N/A 02/06/2018   Procedure: CESAREAN SECTION;  Surgeon: EFlorian Buff MD;  [place] [facility]  Service: Obstetrics;  Laterality: N/A;  . FOREIGN BODY REMOVAL Left 08/28/2015   Procedure: REMOVAL FOREIGN BODY foot;  Surgeon: SVickey Huger MD;  [place] [facility]  Service: Orthopedics;  Laterality: Left;  regional block  . NO PAST SURGERIES    . TOOTH EXTRACTION N/A 07/24/2016   Procedure: EXTRACTION WISDOM TEETH ONE, SIXTEEN, SEVENTEEN AND THIRTY-TWO;  Surgeon: SDiona Browner DDS;  [place] [facility]  Service: Oral Surgery;  Laterality: N/A;    Family History  Problem  Relation Age of Onset  . Stroke Father   . Hypertension Father   . Diabetes Mellitus II Father   . Irregular heart beat Father   . Heart disease Father   . Hyperlipidemia Father   . Diabetes Mellitus II Mother   . Hyperlipidemia Mother   . Thyroid disease Mother   . Hypertension Maternal Aunt   . Thyroid disease Maternal Aunt   . Diabetes Mellitus II Maternal Grandmother   . Anesthesia problems Neg Hx     Social History   Tobacco Use  . Smoking status: Former Smoker    Types: Cigars    Quit date: 11/09/2019    Years since quitting: 0.5  . Smokeless tobacco: Never Used  Vaping Use  . Vaping Use: Never used  Substance Use Topics  . Alcohol use: Not Currently    Comment: rare  . Drug use: No    Allergies:  Allergies  Allergen Reactions  . Metformin And Related Diarrhea    Medications Prior to Admission  Medication Sig Dispense Refill Last Dose  . aspirin EC 81 MG tablet Take 1 tablet (81 mg  total) by mouth daily. Swallow whole. 30 tablet 11 06/14/2020 at Unknown time  . insulin glargine (LANTUS SOLOSTAR) 100 UNIT/ML Solostar Pen Inject 58 Units into the skin daily. 30 mL 3 06/14/2020 at Unknown time  . Insulin Pen Needle 31G X 5 MM MISC 1 each by Does not apply route in the morning and at bedtime. For Victoza and Lantus 180 each 1 06/14/2020 at Unknown time  . Prenatal Vit-Fe Fumarate-FA (PRENATAL VITAMIN PO) Take 1 tablet by mouth daily.   06/14/2020 at Unknown time  . Blood Glucose Monitoring Suppl (TRUE METRIX METER) w/Device KIT Use as instructed. Check blood glucose level by fingerstick 2 times  per day. 1 kit 0   . glucose blood (TRUE METRIX BLOOD GLUCOSE TEST) test strip Use as instructed. Check blood glucose level by fingerstick twice per day. 100 each 12   . TRUEplus Lancets 28G MISC Use as instructed. Check blood glucose level by fingerstick twice per day. 100 each 3   . TRUEPLUS PEN NEEDLES 31G X 8 MM MISC 1 EACH BY DOES NOT APPLY ROUTE IN THE MORNING AND AT BEDTIME. FOR  VICTOZA AND LANTUS      Results for orders placed or performed during the hospital encounter of 06/14/20 (from the past 48 hour(s))  CBC     Status: Abnormal   Collection Time: 06/14/20  1:34 PM  Result Value Ref Range   WBC 8.0 4.0 - 10.5 K/uL   RBC 4.21 3.87 - 5.11 MIL/uL   Hemoglobin 11.1 (L) 12.0 - 15.0 g/dL   HCT 34.2 (L) 36.0 - 46.0 %   MCV 81.2 80.0 - 100.0 fL   MCH 26.4 26.0 - 34.0 pg   MCHC 32.5 30.0 - 36.0 g/dL   RDW 14.6 11.5 - 15.5 %   Platelets 297 150 - 400 K/uL   nRBC 0.0 0.0 - 0.2 %    Comment: Performed at [facility], [place] [address], [place], [place] [address]  Comprehensive metabolic panel     Status: Abnormal   Collection Time: 06/14/20  1:34 PM  Result Value Ref Range   Sodium 135 135 - 145 mmol/L   Potassium 3.7 3.5 - 5.1 mmol/L   Chloride 105 98 - 111 mmol/L   CO2 19 (L) 22 - 32 mmol/L   Glucose, Bld 174 (H) 70 - 99 mg/dL    Comment: Glucose reference range applies only to samples taken after fasting for at least 8 hours.   BUN 5 (L) 6 - 20 mg/dL   Creatinine, Ser 0.64 0.44 - 1.00 mg/dL   Calcium 8.6 (L) 8.9 - 10.3 mg/dL   Total Protein 6.6 6.5 - 8.1 g/dL   Albumin 2.7 (L) 3.5 - 5.0 g/dL   AST 12 (L) 15 - 41 U/L   ALT 11 0 - 44 U/L   Alkaline Phosphatase 99 38 - 126 U/L   Total Bilirubin 0.7 0.3 - 1.2 mg/dL   GFR, Estimated >60 >60 mL/min    Comment: (NOTE) Calculated using the CKD-EPI Creatinine Equation (2021)    Anion gap 11 5 - 15    Comment: Performed at [facility], [place] [address], [place], [place] [address]  Urinalysis, Routine w reflex microscopic Urine, Clean Catch     Status: Abnormal   Collection Time: 06/14/20  2:07 PM  Result Value Ref Range   Color, Urine YELLOW YELLOW   APPearance CLOUDY (A) CLEAR   Specific Gravity, Urine 1.014 1.005 - 1.030  pH 6.0 5.0 - 8.0   Glucose, UA NEGATIVE NEGATIVE mg/dL   Hgb urine dipstick NEGATIVE NEGATIVE   Bilirubin Urine NEGATIVE NEGATIVE   Ketones, ur NEGATIVE NEGATIVE mg/dL    Protein, ur NEGATIVE NEGATIVE mg/dL   Nitrite NEGATIVE NEGATIVE   Leukocytes,Ua NEGATIVE NEGATIVE    Comment: Performed at [facility] [address] [place], [place] [address]  Protein / creatinine ratio, urine     Status: None   Collection Time: 06/14/20  2:07 PM  Result Value Ref Range   Creatinine, Urine 127.07 mg/dL   Total Protein, Urine 11 mg/dL    Comment: NO NORMAL RANGE ESTABLISHED FOR THIS TEST   Protein Creatinine Ratio 0.09 0.00 - 0.15 mg/mg[Cre]    Comment: Performed at [facility] [address] [place], [place] [address]    Review of Systems  Eyes: Negative for photophobia and visual disturbance.  Genitourinary: Positive for pelvic pain. Negative for dysuria.  Neurological: Negative for headaches.   Physical Exam   Blood pressure (!) 143/89, pulse 88, temperature 98.3 F (36.8 C), temperature source Oral, resp. rate 18, height _0  (1.575 m), weight 124.2 kg, last menstrual period 09/30/2019, SpO2 96 %.   Patient Vitals for the past 24 hrs:  BP Temp Temp src Pulse Resp SpO2 Height Weight  06/14/20 1559 (!) 113/54 -- -- (!) 101 18 -- -- --  06/14/20 1516 (!) 144/90 -- -- 86 -- -- -- --  06/14/20 1431 (!) 143/89 -- -- 88 -- -- -- --  06/14/20 1401 (!) 145/88 -- -- 89 -- -- -- --  06/14/20 1347 (!) 144/84 -- -- 92 -- -- -- --  06/14/20 1321 (!) 149/95 -- -- 96 -- -- -- --  06/14/20 1315 (!) 151/91 -- -- 95 -- -- -- --  06/14/20 1259 137/83 98.3 F (36.8 C) Oral 96 18 96 % -- --  06/14/20 1256 -- -- -- -- -- -- _1  (1.575 m) 124.2 kg   Physical Exam Vitals and nursing note reviewed.  Constitutional:      General: She is not in acute distress.    Appearance: Normal appearance. She is obese. She is not toxic-appearing or diaphoretic.  Genitourinary:    Comments: Cervix: 1.5 cm, thick, anterior. Exam by: Anderson Malta, NP  Musculoskeletal:        General: Normal range of motion.  Neurological:     Mental Status: She is alert and oriented to  person, place, and time.     Deep Tendon Reflexes: Reflexes normal.     Comments: Negative clonus   Psychiatric:        Behavior: Behavior normal.   Fetal Tracing: Baseline: 145 bpm  Variability: Moderate  Accelerations: 15x15 Decelerations: None Toco: None   MAU Course  Procedures  None  MDM  BP's not severe range PIH labs collected and WNL Cervix was checked by RN and was reported closed, patient was rechecked by myself and I noted her to be 1.5 and thick.  Reviewed with Dr. Rip Harbour. Recommends pain medication in which patient is agreeable too.  Morphine 10 mg IM given.  No contractions noted on exam.   Assessment and Plan   A:  1. Pelvic pressure in female   2. Chronic hypertension during pregnancy, antepartum   3. [redacted] weeks gestation of pregnancy     P:  Discharge home with strict return precautions Rx: Flexeril Return to MAU if symptoms worsen Labor precautions Pre E precautions Keep  your appointment in the office on Tuesday for BP check   Annamay Laymon, Artist Pais, NP 06/14/2020 5:24 PM

## 2020-06-14 NOTE — MAU Note (Signed)
Presents with c/o pelvic pressure that started last night.  Denies VB or LOF.  Endorses +FM.

## 2020-06-17 MED FILL — BASAGLAR 100 UNIT/ML KWIKPE: 100 | 25 days supply | Qty: 15 | Fill #5

## 2020-06-18 ENCOUNTER — Ambulatory Visit: Payer: Medicaid Other | Attending: Obstetrics

## 2020-06-18 ENCOUNTER — Ambulatory Visit: Payer: Medicaid Other | Admitting: *Deleted

## 2020-06-18 ENCOUNTER — Ambulatory Visit (INDEPENDENT_AMBULATORY_CARE_PROVIDER_SITE_OTHER): Payer: BC Managed Care – PPO | Admitting: Family Medicine

## 2020-06-18 ENCOUNTER — Encounter (HOSPITAL_COMMUNITY): Payer: Self-pay

## 2020-06-18 ENCOUNTER — Encounter: Payer: Self-pay | Admitting: *Deleted

## 2020-06-18 ENCOUNTER — Other Ambulatory Visit: Payer: Self-pay

## 2020-06-18 ENCOUNTER — Encounter: Payer: Self-pay | Admitting: Family Medicine

## 2020-06-18 VITALS — BP 156/90 | HR 84 | Wt 275.5 lb

## 2020-06-18 DIAGNOSIS — O10919 Unspecified pre-existing hypertension complicating pregnancy, unspecified trimester: Secondary | ICD-10-CM

## 2020-06-18 DIAGNOSIS — O99210 Obesity complicating pregnancy, unspecified trimester: Secondary | ICD-10-CM

## 2020-06-18 DIAGNOSIS — R519 Headache, unspecified: Secondary | ICD-10-CM

## 2020-06-18 DIAGNOSIS — O09899 Supervision of other high risk pregnancies, unspecified trimester: Secondary | ICD-10-CM

## 2020-06-18 DIAGNOSIS — O34219 Maternal care for unspecified type scar from previous cesarean delivery: Secondary | ICD-10-CM

## 2020-06-18 DIAGNOSIS — O24113 Pre-existing diabetes mellitus, type 2, in pregnancy, third trimester: Secondary | ICD-10-CM | POA: Insufficient documentation

## 2020-06-18 DIAGNOSIS — O99213 Obesity complicating pregnancy, third trimester: Secondary | ICD-10-CM

## 2020-06-18 DIAGNOSIS — Z794 Long term (current) use of insulin: Secondary | ICD-10-CM

## 2020-06-18 DIAGNOSIS — Z3A37 37 weeks gestation of pregnancy: Secondary | ICD-10-CM

## 2020-06-18 DIAGNOSIS — O10013 Pre-existing essential hypertension complicating pregnancy, third trimester: Secondary | ICD-10-CM

## 2020-06-18 DIAGNOSIS — E669 Obesity, unspecified: Secondary | ICD-10-CM

## 2020-06-18 DIAGNOSIS — O24319 Unspecified pre-existing diabetes mellitus in pregnancy, unspecified trimester: Secondary | ICD-10-CM

## 2020-06-18 DIAGNOSIS — Z98891 History of uterine scar from previous surgery: Secondary | ICD-10-CM

## 2020-06-18 DIAGNOSIS — Z862 Personal history of diseases of the blood and blood-forming organs and certain disorders involving the immune mechanism: Secondary | ICD-10-CM

## 2020-06-18 DIAGNOSIS — O0990 Supervision of high risk pregnancy, unspecified, unspecified trimester: Secondary | ICD-10-CM

## 2020-06-18 LAB — POCT URINALYSIS DIP (DEVICE)
Bilirubin Urine: NEGATIVE
Glucose, UA: NEGATIVE mg/dL
Ketones, ur: NEGATIVE mg/dL
Nitrite: NEGATIVE
Protein, ur: NEGATIVE mg/dL
Specific Gravity, Urine: >=1.03 (ref 1.005–1.030)
Urobilinogen, UA: 0.2 mg/dL (ref 0.0–1.0)
pH: 6 (ref 5.0–8.0)

## 2020-06-18 MED ORDER — ACETAMINOPHEN 500 MG PO TABS
1000.0000 mg | ORAL_TABLET | Freq: Once | ORAL | Status: AC
  Administered 2020-06-18: 1000 mg via ORAL

## 2020-06-18 NOTE — Progress Notes (Signed)
   Subjective:  Andrea [name] is a 30 y.o. [phone] at [redacted]w[redacted]d being seen today for ongoing prenatal care.  She is currently monitored for the following issues for this high-risk pregnancy and has History of cesarean section; BMI 40.0-44.9, adult (HCC); Healthcare maintenance; History of preterm delivery, currently pregnant; Obesity in pregnancy; Supervision of high risk pregnancy, antepartum; Preexisting diabetes complicating pregnancy, antepartum; Chronic hypertension during pregnancy, antepartum; HBB carrier; and Non-compliance on their problem list.  Patient reports headache.  Contractions: Irregular. Vag. Bleeding: None.  Movement: Present. Denies leaking of fluid.   The following portions of the patient's history were reviewed and updated as appropriate: allergies, current medications, past family history, past medical history, past social history, past surgical history and problem list. Problem list updated.  Objective:   Vitals:   06/18/20 1059  BP: (!) 156/90  Pulse: 84  Weight: 275 lb 8 oz (125 kg)    Fetal Status: Fetal Heart Rate (bpm): 147   Movement: Present     General:  Alert, oriented and cooperative. Patient is in no acute distress.  Skin: Skin is warm and dry. No rash noted.   Cardiovascular: Normal heart rate noted  Respiratory: Normal respiratory effort, no problems with respiration noted  Abdomen: Soft, gravid, appropriate for gestational age. Pain/Pressure: Present     Pelvic: Vag. Bleeding: None     Cervical exam deferred        Extremities: Normal range of motion.  Edema: None  Mental Status: Normal mood and affect. Normal behavior. Normal judgment and thought content.   Urinalysis:      Assessment and Plan:  Pregnancy: G3P1102 at [redacted]w[redacted]d  1. Supervision of high risk pregnancy, antepartum BP as below, FHR normal BPP 8/8 today Declines contraception  2. Chronic hypertension during pregnancy, antepartum BP slightly elevated in our office but 140's/90's  in MFM Reports headache since this morning but has not taken anything, given 1g tylenol and water and instructed to go to MAU if headache does not resolve by early this afternoon Called after visit and reported headache was improving Per Dr. Parke Poisson who saw the patient earlier reasonable to move her cesarean date up as her BP's have been increasing and her dinner post prandials have been regularly elevated Message sent to move up one week earlier to 06/22/2020  3. Preexisting diabetes complicating pregnancy, antepartum In general well controlled with exception of dinner post prandials ranging 120-140's Per hx due to dietary indiscretions at night Counseled again  4. Obesity in pregnancy   5. History of preterm delivery, currently pregnant   6. History of cesarean section X2, see above  Term labor symptoms and general obstetric precautions including but not limited to vaginal bleeding, contractions, leaking of fluid and fetal movement were reviewed in detail with the patient. Please refer to After Visit Summary for other counseling recommendations.  Return in 1 week (on 06/25/2020).   Venora Maples, MD

## 2020-06-18 NOTE — Patient Instructions (Signed)
 Contraception Choices Contraception, also called birth control, refers to methods or devices that prevent pregnancy. Hormonal methods Contraceptive implant A contraceptive implant is a thin, plastic tube that contains a hormone that prevents pregnancy. It is different from an intrauterine device (IUD). It is inserted into the upper part of the arm by a health care provider. Implants can be effective for up to 3 years. Progestin-only injections Progestin-only injections are injections of progestin, a synthetic form of the hormone progesterone. They are given every 3 months by a health care provider. Birth control pills Birth control pills are pills that contain hormones that prevent pregnancy. They must be taken once a day, preferably at the same time each day. A prescription is needed to use this method of contraception. Birth control patch The birth control patch contains hormones that prevent pregnancy. It is placed on the skin and must be changed once a week for three weeks and removed on the fourth week. A prescription is needed to use this method of contraception. Vaginal ring A vaginal ring contains hormones that prevent pregnancy. It is placed in the vagina for three weeks and removed on the fourth week. After that, the process is repeated with a new ring. A prescription is needed to use this method of contraception. Emergency contraceptive Emergency contraceptives prevent pregnancy after unprotected sex. They come in pill form and can be taken up to 5 days after sex. They work best the sooner they are taken after having sex. Most emergency contraceptives are available without a prescription. This method should not be used as your only form of birth control.   Barrier methods Female condom A female condom is a thin sheath that is worn over the penis during sex. Condoms keep sperm from going inside a woman's body. They can be used with a sperm-killing substance (spermicide) to increase their  effectiveness. They should be thrown away after one use. Female condom A female condom is a soft, loose-fitting sheath that is put into the vagina before sex. The condom keeps sperm from going inside a woman's body. They should be thrown away after one use. Diaphragm A diaphragm is a soft, dome-shaped barrier. It is inserted into the vagina before sex, along with a spermicide. The diaphragm blocks sperm from entering the uterus, and the spermicide kills sperm. A diaphragm should be left in the vagina for 6-8 hours after sex and removed within 24 hours. A diaphragm is prescribed and fitted by a health care provider. A diaphragm should be replaced every 1-2 years, after giving birth, after gaining more than 15 lb (6.8 kg), and after pelvic surgery. Cervical cap A cervical cap is a round, soft latex or plastic cup that fits over the cervix. It is inserted into the vagina before sex, along with spermicide. It blocks sperm from entering the uterus. The cap should be left in place for 6-8 hours after sex and removed within 48 hours. A cervical cap must be prescribed and fitted by a health care provider. It should be replaced every 2 years. Sponge A sponge is a soft, circular piece of polyurethane foam with spermicide in it. The sponge helps block sperm from entering the uterus, and the spermicide kills sperm. To use it, you make it wet and then insert it into the vagina. It should be inserted before sex, left in for at least 6 hours after sex, and removed and thrown away within 30 hours. Spermicides Spermicides are chemicals that kill or block sperm from entering the   cervix and uterus. They can come as a cream, jelly, suppository, foam, or tablet. A spermicide should be inserted into the vagina with an applicator at least 10-15 minutes before sex to allow time for it to work. The process must be repeated every time you have sex. Spermicides do not require a prescription.   Intrauterine  contraception Intrauterine device (IUD) An IUD is a T-shaped device that is put in a woman's uterus. There are two types:  Hormone IUD.This type contains progestin, a synthetic form of the hormone progesterone. This type can stay in place for 3-5 years.  Copper IUD.This type is wrapped in copper wire. It can stay in place for 10 years. Permanent methods of contraception Female tubal ligation In this method, a woman's fallopian tubes are sealed, tied, or blocked during surgery to prevent eggs from traveling to the uterus. Hysteroscopic sterilization In this method, a small, flexible insert is placed into each fallopian tube. The inserts cause scar tissue to form in the fallopian tubes and block them, so sperm cannot reach an egg. The procedure takes about 3 months to be effective. Another form of birth control must be used during those 3 months. Female sterilization This is a procedure to tie off the tubes that carry sperm (vasectomy). After the procedure, the man can still ejaculate fluid (semen). Another form of birth control must be used for 3 months after the procedure. Natural planning methods Natural family planning In this method, a couple does not have sex on days when the woman could become pregnant. Calendar method In this method, the woman keeps track of the length of each menstrual cycle, identifies the days when pregnancy can happen, and does not have sex on those days. Ovulation method In this method, a couple avoids sex during ovulation. Symptothermal method This method involves not having sex during ovulation. The woman typically checks for ovulation by watching changes in her temperature and in the consistency of cervical mucus. Post-ovulation method In this method, a couple waits to have sex until after ovulation. Where to find more information  Centers for Disease Control and Prevention: www.cdc.gov Summary  Contraception, also called birth control, refers to methods or  devices that prevent pregnancy.  Hormonal methods of contraception include implants, injections, pills, patches, vaginal rings, and emergency contraceptives.  Barrier methods of contraception can include female condoms, female condoms, diaphragms, cervical caps, sponges, and spermicides.  There are two types of IUDs (intrauterine devices). An IUD can be put in a woman's uterus to prevent pregnancy for 3-5 years.  Permanent sterilization can be done through a procedure for males and females. Natural family planning methods involve nothaving sex on days when the woman could become pregnant. This information is not intended to replace advice given to you by your health care provider. Make sure you discuss any questions you have with your health care provider. Document Revised: 09/04/2019 Document Reviewed: 09/04/2019 Elsevier Patient Education  2021 [employer] Inc.   Breastfeeding  Choosing to breastfeed is one of the best decisions you can make for yourself and your baby. A change in hormones during pregnancy causes your breasts to make breast milk in your milk-producing glands. Hormones prevent breast milk from being released before your baby is born. They also prompt milk flow after birth. Once breastfeeding has begun, thoughts of your baby, as well as his or her sucking or crying, can stimulate the release of milk from your milk-producing glands. Benefits of breastfeeding Research shows that breastfeeding offers many health benefits   for infants and mothers. It also offers a cost-free and convenient way to feed your baby. For your baby  Your first milk (colostrum) helps your baby's digestive system to function better.  Special cells in your milk (antibodies) help your baby to fight off infections.  Breastfed babies are less likely to develop asthma, allergies, obesity, or type 2 diabetes. They are also at lower risk for sudden infant death syndrome (SIDS).  Nutrients in breast milk are better  able to meet your baby's needs compared to infant formula.  Breast milk improves your baby's brain development. For you  Breastfeeding helps to create a very special bond between you and your baby.  Breastfeeding is convenient. Breast milk costs nothing and is always available at the correct temperature.  Breastfeeding helps to burn calories. It helps you to lose the weight that you gained during pregnancy.  Breastfeeding makes your uterus return faster to its size before pregnancy. It also slows bleeding (lochia) after you give birth.  Breastfeeding helps to lower your risk of developing type 2 diabetes, osteoporosis, rheumatoid arthritis, cardiovascular disease, and breast, ovarian, uterine, and endometrial cancer later in life. Breastfeeding basics Starting breastfeeding  Find a comfortable place to sit or lie down, with your neck and back well-supported.  Place a pillow or a rolled-up blanket under your baby to bring him or her to the level of your breast (if you are seated). Nursing pillows are specially designed to help support your arms and your baby while you breastfeed.  Make sure that your baby's tummy (abdomen) is facing your abdomen.  Gently massage your breast. With your fingertips, massage from the outer edges of your breast inward toward the nipple. This encourages milk flow. If your milk flows slowly, you may need to continue this action during the feeding.  Support your breast with 4 fingers underneath and your thumb above your nipple (make the letter "C" with your hand). Make sure your fingers are well away from your nipple and your baby's mouth.  Stroke your baby's lips gently with your finger or nipple.  When your baby's mouth is open wide enough, quickly bring your baby to your breast, placing your entire nipple and as much of the areola as possible into your baby's mouth. The areola is the colored area around your nipple. ? More areola should be visible above your  baby's upper lip than below the lower lip. ? Your baby's lips should be opened and extended outward (flanged) to ensure an adequate, comfortable latch. ? Your baby's tongue should be between his or her lower gum and your breast.  Make sure that your baby's mouth is correctly positioned around your nipple (latched). Your baby's lips should create a seal on your breast and be turned out (everted).  It is common for your baby to suck about 2-3 minutes in order to start the flow of breast milk. Latching Teaching your baby how to latch onto your breast properly is very important. An improper latch can cause nipple pain, decreased milk supply, and poor weight gain in your baby. Also, if your baby is not latched onto your nipple properly, he or she may swallow some air during feeding. This can make your baby fussy. Burping your baby when you switch breasts during the feeding can help to get rid of the air. However, teaching your baby to latch on properly is still the best way to prevent fussiness from swallowing air while breastfeeding. Signs that your baby has successfully latched onto   your nipple  Silent tugging or silent sucking, without causing you pain. Infant's lips should be extended outward (flanged).  Swallowing heard between every 3-4 sucks once your milk has started to flow (after your let-down milk reflex occurs).  Muscle movement above and in front of his or her ears while sucking. Signs that your baby has not successfully latched onto your nipple  Sucking sounds or smacking sounds from your baby while breastfeeding.  Nipple pain. If you think your baby has not latched on correctly, slip your finger into the corner of your baby's mouth to break the suction and place it between your baby's gums. Attempt to start breastfeeding again. Signs of successful breastfeeding Signs from your baby  Your baby will gradually decrease the number of sucks or will completely stop sucking.  Your baby  will fall asleep.  Your baby's body will relax.  Your baby will retain a small amount of milk in his or her mouth.  Your baby will let go of your breast by himself or herself. Signs from you  Breasts that have increased in firmness, weight, and size 1-3 hours after feeding.  Breasts that are softer immediately after breastfeeding.  Increased milk volume, as well as a change in milk consistency and color by the fifth day of breastfeeding.  Nipples that are not sore, cracked, or bleeding. Signs that your baby is getting enough milk  Wetting at least 1-2 diapers during the first 24 hours after birth.  Wetting at least 5-6 diapers every 24 hours for the first week after birth. The urine should be clear or pale yellow by the age of 5 days.  Wetting 6-8 diapers every 24 hours as your baby continues to grow and develop.  At least 3 stools in a 24-hour period by the age of 5 days. The stool should be soft and yellow.  At least 3 stools in a 24-hour period by the age of 7 days. The stool should be seedy and yellow.  No loss of weight greater than 10% of birth weight during the first 3 days of life.  Average weight gain of 4-7 oz (113-198 g) per week after the age of 4 days.  Consistent daily weight gain by the age of 5 days, without weight loss after the age of 2 weeks. After a feeding, your baby may spit up a small amount of milk. This is normal. Breastfeeding frequency and duration Frequent feeding will help you make more milk and can prevent sore nipples and extremely full breasts (breast engorgement). Breastfeed when you feel the need to reduce the fullness of your breasts or when your baby shows signs of hunger. This is called "breastfeeding on demand." Signs that your baby is hungry include:  Increased alertness, activity, or restlessness.  Movement of the head from side to side.  Opening of the mouth when the corner of the mouth or cheek is stroked (rooting).  Increased  sucking sounds, smacking lips, cooing, sighing, or squeaking.  Hand-to-mouth movements and sucking on fingers or hands.  Fussing or crying. Avoid introducing a pacifier to your baby in the first 4-6 weeks after your baby is born. After this time, you may choose to use a pacifier. Research has shown that pacifier use during the first year of a baby's life decreases the risk of sudden infant death syndrome (SIDS). Allow your baby to feed on each breast as long as he or she wants. When your baby unlatches or falls asleep while feeding from the   first breast, offer the second breast. Because newborns are often sleepy in the first few weeks of life, you may need to awaken your baby to get him or her to feed. Breastfeeding times will vary from baby to baby. However, the following rules can serve as a guide to help you make sure that your baby is properly fed:  Newborns (babies 4 weeks of age or younger) may breastfeed every 1-3 hours.  Newborns should not go without breastfeeding for longer than 3 hours during the day or 5 hours during the night.  You should breastfeed your baby a minimum of 8 times in a 24-hour period. Breast milk pumping Pumping and storing breast milk allows you to make sure that your baby is exclusively fed your breast milk, even at times when you are unable to breastfeed. This is especially important if you go back to work while you are still breastfeeding, or if you are not able to be present during feedings. Your lactation consultant can help you find a method of pumping that works best for you and give you guidelines about how long it is safe to store breast milk.      Caring for your breasts while you breastfeed Nipples can become dry, cracked, and sore while breastfeeding. The following recommendations can help keep your breasts moisturized and healthy:  Avoid using soap on your nipples.  Wear a supportive bra designed especially for nursing. Avoid wearing underwire-style  bras or extremely tight bras (sports bras).  Air-dry your nipples for 3-4 minutes after each feeding.  Use only cotton bra pads to absorb leaked breast milk. Leaking of breast milk between feedings is normal.  Use lanolin on your nipples after breastfeeding. Lanolin helps to maintain your skin's normal moisture barrier. Pure lanolin is not harmful (not toxic) to your baby. You may also hand express a few drops of breast milk and gently massage that milk into your nipples and allow the milk to air-dry. In the first few weeks after giving birth, some women experience breast engorgement. Engorgement can make your breasts feel heavy, warm, and tender to the touch. Engorgement peaks within 3-5 days after you give birth. The following recommendations can help to ease engorgement:  Completely empty your breasts while breastfeeding or pumping. You may want to start by applying warm, moist heat (in the shower or with warm, water-soaked hand towels) just before feeding or pumping. This increases circulation and helps the milk flow. If your baby does not completely empty your breasts while breastfeeding, pump any extra milk after he or she is finished.  Apply ice packs to your breasts immediately after breastfeeding or pumping, unless this is too uncomfortable for you. To do this: ? Put ice in a plastic bag. ? Place a towel between your skin and the bag. ? Leave the ice on for 20 minutes, 2-3 times a day.  Make sure that your baby is latched on and positioned properly while breastfeeding. If engorgement persists after 48 hours of following these recommendations, contact your health care provider or a lactation consultant. Overall health care recommendations while breastfeeding  Eat 3 healthy meals and 3 snacks every day. Well-nourished mothers who are breastfeeding need an additional 450-500 calories a day. You can meet this requirement by increasing the amount of a balanced diet that you eat.  Drink  enough water to keep your urine pale yellow or clear.  Rest often, relax, and continue to take your prenatal vitamins to prevent fatigue, stress, and low   vitamin and mineral levels in your body (nutrient deficiencies).  Do not use any products that contain nicotine or tobacco, such as cigarettes and e-cigarettes. Your baby may be harmed by chemicals from cigarettes that pass into breast milk and exposure to secondhand smoke. If you need help quitting, ask your health care provider.  Avoid alcohol.  Do not use illegal drugs or marijuana.  Talk with your health care provider before taking any medicines. These include over-the-counter and prescription medicines as well as vitamins and herbal supplements. Some medicines that may be harmful to your baby can pass through breast milk.  It is possible to become pregnant while breastfeeding. If birth control is desired, ask your health care provider about options that will be safe while breastfeeding your baby. Where to find more information: [employer]: www.llli.org Contact a health care provider if:  You feel like you want to stop breastfeeding or have become frustrated with breastfeeding.  Your nipples are cracked or bleeding.  Your breasts are red, tender, or warm.  You have: ? Painful breasts or nipples. ? A swollen area on either breast. ? A fever or chills. ? Nausea or vomiting. ? Drainage other than breast milk from your nipples.  Your breasts do not become full before feedings by the fifth day after you give birth.  You feel sad and depressed.  Your baby is: ? Too sleepy to eat well. ? Having trouble sleeping. ? More than 1 week old and wetting fewer than 6 diapers in a 24-hour period. ? Not gaining weight by 5 days of age.  Your baby has fewer than 3 stools in a 24-hour period.  Your baby's skin or the white parts of his or her eyes become yellow. Get help right away if:  Your baby is overly tired  (lethargic) and does not want to wake up and feed.  Your baby develops an unexplained fever. Summary  Breastfeeding offers many health benefits for infant and mothers.  Try to breastfeed your infant when he or she shows early signs of hunger.  Gently tickle or stroke your baby's lips with your finger or nipple to allow the baby to open his or her mouth. Bring the baby to your breast. Make sure that much of the areola is in your baby's mouth. Offer one side and burp the baby before you offer the other side.  Talk with your health care provider or lactation consultant if you have questions or you face problems as you breastfeed. This information is not intended to replace advice given to you by your health care provider. Make sure you discuss any questions you have with your health care provider. Document Revised: 06/24/2017 Document Reviewed: 05/01/2016 Elsevier Patient Education  2021 [employer] Inc.  

## 2020-06-18 NOTE — Patient Instructions (Signed)
Correne Lalani  06/18/2020   Your procedure is scheduled on:  06/22/2020  Arrive at 0700 at [employer] [facility] on [address] at [facility]  and [employer]. You are invited to use the FREE valet parking or use the Visitor's parking deck.  Pick up the phone at the desk and dial [phone].  Call this number if you have problems the morning of surgery: [phone]  Remember:   Do not eat food:(After Midnight) Desps de medianoche.  Do not drink clear liquids: (After Midnight) Desps de medianoche.  Take these medicines the morning of surgery with A SIP OF WATER:  Take 27 units of insulin the night before surgery.  No medications the day of surgery   Do not wear jewelry, make-up or nail polish.  Do not wear lotions, powders, or perfumes. Do not wear deodorant.  Do not shave 48 hours prior to surgery.  Do not bring valuables to the hospital.  [facility] is not   responsible for any belongings or valuables brought to the hospital.  Contacts, dentures or bridgework may not be worn into surgery.  Leave suitcase in the car. After surgery it may be brought to your room.  For patients admitted to the hospital, checkout time is 11:00 AM the day of              discharge.      Please read over the following fact sheets that you were given:     Preparing for Surgery

## 2020-06-19 ENCOUNTER — Encounter: Payer: Self-pay | Admitting: Obstetrics and Gynecology

## 2020-06-20 ENCOUNTER — Other Ambulatory Visit: Payer: Self-pay

## 2020-06-20 ENCOUNTER — Encounter (HOSPITAL_COMMUNITY)
Admission: RE | Admit: 2020-06-20 | Discharge: 2020-06-20 | Disposition: A | Discharge status: Home / Self Care | Payer: Medicaid Other | Source: Ambulatory Visit | Attending: Obstetrics & Gynecology | Admitting: Obstetrics & Gynecology

## 2020-06-20 ENCOUNTER — Other Ambulatory Visit (HOSPITAL_COMMUNITY)
Admission: RE | Admit: 2020-06-20 | Discharge: 2020-06-20 | Disposition: A | Discharge status: Home / Self Care | Payer: Medicaid Other | Source: Ambulatory Visit | Attending: Obstetrics & Gynecology | Admitting: Obstetrics & Gynecology

## 2020-06-20 DIAGNOSIS — Z01812 Encounter for preprocedural laboratory examination: Secondary | ICD-10-CM | POA: Insufficient documentation

## 2020-06-20 DIAGNOSIS — Z20822 Contact with and (suspected) exposure to covid-19: Secondary | ICD-10-CM | POA: Diagnosis not present

## 2020-06-20 LAB — CBC
HCT: 33.1 % — ABNORMAL LOW (ref 36.0–46.0)
Hemoglobin: 11.1 g/dL — ABNORMAL LOW (ref 12.0–15.0)
MCH: 26.8 pg (ref 26.0–34.0)
MCHC: 33.5 g/dL (ref 30.0–36.0)
MCV: 80 fL (ref 80.0–100.0)
Platelets: 318 10*3/uL (ref 150–400)
RBC: 4.14 MIL/uL (ref 3.87–5.11)
RDW: 14.4 % (ref 11.5–15.5)
WBC: 7.1 10*3/uL (ref 4.0–10.5)
nRBC: 0 % (ref 0.0–0.2)

## 2020-06-20 LAB — TYPE AND SCREEN
ABO/RH(D): B POS
Antibody Screen: NEGATIVE

## 2020-06-20 LAB — SARS CORONAVIRUS 2 (TAT 6-24 HRS): SARS Coronavirus 2: NEGATIVE

## 2020-06-20 LAB — RPR: RPR Ser Ql: NONREACTIVE

## 2020-06-22 ENCOUNTER — Other Ambulatory Visit: Payer: Self-pay

## 2020-06-22 ENCOUNTER — Inpatient Hospital Stay (HOSPITAL_COMMUNITY): Payer: Medicaid Other | Admitting: Anesthesiology

## 2020-06-22 ENCOUNTER — Encounter (HOSPITAL_COMMUNITY)
Admission: RE | Disposition: A | Discharge status: Home / Self Care | Payer: Self-pay | Source: Home / Self Care | Attending: Obstetrics & Gynecology

## 2020-06-22 ENCOUNTER — Encounter (HOSPITAL_COMMUNITY): Payer: Self-pay | Admitting: Obstetrics & Gynecology

## 2020-06-22 ENCOUNTER — Inpatient Hospital Stay (HOSPITAL_COMMUNITY)
Admission: RE | Admit: 2020-06-22 | Discharge: 2020-06-24 | DRG: 786 | Disposition: A | Discharge status: Home / Self Care | Payer: Medicaid Other | Attending: Obstetrics & Gynecology | Admitting: Obstetrics & Gynecology

## 2020-06-22 DIAGNOSIS — O9902 Anemia complicating childbirth: Secondary | ICD-10-CM | POA: Diagnosis present

## 2020-06-22 DIAGNOSIS — O99214 Obesity complicating childbirth: Secondary | ICD-10-CM | POA: Diagnosis present

## 2020-06-22 DIAGNOSIS — O99824 Streptococcus B carrier state complicating childbirth: Secondary | ICD-10-CM | POA: Diagnosis present

## 2020-06-22 DIAGNOSIS — D573 Sickle-cell trait: Secondary | ICD-10-CM | POA: Diagnosis present

## 2020-06-22 DIAGNOSIS — O24113 Pre-existing diabetes mellitus, type 2, in pregnancy, third trimester: Secondary | ICD-10-CM | POA: Diagnosis present

## 2020-06-22 DIAGNOSIS — Z3A38 38 weeks gestation of pregnancy: Secondary | ICD-10-CM

## 2020-06-22 DIAGNOSIS — O134 Gestational [pregnancy-induced] hypertension without significant proteinuria, complicating childbirth: Secondary | ICD-10-CM | POA: Diagnosis not present

## 2020-06-22 DIAGNOSIS — O09899 Supervision of other high risk pregnancies, unspecified trimester: Secondary | ICD-10-CM

## 2020-06-22 DIAGNOSIS — O2412 Pre-existing diabetes mellitus, type 2, in childbirth: Secondary | ICD-10-CM | POA: Diagnosis present

## 2020-06-22 DIAGNOSIS — O1002 Pre-existing essential hypertension complicating childbirth: Secondary | ICD-10-CM | POA: Diagnosis present

## 2020-06-22 DIAGNOSIS — E119 Type 2 diabetes mellitus without complications: Secondary | ICD-10-CM | POA: Diagnosis present

## 2020-06-22 DIAGNOSIS — Z148 Genetic carrier of other disease: Secondary | ICD-10-CM

## 2020-06-22 DIAGNOSIS — Z794 Long term (current) use of insulin: Secondary | ICD-10-CM

## 2020-06-22 DIAGNOSIS — O99820 Streptococcus B carrier state complicating pregnancy: Secondary | ICD-10-CM

## 2020-06-22 DIAGNOSIS — Z87891 Personal history of nicotine dependence: Secondary | ICD-10-CM

## 2020-06-22 DIAGNOSIS — Z98891 History of uterine scar from previous surgery: Secondary | ICD-10-CM | POA: Diagnosis not present

## 2020-06-22 DIAGNOSIS — O99210 Obesity complicating pregnancy, unspecified trimester: Secondary | ICD-10-CM | POA: Diagnosis present

## 2020-06-22 DIAGNOSIS — O99612 Diseases of the digestive system complicating pregnancy, second trimester: Secondary | ICD-10-CM

## 2020-06-22 DIAGNOSIS — E669 Obesity, unspecified: Secondary | ICD-10-CM | POA: Diagnosis present

## 2020-06-22 DIAGNOSIS — O328XX0 Maternal care for other malpresentation of fetus, not applicable or unspecified: Secondary | ICD-10-CM

## 2020-06-22 DIAGNOSIS — Z6841 Body Mass Index (BMI) 40.0 and over, adult: Secondary | ICD-10-CM

## 2020-06-22 DIAGNOSIS — Z3A37 37 weeks gestation of pregnancy: Secondary | ICD-10-CM

## 2020-06-22 DIAGNOSIS — Z91199 Patient's noncompliance with other medical treatment and regimen due to unspecified reason: Secondary | ICD-10-CM

## 2020-06-22 DIAGNOSIS — O34211 Maternal care for low transverse scar from previous cesarean delivery: Principal | ICD-10-CM | POA: Diagnosis present

## 2020-06-22 DIAGNOSIS — O99215 Obesity complicating the puerperium: Secondary | ICD-10-CM | POA: Diagnosis not present

## 2020-06-22 DIAGNOSIS — O135 Gestational [pregnancy-induced] hypertension without significant proteinuria, complicating the puerperium: Secondary | ICD-10-CM | POA: Diagnosis not present

## 2020-06-22 DIAGNOSIS — O2413 Pre-existing diabetes mellitus, type 2, in the puerperium: Secondary | ICD-10-CM | POA: Diagnosis not present

## 2020-06-22 DIAGNOSIS — Z9119 Patient's noncompliance with other medical treatment and regimen: Secondary | ICD-10-CM

## 2020-06-22 DIAGNOSIS — O10919 Unspecified pre-existing hypertension complicating pregnancy, unspecified trimester: Secondary | ICD-10-CM | POA: Diagnosis present

## 2020-06-22 DIAGNOSIS — K660 Peritoneal adhesions (postprocedural) (postinfection): Secondary | ICD-10-CM | POA: Diagnosis not present

## 2020-06-22 DIAGNOSIS — O24319 Unspecified pre-existing diabetes mellitus in pregnancy, unspecified trimester: Secondary | ICD-10-CM | POA: Diagnosis present

## 2020-06-22 LAB — COMPREHENSIVE METABOLIC PANEL
ALT: 9 U/L (ref 0–44)
AST: 24 U/L (ref 15–41)
Albumin: 2.8 g/dL — ABNORMAL LOW (ref 3.5–5.0)
Alkaline Phosphatase: 108 U/L (ref 38–126)
Anion gap: 8 (ref 5–15)
BUN: 7 mg/dL (ref 6–20)
CO2: 19 mmol/L — ABNORMAL LOW (ref 22–32)
Calcium: 8.8 mg/dL — ABNORMAL LOW (ref 8.9–10.3)
Chloride: 107 mmol/L (ref 98–111)
Creatinine, Ser: 0.62 mg/dL (ref 0.44–1.00)
GFR, Estimated: >60 mL/min (ref >60)
Glucose, Bld: 117 mg/dL — ABNORMAL HIGH (ref 70–99)
Potassium: 4.7 mmol/L (ref 3.5–5.1)
Sodium: 134 mmol/L — ABNORMAL LOW (ref 135–145)
Total Bilirubin: 0.6 mg/dL (ref 0.3–1.2)
Total Protein: 6.3 g/dL — ABNORMAL LOW (ref 6.5–8.1)

## 2020-06-22 LAB — GLUCOSE, CAPILLARY
Glucose-Capillary: 113 mg/dL — ABNORMAL HIGH (ref 70–99)
Glucose-Capillary: 197 mg/dL — ABNORMAL HIGH (ref 70–99)
Glucose-Capillary: 213 mg/dL — ABNORMAL HIGH (ref 70–99)

## 2020-06-22 SURGERY — Surgical Case
Anesthesia: Spinal

## 2020-06-22 MED ORDER — PHENYLEPHRINE HCL (PRESSORS) 10 MG/ML IV SOLN
INTRAVENOUS | Status: DC | PRN
  Administered 2020-06-22 (×6): 40 ug via INTRAVENOUS

## 2020-06-22 MED ORDER — MAGNESIUM SULFATE BOLUS VIA INFUSION
4.0000 g | Freq: Once | INTRAVENOUS | Status: AC
  Administered 2020-06-22: 4 g via INTRAVENOUS
  Filled 2020-06-22: qty 1000

## 2020-06-22 MED ORDER — INSULIN GLARGINE 100 UNIT/ML ~~LOC~~ SOLN
50.0000 [IU] | Freq: Every day | SUBCUTANEOUS | Status: DC
  Administered 2020-06-22: 50 [IU] via SUBCUTANEOUS
  Filled 2020-06-22 (×2): qty 0.5

## 2020-06-22 MED ORDER — INSULIN ASPART 100 UNIT/ML ~~LOC~~ SOLN
0.0000 [IU] | Freq: Every day | SUBCUTANEOUS | Status: DC
  Administered 2020-06-22: 5 [IU] via SUBCUTANEOUS

## 2020-06-22 MED ORDER — PRENATAL MULTIVITAMIN CH
1.0000 | ORAL_TABLET | Freq: Every day | ORAL | Status: DC
  Administered 2020-06-22 – 2020-06-24 (×3): 1 via ORAL
  Filled 2020-06-22 (×4): qty 1

## 2020-06-22 MED ORDER — OXYCODONE HCL 5 MG PO TABS: 5.0000 mg | ORAL_TABLET | Freq: Once | ORAL | Status: DC | PRN

## 2020-06-22 MED ORDER — IBUPROFEN 800 MG PO TABS
800.0000 mg | ORAL_TABLET | Freq: Four times a day (QID) | ORAL | Status: DC
  Administered 2020-06-23 – 2020-06-24 (×5): 800 mg via ORAL
  Filled 2020-06-22 (×5): qty 1

## 2020-06-22 MED ORDER — COCONUT OIL OIL
1.0000 "application " | TOPICAL_OIL | Status: DC | PRN
  Filled 2020-06-22: qty 120

## 2020-06-22 MED ORDER — MEASLES, MUMPS & RUBELLA VAC IJ SOLR: 0.5000 mL | Freq: Once | INTRAMUSCULAR | Status: DC

## 2020-06-22 MED ORDER — LACTATED RINGERS IV SOLN: INTRAVENOUS | Status: DC

## 2020-06-22 MED ORDER — FENTANYL CITRATE (PF) 100 MCG/2ML IJ SOLN
INTRAMUSCULAR | Status: AC
  Filled 2020-06-22: qty 2

## 2020-06-22 MED ORDER — SIMETHICONE 80 MG PO CHEW
80.0000 mg | CHEWABLE_TABLET | Freq: Three times a day (TID) | ORAL | Status: DC
  Administered 2020-06-22 – 2020-06-24 (×6): 80 mg via ORAL
  Filled 2020-06-22 (×7): qty 1

## 2020-06-22 MED ORDER — KETOROLAC TROMETHAMINE 30 MG/ML IJ SOLN
30.0000 mg | Freq: Four times a day (QID) | INTRAMUSCULAR | Status: AC
  Administered 2020-06-22 – 2020-06-23 (×4): 30 mg via INTRAVENOUS
  Filled 2020-06-22 (×4): qty 1

## 2020-06-22 MED ORDER — SIMETHICONE 80 MG PO CHEW
80.0000 mg | CHEWABLE_TABLET | ORAL | Status: DC | PRN
  Filled 2020-06-22: qty 1

## 2020-06-22 MED ORDER — LABETALOL HCL 5 MG/ML IV SOLN: 80.0000 mg | INTRAVENOUS | Status: DC | PRN

## 2020-06-22 MED ORDER — FENTANYL CITRATE (PF) 100 MCG/2ML IJ SOLN
INTRAMUSCULAR | Status: DC | PRN
  Administered 2020-06-22: 15 ug via INTRATHECAL

## 2020-06-22 MED ORDER — BUPIVACAINE HCL (PF) 0.25 % IJ SOLN
INTRAMUSCULAR | Status: AC
  Filled 2020-06-22: qty 30

## 2020-06-22 MED ORDER — SENNOSIDES-DOCUSATE SODIUM 8.6-50 MG PO TABS
2.0000 | ORAL_TABLET | Freq: Every day | ORAL | Status: DC
  Administered 2020-06-23 – 2020-06-24 (×2): 2 via ORAL
  Filled 2020-06-22 (×3): qty 2

## 2020-06-22 MED ORDER — INSULIN ASPART 100 UNIT/ML ~~LOC~~ SOLN
0.0000 [IU] | Freq: Three times a day (TID) | SUBCUTANEOUS | Status: DC
  Administered 2020-06-22 – 2020-06-23 (×4): 3 [IU] via SUBCUTANEOUS

## 2020-06-22 MED ORDER — OXYTOCIN-SODIUM CHLORIDE 30-0.9 UT/500ML-% IV SOLN
INTRAVENOUS | Status: AC
  Filled 2020-06-22: qty 500

## 2020-06-22 MED ORDER — MENTHOL 3 MG MT LOZG
1.0000 | LOZENGE | OROMUCOSAL | Status: DC | PRN
  Filled 2020-06-22: qty 9

## 2020-06-22 MED ORDER — LABETALOL HCL 5 MG/ML IV SOLN: 20.0000 mg | INTRAVENOUS | Status: DC | PRN

## 2020-06-22 MED ORDER — OXYTOCIN-SODIUM CHLORIDE 30-0.9 UT/500ML-% IV SOLN
2.5000 [IU]/h | INTRAVENOUS | Status: DC
  Administered 2020-06-22: 2.5 [IU]/h via INTRAVENOUS
  Filled 2020-06-22: qty 500

## 2020-06-22 MED ORDER — MORPHINE SULFATE (PF) 0.5 MG/ML IJ SOLN
INTRAMUSCULAR | Status: DC | PRN
  Administered 2020-06-22: 150 ug via INTRATHECAL

## 2020-06-22 MED ORDER — ONDANSETRON HCL 4 MG/2ML IJ SOLN: 4.0000 mg | Freq: Once | INTRAMUSCULAR | Status: DC | PRN

## 2020-06-22 MED ORDER — DEXTROSE 5 % IV SOLN
3.0000 g | INTRAVENOUS | Status: AC
  Administered 2020-06-22: 3 g via INTRAVENOUS

## 2020-06-22 MED ORDER — DEXTROSE 5 % IV SOLN
INTRAVENOUS | Status: AC
  Filled 2020-06-22: qty 3000

## 2020-06-22 MED ORDER — ONDANSETRON HCL 4 MG/2ML IJ SOLN
INTRAMUSCULAR | Status: AC
  Filled 2020-06-22: qty 2

## 2020-06-22 MED ORDER — OXYTOCIN-SODIUM CHLORIDE 30-0.9 UT/500ML-% IV SOLN
INTRAVENOUS | Status: DC | PRN
  Administered 2020-06-22: 30 [IU] via INTRAVENOUS

## 2020-06-22 MED ORDER — BUPIVACAINE HCL 0.25 % IJ SOLN
INTRAMUSCULAR | Status: DC | PRN
  Administered 2020-06-22: 30 mL

## 2020-06-22 MED ORDER — LABETALOL HCL 5 MG/ML IV SOLN: 40.0000 mg | INTRAVENOUS | Status: DC | PRN

## 2020-06-22 MED ORDER — PHENYLEPHRINE HCL-NACL 20-0.9 MG/250ML-% IV SOLN
INTRAVENOUS | Status: AC
  Filled 2020-06-22: qty 250

## 2020-06-22 MED ORDER — PHENYLEPHRINE 40 MCG/ML (10ML) SYRINGE FOR IV PUSH (FOR BLOOD PRESSURE SUPPORT)
PREFILLED_SYRINGE | INTRAVENOUS | Status: AC
  Filled 2020-06-22: qty 10

## 2020-06-22 MED ORDER — MORPHINE SULFATE (PF) 0.5 MG/ML IJ SOLN
INTRAMUSCULAR | Status: AC
  Filled 2020-06-22: qty 10

## 2020-06-22 MED ORDER — LABETALOL HCL 5 MG/ML IV SOLN
20.0000 mg | Freq: Once | INTRAVENOUS | Status: AC
  Administered 2020-06-22: 20 mg via INTRAVENOUS

## 2020-06-22 MED ORDER — FENTANYL CITRATE (PF) 100 MCG/2ML IJ SOLN: 25.0000 ug | INTRAMUSCULAR | Status: DC | PRN

## 2020-06-22 MED ORDER — DEXAMETHASONE SODIUM PHOSPHATE 10 MG/ML IJ SOLN
INTRAMUSCULAR | Status: AC
  Filled 2020-06-22: qty 1

## 2020-06-22 MED ORDER — ACETAMINOPHEN 160 MG/5ML PO SOLN: 325.0000 mg | ORAL | Status: DC | PRN

## 2020-06-22 MED ORDER — HYDRALAZINE HCL 10 MG PO TABS: 10.0000 mg | ORAL_TABLET | ORAL | Status: DC | PRN

## 2020-06-22 MED ORDER — HYDRALAZINE HCL 20 MG/ML IJ SOLN: 10.0000 mg | INTRAMUSCULAR | Status: DC | PRN

## 2020-06-22 MED ORDER — DIBUCAINE (PERIANAL) 1 % EX OINT
1.0000 "application " | TOPICAL_OINTMENT | CUTANEOUS | Status: DC | PRN
  Filled 2020-06-22: qty 28

## 2020-06-22 MED ORDER — PHENYLEPHRINE HCL-NACL 20-0.9 MG/250ML-% IV SOLN
INTRAVENOUS | Status: DC | PRN
  Administered 2020-06-22: 60 ug/min via INTRAVENOUS

## 2020-06-22 MED ORDER — WITCH HAZEL-GLYCERIN EX PADS: 1.0000 "application " | MEDICATED_PAD | CUTANEOUS | Status: DC | PRN

## 2020-06-22 MED ORDER — LABETALOL HCL 5 MG/ML IV SOLN
INTRAVENOUS | Status: AC
  Filled 2020-06-22: qty 4

## 2020-06-22 MED ORDER — ENOXAPARIN SODIUM 60 MG/0.6ML ~~LOC~~ SOLN
60.0000 mg | SUBCUTANEOUS | Status: DC
  Administered 2020-06-22 – 2020-06-23 (×2): 60 mg via SUBCUTANEOUS
  Filled 2020-06-22 (×2): qty 0.6

## 2020-06-22 MED ORDER — ONDANSETRON HCL 4 MG/2ML IJ SOLN
INTRAMUSCULAR | Status: DC | PRN
  Administered 2020-06-22: 4 mg via INTRAVENOUS

## 2020-06-22 MED ORDER — ACETAMINOPHEN 325 MG PO TABS: 325.0000 mg | ORAL_TABLET | ORAL | Status: DC | PRN

## 2020-06-22 MED ORDER — OXYCODONE HCL 5 MG/5ML PO SOLN: 5.0000 mg | Freq: Once | ORAL | Status: DC | PRN

## 2020-06-22 MED ORDER — MEPERIDINE HCL 25 MG/ML IJ SOLN: 6.2500 mg | INTRAMUSCULAR | Status: DC | PRN

## 2020-06-22 MED ORDER — BUPIVACAINE IN DEXTROSE 0.75-8.25 % IT SOLN
INTRATHECAL | Status: DC | PRN
  Administered 2020-06-22: 1.4 mL via INTRATHECAL

## 2020-06-22 MED ORDER — POVIDONE-IODINE 10 % EX SWAB
2.0000 "application " | Freq: Once | CUTANEOUS | Status: AC
  Administered 2020-06-22: 2 via TOPICAL

## 2020-06-22 MED ORDER — DIPHENHYDRAMINE HCL 25 MG PO CAPS: 25.0000 mg | ORAL_CAPSULE | Freq: Four times a day (QID) | ORAL | Status: DC | PRN

## 2020-06-22 MED ORDER — TETANUS-DIPHTH-ACELL PERTUSSIS 5-2.5-18.5 LF-MCG/0.5 IM SUSY
0.5000 mL | PREFILLED_SYRINGE | Freq: Once | INTRAMUSCULAR | Status: DC
  Filled 2020-06-22: qty 0.5

## 2020-06-22 MED ORDER — DEXAMETHASONE SODIUM PHOSPHATE 4 MG/ML IJ SOLN
INTRAMUSCULAR | Status: DC | PRN
  Administered 2020-06-22: 4 mg via INTRAVENOUS

## 2020-06-22 MED ORDER — MAGNESIUM SULFATE 40 GM/1000ML IV SOLN
2.0000 g/h | INTRAVENOUS | Status: AC
  Administered 2020-06-22: 2 g/h via INTRAVENOUS
  Filled 2020-06-22 (×2): qty 1000

## 2020-06-22 MED ORDER — AMLODIPINE BESYLATE 5 MG PO TABS
5.0000 mg | ORAL_TABLET | Freq: Every day | ORAL | Status: DC
  Administered 2020-06-22: 5 mg via ORAL
  Filled 2020-06-22: qty 1

## 2020-06-22 SURGICAL SUPPLY — 39 items
APL SKNCLS STERI-STRIP NONHPOA (GAUZE/BANDAGES/DRESSINGS) ×1
BENZOIN TINCTURE PRP APPL 2/3 (GAUZE/BANDAGES/DRESSINGS) ×2 IMPLANT
CLAMP CORD UMBIL (MISCELLANEOUS) ×2 IMPLANT
CLOTH BEACON ORANGE TIMEOUT ST (SAFETY) ×2 IMPLANT
DRESSING PREVENA PLUS CUSTOM (GAUZE/BANDAGES/DRESSINGS) IMPLANT
DRSG OPSITE POSTOP 4X10 (GAUZE/BANDAGES/DRESSINGS) ×2 IMPLANT
DRSG PREVENA PLUS CUSTOM (GAUZE/BANDAGES/DRESSINGS) ×2
ELECT REM PT RETURN 9FT ADLT (ELECTROSURGICAL) ×2
ELECTRODE REM PT RTRN 9FT ADLT (ELECTROSURGICAL) ×1 IMPLANT
EXTENDER TRAXI PANNICULUS (MISCELLANEOUS) IMPLANT
EXTRACTOR VACUUM KIWI (MISCELLANEOUS) ×1 IMPLANT
EXTRACTOR VACUUM M CUP 4 TUBE (SUCTIONS) IMPLANT
GLOVE BIOGEL PI IND STRL 7.0 (GLOVE) ×3 IMPLANT
GLOVE BIOGEL PI INDICATOR 7.0 (GLOVE) ×3
GLOVE ECLIPSE 7.0 STRL STRAW (GLOVE) ×2 IMPLANT
GOWN STRL REUS W/TWL LRG LVL3 (GOWN DISPOSABLE) ×4 IMPLANT
KIT ABG SYR 3ML LUER SLIP (SYRINGE) ×2 IMPLANT
NDL HYPO 25X5/8 SAFETYGLIDE (NEEDLE) ×1 IMPLANT
NEEDLE HYPO 22GX1.5 SAFETY (NEEDLE) ×2 IMPLANT
NEEDLE HYPO 25X5/8 SAFETYGLIDE (NEEDLE) ×2 IMPLANT
NS IRRIG 1000ML POUR BTL (IV SOLUTION) ×2 IMPLANT
PACK C SECTION WH (CUSTOM PROCEDURE TRAY) ×2 IMPLANT
PAD ABD 7.5X8 STRL (GAUZE/BANDAGES/DRESSINGS) ×2 IMPLANT
PAD OB MATERNITY 4.3X12.25 (PERSONAL CARE ITEMS) ×2 IMPLANT
PENCIL SMOKE EVAC W/HOLSTER (ELECTROSURGICAL) ×2 IMPLANT
RETRACTOR TRAXI PANNICULUS (MISCELLANEOUS) IMPLANT
RTRCTR C-SECT PINK 25CM LRG (MISCELLANEOUS) ×2 IMPLANT
STRIP CLOSURE SKIN 1/2X4 (GAUZE/BANDAGES/DRESSINGS) ×2 IMPLANT
SUT MNCRL 0 VIOLET CTX 36 (SUTURE) ×2 IMPLANT
SUT MONOCRYL 0 CTX 36 (SUTURE) ×4
SUT VIC AB 0 CTX 36 (SUTURE) ×2
SUT VIC AB 0 CTX36XBRD ANBCTRL (SUTURE) ×1 IMPLANT
SUT VIC AB 4-0 KS 27 (SUTURE) ×2 IMPLANT
SYR 30ML LL (SYRINGE) ×2 IMPLANT
TOWEL OR 17X24 6PK STRL BLUE (TOWEL DISPOSABLE) ×2 IMPLANT
TRAXI PANNICULUS EXTENDER (MISCELLANEOUS) ×1
TRAXI PANNICULUS RETRACTOR (MISCELLANEOUS) ×1
TRAY FOLEY W/BAG SLVR 14FR LF (SET/KITS/TRAYS/PACK) ×2 IMPLANT
WATER STERILE IRR 1000ML POUR (IV SOLUTION) ×2 IMPLANT

## 2020-06-22 NOTE — Anesthesia Procedure Notes (Signed)
Spinal  Patient location during procedure: OR Start time: 06/22/2020 9:05 AM End time: 06/22/2020 9:10 AM Staffing Anesthesiologist: Bethena Midget, MD Preanesthetic Checklist Completed: patient identified, IV checked, site marked, risks and benefits discussed, surgical consent, monitors and equipment checked, pre-op evaluation and timeout performed Spinal Block Patient position: sitting Prep: DuraPrep Patient monitoring: heart rate, cardiac monitor, continuous pulse ox and blood pressure Approach: midline Location: L3-4 Injection technique: single-shot Needle Needle type: Sprotte  Needle gauge: 24 G Needle length: 9 cm Assessment Sensory level: T4

## 2020-06-22 NOTE — Transfer of Care (Signed)
Immediate Anesthesia Transfer of Care Note  Patient: Andrea [name]  Procedure(s) Performed: CESAREAN SECTION (N/A )  Patient Location: PACU  Anesthesia Type:Spinal  Level of Consciousness: awake, alert  and oriented  Airway & Oxygen Therapy: Patient Spontanous Breathing  Post-op Assessment: Report given to RN and Post -op Vital signs reviewed and stable  Post vital signs: Reviewed and stable  Last Vitals:  Vitals Value Taken Time  BP    Temp    Pulse 80 06/22/20 1113  Resp 21 06/22/20 1113  SpO2 100 % 06/22/20 1113  Vitals shown include unvalidated device data.  Last Pain:  Vitals:   06/22/20 0727  TempSrc: Oral         Complications: No complications documented.

## 2020-06-22 NOTE — H&P (Addendum)
OBSTETRIC ADMISSION HISTORY AND PHYSICAL  Andrea [name] is a 30 y.o. female [phone] with IUP at 44w0dby LMP presenting for scheduled repeat Cesearean. She reports +FMs, No LOF, no VB, no blurry vision, headaches or peripheral edema, and RUQ pain.  She plans on breast feeding. She is undecided for birth control. [facility] [place] [facility] --->  Estimated Date of Delivery: 07/06/20  Sono:  '@[redacted]w[redacted]d' , CWD, normal anatomy, cephalic presentation, [id number] 55% EFW   Prenatal History/Complications:  - TS1UO(insulin in pregnancy) - cHTN (no prior meds) - h/o Cesarean x2 - Sickle cell trait - Obesity in pregnancy (BMI 50) - h/o PPROM at [place](first pregnancy)  Past Medical History: Past Medical History:  Diagnosis Date  . Anemia    as a teenager  . Chicken pox   . Diabetes mellitus    type 2 on insulin  . Impacted teeth with abnormal position    impacted wisdom teeth    Past Surgical History: Past Surgical History:  Procedure Laterality Date  . CESAREAN SECTION N/A 09/16/2015   Procedure: CESAREAN SECTION;  Surgeon: KGuss Bunde MD;  [place] [facility]  Service: Obstetrics;  Laterality: N/A;  . CESAREAN SECTION N/A 02/06/2018   Procedure: CESAREAN SECTION;  Surgeon: EFlorian Buff MD;  [place] [facility]  Service: Obstetrics;  Laterality: N/A;  . FOREIGN BODY REMOVAL Left 08/28/2015   Procedure: REMOVAL FOREIGN BODY foot;  Surgeon: SVickey Huger MD;  [place] [facility]  Service: Orthopedics;  Laterality: Left;  regional block  . NO PAST SURGERIES    . TOOTH EXTRACTION N/A 07/24/2016   Procedure: EXTRACTION WISDOM TEETH ONE, SIXTEEN, SEVENTEEN AND THIRTY-TWO;  Surgeon: SDiona Browner DDS;  [place] [facility]  Service: Oral Surgery;  Laterality: N/A;    Obstetrical History: OB History    Gravida  3   Para  2   Term  1   Preterm  1   AB  0   Living  2     SAB  0   IAB  0   Ectopic  0   Multiple  0   Live Births  2            Social History Social History   Socioeconomic History  . Marital status: Married    Spouse name: Not on file  . Number of children: Not on file  . Years of education: Not on file  . Highest education level: Not on file  Occupational History  . Not on file  Tobacco Use  . Smoking status: Former Smoker    Types: Cigars    Quit date: 11/09/2019    Years since quitting: 0.6  . Smokeless tobacco: Never Used  Vaping Use  . Vaping Use: Never used  Substance and Sexual Activity  . Alcohol use: Not Currently    Comment: rare  . Drug use: No  . Sexual activity: Yes    Birth control/protection: None  Other Topics Concern  . Not on file  Social History Narrative  . Not on file   Social Determinants of Health   Financial Resource Strain: Not on file  Food Insecurity: No Food Insecurity  . Worried About RCharity fundraiserin the Last Year: Never true  . Ran Out of Food in the Last Year: Never true  Transportation Needs: No Transportation Needs  . Lack of Transportation (Medical): No  . Lack of Transportation (Non-Medical): No  Physical Activity: Not on file  Stress: Not on file  Social Connections: Not on file    Family History: Family History  Problem Relation Age of Onset  . Stroke Father   . Hypertension Father   . Diabetes Mellitus II Father   . Irregular heart beat Father   . Heart disease Father   . Hyperlipidemia Father   . Diabetes Mellitus II Mother   . Hyperlipidemia Mother   . Thyroid disease Mother   . Hypertension Maternal Aunt   . Thyroid disease Maternal Aunt   . Diabetes Mellitus II Maternal Grandmother   . Anesthesia problems Neg Hx     Allergies: Allergies  Allergen Reactions  . Metformin And Related Diarrhea    Medications Prior to Admission  Medication Sig Dispense Refill Last Dose  . aspirin EC 81 MG tablet Take 1 tablet (81 mg total) by mouth daily. Swallow whole. 30 tablet 11   . insulin glargine (LANTUS SOLOSTAR) 100  UNIT/ML Solostar Pen Inject 58 Units into the skin daily. (Patient taking differently: Inject 55 Units into the skin at bedtime.) 30 mL 3   . Prenatal Vit-Fe Fumarate-FA (PRENATAL VITAMIN PO) Take 1 tablet by mouth daily.     . Blood Glucose Monitoring Suppl (TRUE METRIX METER) w/Device KIT Use as instructed. Check blood glucose level by fingerstick 2 times  per day. 1 kit 0   . cyclobenzaprine (FLEXERIL) 10 MG tablet Take 1 tablet (10 mg total) by mouth 3 (three) times daily as needed for muscle spasms. (Patient not taking: Reported on 06/18/2020) 30 tablet 0   . glucose blood (TRUE METRIX BLOOD GLUCOSE TEST) test strip Use as instructed. Check blood glucose level by fingerstick twice per day. 100 each 12   . Insulin Pen Needle 31G X 5 MM MISC 1 each by Does not apply route in the morning and at bedtime. For Victoza and Lantus 180 each 1   . TRUEplus Lancets 28G MISC Use as instructed. Check blood glucose level by fingerstick twice per day. 100 each 3   . TRUEPLUS PEN NEEDLES 31G X 8 MM MISC 1 EACH BY DOES NOT APPLY ROUTE IN THE MORNING AND AT BEDTIME. FOR VICTOZA AND LANTUS        Review of Systems   All systems reviewed and negative except as stated in HPI  Height '5\' 2"'  (1.575 m), weight 127.9 kg, last menstrual period 09/30/2019. General appearance: alert, cooperative and appears stated age Lungs: normal WOB Heart: regular rate  Abdomen: soft, non-tender Extremities: no sign of DVT Presentation: cephalic on Leopold's Fetal monitoring: FHT wnl on dopplers    Prenatal labs: ABO, Rh: --/--/B POS (03/10 7510) Antibody: NEG (03/10 0921) Rubella: 0.91 (09/07 1335) RPR: NON REACTIVE (03/10 0905)  HBsAg: Negative (09/07 1335)  HIV: Non Reactive (01/12 1012)  GBS: Positive/-- (03/01 1214)  1 hr Glucola: not performed--h/o T2DM on insulin Genetic screening wnl except for maternal sickle cell trait Anatomy US wnl  Prenatal Transfer Tool  Maternal Diabetes: Yes:  Diabetes Type:   Insulin/Medication controlled Genetic Screening: Normal except for maternal sickle cell trait Maternal Ultrasounds/Referrals: Normal Fetal Ultrasounds or other Referrals:  Fetal echo, Referred to Materal Fetal Medicine  Maternal Substance Abuse:  No Significant Maternal Medications: insulin Significant Maternal Lab Results: Group B Strep positive  No results found for this or any previous visit (from the past 24 hour(s)).  Patient Active Problem List   Diagnosis Date Noted  . Type 2 diabetes mellitus in pregnancy,  third trimester 06/22/2020  . 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 Non-compliance 04/24/2020  . Chronic hypertension during pregnancy, antepartum 03/13/2020  . Supervision of high risk pregnancy, antepartum 12/12/2019  . Preexisting diabetes complicating pregnancy, antepartum 12/12/2019  . GBS (group B Streptococcus carrier), +RV culture, currently pregnant 02/05/2018  . Obesity in pregnancy 08/23/2017  . History of preterm delivery, currently pregnant 07/26/2017  . Healthcare maintenance 06/01/2016  . BMI 40.0-44.9, adult ([place]) 02/25/2016  . History of cesarean section 09/17/2015    Assessment/Plan:  Andrea [name] is a 30 y.o. [id number] at 1w0dhere for scheduled repeat Cesarean.  #Scheduled Repeat Cesarean  H/o Cesarean x2:  The risks of cesarean section were discussed with the patient including but were not limited to: bleeding which may require transfusion or reoperation; infection which may require antibiotics; injury to bowel, bladder, ureters or other surrounding organs; injury to the fetus; need for additional procedures including hysterectomy in the event of a life-threatening hemorrhage; placental abnormalities wth subsequent pregnancies, incisional problems, thromboembolic phenomenon and other postoperative/anesthesia complications. The patient concurred with the proposed plan, giving informed written consent for the procedure.  Patient has been NPO since yesterday; she  will remain NPO for procedure. Anesthesia and OR aware.  Preoperative prophylactic antibiotics and SCDs ordered on call to the OR.  To OR when ready.  #Pain: Per anesthesia #FWB: FHT wnl on dopplers #ID: GBS+; ancef prior to OR #MOF: breast #MOC: undecided #Circ: declines #T2DM: A1c 8% early in pregnancy. Continued on lantus 55u daily in pregnancy. Will plan to monitor BG levels s/p delivery to titrate postpartum insulin regimen prior to discharge. #cHTN: BP elevated in mild range on admission. Asymptomatic. F/u PreE labs on admission. Plan for 1 week BP check in clinic.  GRanda Ngo MD OB Fellow, Faculty [facility] 06/22/2020 7:51 AM

## 2020-06-22 NOTE — Progress Notes (Signed)
John cresenzo notified of patients BP of 160/91.  Orders for mag and transfer will be placed.

## 2020-06-22 NOTE — Anesthesia Preprocedure Evaluation (Signed)
Anesthesia Evaluation  Patient identified by MRN, date of birth, ID band Patient awake    Reviewed: Allergy & Precautions, H&P , NPO status , Patient's Chart, lab work & pertinent test results, reviewed documented beta blocker date and time   History of Anesthesia Complications Negative for: history of anesthetic complications  Airway Mallampati: II  TM Distance: >3 FB Neck ROM: full    Dental no notable dental hx. (+) Teeth Intact, Dental Advisory Given   Pulmonary neg pulmonary ROS, former smoker,    Pulmonary exam normal breath sounds clear to auscultation       Cardiovascular hypertension, Normal cardiovascular exam Rhythm:regular Rate:Normal     Neuro/Psych negative neurological ROS  negative psych ROS   GI/Hepatic negative GI ROS, Neg liver ROS,   Endo/Other  diabetes, Type 2, Insulin DependentBMI 50  Renal/GU negative Renal ROS  negative genitourinary   Musculoskeletal negative musculoskeletal ROS (+)   Abdominal (+) + obese,   Peds negative pediatric ROS (+)  Hematology  (+) Blood dyscrasia, anemia ,   Anesthesia Other Findings   Reproductive/Obstetrics (+) Pregnancy                             Anesthesia Physical  Anesthesia Plan  ASA: III  Anesthesia Plan: Spinal   Post-op Pain Management:    Induction:   PONV Risk Score and Plan: 2 and Treatment may vary due to age or medical condition and Scopolamine patch - Pre-op  Airway Management Planned: Natural Airway  Additional Equipment: None  Intra-op Plan:   Post-operative Plan:   Informed Consent: I have reviewed the patients History and Physical, chart, labs and discussed the procedure including the risks, benefits and alternatives for the proposed anesthesia with the patient or authorized representative who has indicated his/her understanding and acceptance.       Plan Discussed with: CRNA and  Anesthesiologist  Anesthesia Plan Comments:         Anesthesia Quick Evaluation

## 2020-06-22 NOTE — Discharge Instructions (Signed)

## 2020-06-22 NOTE — Progress Notes (Signed)
Inpatient Diabetes Program Recommendations  AACE/ADA: New Consensus Statement on Inpatient Glycemic Control (2015)  Target Ranges:  Prepandial:   less than 140 mg/dL      Peak postprandial:   less than 180 mg/dL (1-2 hours)      Critically ill patients:  140 - 180 mg/dL   Lab Results  Component Value Date   GLUCAP 113 (H) 06/22/2020   HGBA1C 6.7 (H) 04/24/2020    Review of Glycemic Control  Inpatient Diabetes Program Recommendations:   Received consult regarding diabetes management. Patient diagnosed with DM2 and has taken insulin during pregnancy. Consider CBGs with Novolog 0-9 units q 4 hrs. X 24 hrs. Pt will probable not require insulin @ discharge.  Thank you, Billy Fischer. Jevaun Strick, RN, MSN, CDE  Diabetes Coordinator Inpatient Glycemic Control Team Team Pager [phone] (8am-5pm) 06/22/2020 10:17 AM

## 2020-06-22 NOTE — Plan of Care (Signed)
  Problem: Education: Goal: Knowledge of General Education information will improve Description: Including pain rating scale, medication(s)/side effects and non-pharmacologic comfort measures Outcome: Progressing   Problem: Health Behavior/Discharge Planning: Goal: Ability to manage health-related needs will improve Outcome: Progressing   Problem: Clinical Measurements: Goal: Ability to maintain clinical measurements within normal limits will improve Outcome: Progressing Goal: Will remain free from infection Outcome: Progressing Goal: Diagnostic test results will improve Outcome: Progressing Goal: Respiratory complications will improve Outcome: Progressing Goal: Cardiovascular complication will be avoided Outcome: Progressing   Problem: Activity: Goal: Risk for activity intolerance will decrease Outcome: Progressing   Problem: Nutrition: Goal: Adequate nutrition will be maintained Outcome: Progressing   Problem: Coping: Goal: Level of anxiety will decrease Outcome: Progressing   Problem: Elimination: Goal: Will not experience complications related to bowel motility Outcome: Progressing Goal: Will not experience complications related to urinary retention Outcome: Progressing   Problem: Pain Managment: Goal: General experience of comfort will improve Outcome: Progressing   Problem: Safety: Goal: Ability to remain free from injury will improve Outcome: Progressing   Problem: Skin Integrity: Goal: Risk for impaired skin integrity will decrease Outcome: Progressing   Problem: Education: Goal: Knowledge of disease or condition will improve Outcome: Progressing Goal: Knowledge of the prescribed therapeutic regimen will improve Outcome: Progressing   Problem: Fluid Volume: Goal: Peripheral tissue perfusion will improve Outcome: Progressing   Problem: Clinical Measurements: Goal: Complications related to disease process, condition or treatment will be avoided or  minimized Outcome: Progressing   Problem: Education: Goal: Ability to describe self-care measures that may prevent or decrease complications (Diabetes Survival Skills Education) will improve Outcome: Progressing Goal: Individualized Educational Video(s) Outcome: Progressing   Problem: Coping: Goal: Ability to adjust to condition or change in health will improve Outcome: Progressing   Problem: Fluid Volume: Goal: Ability to maintain a balanced intake and output will improve Outcome: Progressing   Problem: Health Behavior/Discharge Planning: Goal: Ability to identify and utilize available resources and services will improve Outcome: Progressing Goal: Ability to manage health-related needs will improve Outcome: Progressing   Problem: Metabolic: Goal: Ability to maintain appropriate glucose levels will improve Outcome: Progressing   Problem: Nutritional: Goal: Maintenance of adequate nutrition will improve Outcome: Progressing Goal: Progress toward achieving an optimal weight will improve Outcome: Progressing   Problem: Skin Integrity: Goal: Risk for impaired skin integrity will decrease Outcome: Progressing   Problem: Tissue Perfusion: Goal: Adequacy of tissue perfusion will improve Outcome: Progressing

## 2020-06-22 NOTE — Anesthesia Postprocedure Evaluation (Signed)
Anesthesia Post Note  Patient: Andrea [name]  Procedure(s) Performed: CESAREAN SECTION (N/A )     Patient location during evaluation: PACU Anesthesia Type: Spinal Level of consciousness: oriented and awake and alert Pain management: pain level controlled Vital Signs Assessment: post-procedure vital signs reviewed and stable Respiratory status: spontaneous breathing, respiratory function stable and patient connected to nasal cannula oxygen Cardiovascular status: blood pressure returned to baseline and stable Postop Assessment: no headache, no backache and no apparent nausea or vomiting Anesthetic complications: no   No complications documented.  Last Vitals:  Vitals:   06/22/20 1115 06/22/20 1130  BP: (!) 148/96 (!) 138/92  Pulse: 80 78  Resp: (!) 22 16  Temp:    SpO2: 100% 100%    Last Pain:  Vitals:   06/22/20 1130  TempSrc:   PainSc: 0-No pain                 ODDONO,ERNEST

## 2020-06-22 NOTE — Discharge Summary (Signed)
Postpartum Discharge Summary  Date of Service updated    Patient Name: Andrea [name] DOB: [birth year] MRN: [id number]  Date of admission: 06/22/2020 Delivery date:06/22/2020  Delivering provider: Donnamae Jude  Date of discharge: 06/24/20  Admitting diagnosis: Type 2 diabetes mellitus in pregnancy, third trimester [O24.113] Intrauterine pregnancy: [redacted]w[redacted]d    Secondary diagnosis:  Principal Problem:   Cesarean delivery delivered Active Problems:   History of cesarean section   BMI 40.0-44.9, [place]   History of preterm delivery, currently pregnant   Obesity in pregnancy   GBS (group B Streptococcus carrier), +RV culture, currently pregnant   Preexisting diabetes complicating pregnancy, antepartum   Chronic hypertension during pregnancy, antepartum   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 yo [id number] admitted to the hospital 06/22/2020 for scheduled cesarean section with the following indication: h/o Cesarean x2.Delivery details are as follows:  Membrane Rupture Time/Date: 9:53 AM ,06/22/2020   Delivery Method:C-Section, Low Transverse  Details of operation can be found in separate operative note. Of note, pt with dense adhesions; recommended for vertical skin incision if future pregnancy. Patient had an uncomplicated postpartum course.  She is ambulating, tolerating a regular diet, passing flatus, and urinating well. For diabetes and chronic hypertension pt was discharged on 3/14. Patient is discharged home in stable condition on  06/22/20        Newborn Data: Birth date:06/22/2020  Birth time:9:55 AM  Gender:Female  Living status:Living  Apgars:9 ,9  Weight:     Magnesium Sulfate received:  Yes: Seizure prophylaxis BMZ received: No Rhophylac:N/A MMQQI:WLNLGXQrior to discharge T-DaP:Given prenatally Flu: offered prior to discharge Transfusion:No  Physical exam  Vitals:   06/22/20 0810 06/22/20 0830 06/22/20 0845 06/22/20 0855  BP: (!) 158/101 (!) 161/108 (!) 166/94 (!) 160/92  Pulse: 87 85 81 83  Resp: '18 18 18 18  ' Temp:      TempSrc:      SpO2:      Weight:      Height:       General: alert, cooperative and no distress Lochia: appropriate Uterine Fundus: firm Incision: Healing well with no significant drainage DVT Evaluation: No evidence of DVT seen on physical exam. Labs: Lab Results  Component Value Date   WBC 7.1 06/20/2020   HGB 11.1 (L) 06/20/2020   HCT 33.1 (L) 06/20/2020   MCV 80.0 06/20/2020   PLT 318 06/20/2020   CMP Latest Ref Rng & Units 06/22/2020  Glucose 70 - 99 mg/dL 117(H)  BUN 6 - 20 mg/dL 7  Creatinine 0.44 - 1.00 mg/dL 0.62  Sodium 135 - 145 mmol/L 134(L)  Potassium 3.5 - 5.1 mmol/L 4.7  Chloride 98 - 111 mmol/L 107  CO2 22 - 32 mmol/L 19(L)  Calcium 8.9 - 10.3 mg/dL 8.8(L)  Total Protein 6.5 - 8.1 g/dL 6.3(L)  Total Bilirubin 0.3 - 1.2 mg/dL 0.6  Alkaline Phos 38 - 126 U/L 108  AST 15 - 41 U/L 24  ALT 0 - 44 U/L 9   Edinburgh Score: Edinburgh Postnatal Depression Scale Screening Tool 02/06/2018  I have been able to laugh and see the funny  side of things. 0  I have looked forward with enjoyment to things. 0  I have blamed myself unnecessarily when things went wrong. 0  I have been anxious or worried for no good reason. 0  I have felt scared or panicky for no good reason. 0  Things have been getting on top of me. 0  I have been so unhappy that I have had difficulty sleeping. 0  I have felt sad or miserable. 0  I have been so unhappy that I have been crying. 0  The thought of harming myself has occurred to me. 0  Edinburgh Postnatal Depression Scale Total 0     After visit meds:  Allergies as of 06/24/2020      Reactions    Metformin And Related Diarrhea      Medication List    TAKE these medications   amLODipine 10 MG tablet Commonly known as: NORVASC Take 1 tablet (10 mg total) by mouth daily.   aspirin EC 81 MG tablet Take 1 tablet (81 mg total) by mouth daily. Swallow whole.   cyclobenzaprine 10 MG tablet Commonly known as: FLEXERIL Take 1 tablet (10 mg total) by mouth 3 (three) times daily as needed for muscle spasms.   furosemide 40 MG tablet Commonly known as: LASIX Take 1 tablet (40 mg total) by mouth daily.   ibuprofen 800 MG tablet Commonly known as: ADVIL Take 1 tablet (800 mg total) by mouth every 6 (six) hours.   insulin aspart 100 UNIT/ML injection Commonly known as: novoLOG Inject 0-5 Units into the skin at bedtime.   insulin aspart 100 UNIT/ML injection Commonly known as: novoLOG Inject 0-15 Units into the skin 3 (three) times daily with meals.   Insulin Pen Needle 31G X 5 MM Misc 1 each by Does not apply route in the morning and at bedtime. For Victoza and Lantus   TRUEplus Pen Needles 31G X 8 MM Misc Generic drug: Insulin Pen Needle 1 EACH BY DOES NOT APPLY ROUTE IN THE MORNING AND AT BEDTIME. FOR VICTOZA AND LANTUS   Lantus SoloStar 100 UNIT/ML Solostar Pen Generic drug: insulin glargine Inject 58 Units into the skin daily. What changed:   how much to take  when to take this   oxyCODONE 5 MG immediate release tablet Commonly known as: Roxicodone Take 1 tablet (5 mg total) by mouth every 4 (four) hours as needed for severe pain.   PRENATAL VITAMIN PO Take 1 tablet by mouth daily.   True Metrix Blood Glucose Test test strip Generic drug: glucose blood Use as instructed. Check blood glucose level by fingerstick twice per day.   True Metrix Meter w/Device Kit Use as instructed. Check blood glucose level by fingerstick 2 times  per day.   TRUEplus Lancets 28G Misc Use as instructed. Check blood glucose level by fingerstick twice per day.         Discharge home in stable condition Infant Feeding: Breast Infant Disposition:home with mother Discharge instruction: per After Visit Summary and Postpartum booklet. Activity: Advance as tolerated. Pelvic rest for 6 weeks.  Diet: routine diet Future Appointments:No future appointments. Follow up Visit: Message sent to [facility] by Dr. Astrid Drafts to schedule postpartum appt.   Please schedule this patient for a In person postpartum visit in 6 weeks with the following provider: MD. Additional Postpartum F/U:Incision check 1 week and BP check 1 week  High risk pregnancy complicated by: now h/o Cesarean x3 (dense adhesions), T2DM (insulin), cHTN, sickle cell trait Delivery  mode:  C-Section, Low Transverse  Anticipated Birth Control:  Unsure   Randa Ngo, MD 06/24/2020 8:29 AM

## 2020-06-22 NOTE — Op Note (Signed)
Preoperative Diagnosis:  IUP @ [redacted]w[redacted]d, history of Cesarean x2  Postoperative Diagnosis:  Vacuum-assisted Repeat Cesarean delivery delivered, Dense Adhesions  Procedure: Repeat low transverse cesarean section  Surgeon: Tinnie Gens, M.D.  Assistant: Lynnda Shields, MD  Findings: Viable female infant, APGAR (1 MIN): 9   APGAR (5 MINS): 9   Weight per medical record, vertex presentation  Estimated blood loss: 307 cc Urine Output: 200 ml clear urine IVF: 1000 ml crystalloid  Complications: None known  Specimens: Placenta to labor and delivery  Reason for procedure: Briefly, the patient is a 30 y.o. Y4M2500 [redacted]w[redacted]d who presents for repeat Cesarean in the setting of 2 prior Cesarean deliveries, cHTN and T2DM on insulin.  Procedure: Patient is a to the OR where spinal analgesia was administered. She was then placed in a supine position with left lateral tilt. She received 3 g of Ancef and SCDs were in place. A timeout was performed. She was prepped and draped in the usual sterile fashion. A Foley catheter was placed in the bladder. A scalpel was then used to make a Pfannenstiel incision. This incision was carried out to underlying fascia with use of scalpel and cautery given dense adhesions. The incision was extended laterally, sharply. Dense adhesions were incised superiorly and inferiorly using mayos and cautery. Rectus muscles were then incised with use of cautery given dense adhesions. The peritoneal cavity was then entered bluntly. Given high position of bladder, bladder flap was created sharply and bluntly prior to insertion of Alexis retractor. Uterine window was noted. A scalpel was used to make a low transverse incision on the uterus. This incision was carried down to the amniotic cavity was entered. Clear amniotic fluid noted. Fetus was in vertex position and was brought up out of the incision with use of Kiwi vacuum. Cord was clamped x 2 and cut. Infant taken to waiting nurse.  Cord blood was  obtained. Placenta was delivered from the uterus.  Uterus was cleaned with dry lap pads. Uterine incision closed with 0 Monocryl suture in a locked running fashion. A second layer of 0 Monocryl in an imbricating fashion was used to achieve hemostasis. One figure-of-eight suture was also placed to assist with hemostasis. Alexis retractor was removed from the abdomen. Unable to close peritoneum given dense adhesions. Rectus muscles were re-approximated with use of 0 vicryl suture.  Fascia is closed with 0 Vicryl suture in a running fashion. Subcutaneous tissue infused with 30cc 0.25% Marcaine.  Subcutaneous closure was performed with 0 plain suture.  Skin closed using 3-0 Vicryl on a Keith needle.  Provena wound vac was applied given risk factors of obesity and T2DM.  All instrument, needle and lap counts were correct x 2.  Patient was awake and taken to PACU stable.  Infant remained with mom in couplet care, stable.   Given dense adhesions along lower uterine segment, Dr. Shawnie Pons discussed with pt need for vertical skin incision if need for subsequent Cesarean.  [name], MD OB Fellow, [facility] 06/22/2020 11:12 AM

## 2020-06-23 DIAGNOSIS — O135 Gestational [pregnancy-induced] hypertension without significant proteinuria, complicating the puerperium: Secondary | ICD-10-CM

## 2020-06-23 DIAGNOSIS — Z98891 History of uterine scar from previous surgery: Secondary | ICD-10-CM

## 2020-06-23 DIAGNOSIS — O2413 Pre-existing diabetes mellitus, type 2, in the puerperium: Secondary | ICD-10-CM

## 2020-06-23 LAB — CBC
HCT: 28.6 % — ABNORMAL LOW (ref 36.0–46.0)
Hemoglobin: 9.6 g/dL — ABNORMAL LOW (ref 12.0–15.0)
MCH: 26.8 pg (ref 26.0–34.0)
MCHC: 33.6 g/dL (ref 30.0–36.0)
MCV: 79.9 fL — ABNORMAL LOW (ref 80.0–100.0)
Platelets: 298 10*3/uL (ref 150–400)
RBC: 3.58 MIL/uL — ABNORMAL LOW (ref 3.87–5.11)
RDW: 14.6 % (ref 11.5–15.5)
WBC: 11.1 10*3/uL — ABNORMAL HIGH (ref 4.0–10.5)
nRBC: 0 % (ref 0.0–0.2)

## 2020-06-23 LAB — GLUCOSE, CAPILLARY
Glucose-Capillary: 180 mg/dL — ABNORMAL HIGH (ref 70–99)
Glucose-Capillary: 199 mg/dL — ABNORMAL HIGH (ref 70–99)
Glucose-Capillary: 176 mg/dL — ABNORMAL HIGH (ref 70–99)
Glucose-Capillary: 182 mg/dL — ABNORMAL HIGH (ref 70–99)
Glucose-Capillary: 189 mg/dL — ABNORMAL HIGH (ref 70–99)

## 2020-06-23 MED ORDER — FUROSEMIDE 10 MG/ML IJ SOLN: 20.0000 mg | Freq: Every day | INTRAMUSCULAR | Status: DC

## 2020-06-23 MED ORDER — AMLODIPINE BESYLATE 10 MG PO TABS
10.0000 mg | ORAL_TABLET | Freq: Every day | ORAL | Status: DC
  Administered 2020-06-23 – 2020-06-24 (×2): 10 mg via ORAL
  Filled 2020-06-23 (×2): qty 1

## 2020-06-23 MED ORDER — VITAMIN C 250 MG PO TABS
250.0000 mg | ORAL_TABLET | ORAL | Status: DC
  Administered 2020-06-23: 250 mg via ORAL
  Filled 2020-06-23: qty 1

## 2020-06-23 MED ORDER — FUROSEMIDE 40 MG PO TABS
40.0000 mg | ORAL_TABLET | Freq: Every day | ORAL | Status: DC
  Administered 2020-06-23 – 2020-06-24 (×2): 40 mg via ORAL
  Filled 2020-06-23 (×2): qty 1

## 2020-06-23 MED ORDER — FERROUS SULFATE 325 (65 FE) MG PO TABS
325.0000 mg | ORAL_TABLET | ORAL | Status: DC
  Administered 2020-06-23: 325 mg via ORAL
  Filled 2020-06-23: qty 1

## 2020-06-23 MED ORDER — INSULIN GLARGINE 100 UNIT/ML ~~LOC~~ SOLN
55.0000 [IU] | Freq: Every day | SUBCUTANEOUS | Status: DC
  Administered 2020-06-23: 55 [IU] via SUBCUTANEOUS
  Filled 2020-06-23 (×2): qty 0.55

## 2020-06-23 NOTE — Progress Notes (Signed)
Subjective: Postpartum Day 1: Cesarean Delivery Patient reports tolerating PO and no problems voiding.    Objective: Vital signs in last 24 hours: Temp:  [97.5 F (36.4 C)-98.2 F (36.8 C)] 98 F (36.7 C) (03/13 1135) Pulse Rate:  [71-95] 91 (03/13 1135) Resp:  [16-20] 18 (03/13 1135) BP: (124-160)/(64-94) 142/78 (03/13 1135) SpO2:  [95 %-100 %] 99 % (03/13 1135)  Intake/Output Summary (Last 24 hours) at 06/23/2020 1349 Last data filed at 06/23/2020 1200 Gross per 24 hour  Intake 3990.52 ml  Output 3650 ml  Net 340.52 ml    Physical Exam:  General: alert, cooperative and appears stated age Lochia: appropriate Uterine Fundus: firm Incision: no significant drainage DVT Evaluation: No evidence of DVT seen on physical exam.  Recent Labs    06/23/20 0407  HGB 9.6*  HCT 28.6*   Glucose-Capillary 176High  199High  180High 213High 197High    Assessment/Plan: Status post Cesarean section. Doing well postoperatively.  Hgb is stable Breast feeding BP up--will increase CCB to 10 mg daily Added IV lasix 20 mg daily as she is net + on fluids. Increased Lantus to 55u q hs as CBGs are still high--has SSI Continue current care.  [name] 06/23/2020, 1:49 PM

## 2020-06-24 DIAGNOSIS — O99215 Obesity complicating the puerperium: Secondary | ICD-10-CM

## 2020-06-24 DIAGNOSIS — E669 Obesity, unspecified: Secondary | ICD-10-CM

## 2020-06-24 DIAGNOSIS — O10013 Pre-existing essential hypertension complicating pregnancy, third trimester: Secondary | ICD-10-CM

## 2020-06-24 DIAGNOSIS — O30043 Twin pregnancy, dichorionic/diamniotic, third trimester: Secondary | ICD-10-CM

## 2020-06-24 DIAGNOSIS — Z3A38 38 weeks gestation of pregnancy: Secondary | ICD-10-CM

## 2020-06-24 DIAGNOSIS — O321XX0 Maternal care for breech presentation, not applicable or unspecified: Secondary | ICD-10-CM

## 2020-06-24 LAB — GLUCOSE, CAPILLARY
Glucose-Capillary: 61 mg/dL — ABNORMAL LOW (ref 70–99)
Glucose-Capillary: 65 mg/dL — ABNORMAL LOW (ref 70–99)
Glucose-Capillary: 95 mg/dL (ref 70–99)

## 2020-06-24 MED ORDER — HYDROCHLOROTHIAZIDE 25 MG PO TABS: 25.0000 mg | ORAL_TABLET | Freq: Every day | ORAL | Status: DC

## 2020-06-24 MED ORDER — INSULIN ASPART 100 UNIT/ML ~~LOC~~ SOLN: 0.0000 [IU] | Freq: Three times a day (TID) | SUBCUTANEOUS | 11 refills | Status: DC

## 2020-06-24 MED ORDER — IBUPROFEN 800 MG PO TABS: 800.0000 mg | ORAL_TABLET | Freq: Four times a day (QID) | ORAL | 0 refills | Status: DC

## 2020-06-24 MED ORDER — AMLODIPINE BESYLATE 10 MG PO TABS: 10.0000 mg | ORAL_TABLET | Freq: Every day | ORAL | 1 refills | Status: DC

## 2020-06-24 MED ORDER — OXYCODONE HCL 5 MG PO TABS: 5.0000 mg | ORAL_TABLET | ORAL | 0 refills | Status: DC | PRN

## 2020-06-24 MED ORDER — INSULIN ASPART 100 UNIT/ML ~~LOC~~ SOLN: 0.0000 [IU] | Freq: Every day | SUBCUTANEOUS | 11 refills | Status: DC

## 2020-06-24 MED ORDER — FUROSEMIDE 40 MG PO TABS: 40.0000 mg | ORAL_TABLET | Freq: Every day | ORAL | 0 refills | Status: AC

## 2020-06-24 NOTE — Progress Notes (Signed)
Hypoglycemic Event  CBG: 61  Treatment: Juice given and breakfast  Symptoms: No symptoms  Follow-up CBG: Time:7:02 AM CBG Result: 65   Comments/MD notified:Dr. Despina Hidden  Hand-off to Day-shift nurse given. CBG will be obtained again at 7:20.    Nicholaus Bloom

## 2020-06-24 NOTE — Progress Notes (Signed)
Discharge instructions and prescriptions given to pt. Discussed post c-section care, signs and symptoms to report to the MD, upcoming appointments, and meds. Pt verbalizes understanding and has no questions at this time. Pt discharged home from hospital with baby in stable condition.

## 2020-06-25 ENCOUNTER — Telehealth: Payer: Self-pay | Admitting: *Deleted

## 2020-06-25 ENCOUNTER — Ambulatory Visit: Payer: BC Managed Care – PPO

## 2020-06-25 ENCOUNTER — Encounter: Payer: BC Managed Care – PPO | Admitting: Family Medicine

## 2020-06-25 NOTE — Telephone Encounter (Signed)
Transition Care Management Unsuccessful Follow-up Telephone Call  Date of discharge and from where:  06/24/2020 - [facility]  Attempts:  1st Attempt  Reason for unsuccessful TCM follow-up call:  Voice mail full

## 2020-06-26 NOTE — Telephone Encounter (Signed)
Transition Care Management Unsuccessful Follow-up Telephone Call  [facility]  Attempts:  2nd Attempt  Reason for unsuccessful TCM follow-up call:  Voice mail full

## 2020-06-27 ENCOUNTER — Other Ambulatory Visit (HOSPITAL_COMMUNITY): Payer: BC Managed Care – PPO

## 2020-06-27 ENCOUNTER — Encounter (HOSPITAL_COMMUNITY): Admission: RE | Admit: 2020-06-27 | Payer: BC Managed Care – PPO | Source: Ambulatory Visit

## 2020-06-27 NOTE — Telephone Encounter (Signed)
Transition Care Management Follow-up Telephone Call  Date of discharge and from where: 06/24/2020 from [facility]  How have you been since you were released from the hospital? Pt is feeling well and has no questions or concerns at this time.    Any questions or concerns? No  Items Reviewed:  Did the pt receive and understand the discharge instructions provided? Yes   Medications obtained and verified? Yes   Other? No   Any new allergies since your discharge? No   Dietary orders reviewed? n/a  Do you have support at home? Yes   Functional Questionnaire: (I = Independent and D = Dependent) ADLs: I  Bathing/Dressing- I  Meal Prep- I  Eating- I  Maintaining continence- I  Transferring/Ambulation- I  Managing Meds- I   Follow up appointments reviewed:   [facility] f/u appt confirmed? Yes  Pt is calling to schedule 1 week incision and BP check with OB.  Are transportation arrangements needed? No   If their condition worsens, is the pt aware to call PCP or go to the Emergency Dept.? Yes  Was the patient provided with contact information for the PCP's office or ED? Yes  Was to pt encouraged to call back with questions or concerns? Yes

## 2020-07-02 ENCOUNTER — Ambulatory Visit: Payer: BC Managed Care – PPO

## 2020-07-02 ENCOUNTER — Encounter: Payer: BC Managed Care – PPO | Admitting: Family Medicine

## 2020-07-15 ENCOUNTER — Other Ambulatory Visit (HOSPITAL_COMMUNITY): Payer: Self-pay

## 2020-07-18 ENCOUNTER — Telehealth: Payer: Self-pay

## 2020-07-18 ENCOUNTER — Other Ambulatory Visit: Payer: Self-pay

## 2020-07-18 MED FILL — Insulin Glargine Soln Pen-Injector 100 Unit/ML: SUBCUTANEOUS | 30 days supply | Qty: 18 | Fill #0 | Status: CN

## 2020-07-18 NOTE — Telephone Encounter (Signed)
Pt is needing refill on insulin she has upcoming appointment. Sent to [facility].

## 2020-07-19 ENCOUNTER — Other Ambulatory Visit: Payer: Self-pay

## 2020-07-19 MED ORDER — BASAGLAR KWIKPEN 100 UNIT/ML ~~LOC~~ SOPN
58.0000 [IU] | PEN_INJECTOR | Freq: Every day | SUBCUTANEOUS | 0 refills | Status: DC
  Filled 2020-07-19: qty 15, 25d supply, fill #0

## 2020-07-19 NOTE — Telephone Encounter (Signed)
Vm was left for patient informing her that refill has been submitted.

## 2020-07-19 NOTE — Telephone Encounter (Signed)
She has Novolog refills available at [facility] in [place]. She will need to inform [facility] so we can call the [facility] and have this transferred. I sent enough Basaglar to cover until next month's appointment.

## 2020-07-30 ENCOUNTER — Other Ambulatory Visit: Payer: Self-pay

## 2020-07-31 ENCOUNTER — Ambulatory Visit: Payer: Self-pay | Admitting: Family Medicine

## 2020-08-29 ENCOUNTER — Other Ambulatory Visit: Payer: Self-pay

## 2020-08-29 ENCOUNTER — Other Ambulatory Visit: Payer: Self-pay | Admitting: Family Medicine

## 2020-08-29 MED ORDER — BASAGLAR KWIKPEN 100 UNIT/ML ~~LOC~~ SOPN
58.0000 [IU] | PEN_INJECTOR | Freq: Every day | SUBCUTANEOUS | 0 refills | Status: DC
  Filled 2020-08-29: qty 15, 25d supply, fill #0

## 2020-09-02 ENCOUNTER — Other Ambulatory Visit: Payer: Self-pay

## 2020-09-02 ENCOUNTER — Encounter: Payer: Self-pay | Admitting: Family Medicine

## 2020-09-02 ENCOUNTER — Ambulatory Visit: Payer: Medicaid Other | Attending: Family Medicine | Admitting: Family Medicine

## 2020-09-02 VITALS — BP 161/94 | HR 75 | Ht 62.0 in | Wt 273.6 lb

## 2020-09-02 DIAGNOSIS — E1169 Type 2 diabetes mellitus with other specified complication: Secondary | ICD-10-CM | POA: Diagnosis not present

## 2020-09-02 DIAGNOSIS — R600 Localized edema: Secondary | ICD-10-CM

## 2020-09-02 DIAGNOSIS — Z794 Long term (current) use of insulin: Secondary | ICD-10-CM

## 2020-09-02 DIAGNOSIS — I152 Hypertension secondary to endocrine disorders: Secondary | ICD-10-CM

## 2020-09-02 LAB — POCT GLYCOSYLATED HEMOGLOBIN (HGB A1C): HbA1c, POC (controlled diabetic range): 6.9 % (ref 0.0–7.0)

## 2020-09-02 LAB — GLUCOSE, POCT (MANUAL RESULT ENTRY): POC Glucose: 226 mg/dl — AB (ref 70–99)

## 2020-09-02 MED ORDER — BASAGLAR KWIKPEN 100 UNIT/ML ~~LOC~~ SOPN
55.0000 [IU] | PEN_INJECTOR | Freq: Every day | SUBCUTANEOUS | 6 refills | Status: DC
  Filled 2020-09-02: qty 30, 54d supply, fill #0
  Filled 2020-09-02: qty 15, 27d supply, fill #0
  Filled 2020-12-13 – 2021-02-07 (×2): qty 15, 27d supply, fill #1

## 2020-09-02 MED ORDER — LABETALOL HCL 200 MG PO TABS
200.0000 mg | ORAL_TABLET | Freq: Two times a day (BID) | ORAL | 6 refills | Status: DC
Start: 2020-09-02 — End: 2021-02-25
  Filled 2020-09-02: qty 60, 30d supply, fill #0

## 2020-09-02 MED ORDER — NOVOLOG FLEXPEN 100 UNIT/ML ~~LOC~~ SOPN
0.0000 [IU] | PEN_INJECTOR | Freq: Three times a day (TID) | SUBCUTANEOUS | 6 refills | Status: DC
  Filled 2020-09-02: qty 9, 25d supply, fill #0
  Filled 2020-12-13 – 2021-02-07 (×2): qty 9, 25d supply, fill #1
  Filled 2021-05-27: qty 9, 25d supply, fill #0

## 2020-09-02 NOTE — Progress Notes (Signed)
Subjective:  Patient ID: Andrea [name], female    DOB: 1990/11/10  Age: 30 y.o. MRN: [id number]  CC: Diabetes   HPI Andrea [name]  is a 30 year old female with history of type 2 diabetes mellitus (A1c 6.9), morbid obesity who presents today for follow-up visit. She had a C-section in 06/2020 with delivery of a baby boy.  Interval History: She has not been taking her antihypertensive as she was advised to just monitor her blood pressure during pregnancy but has been consistent with 55 units of Basaglar.  Also prescribed Humalog sliding scale which she has rarely had to use as she stated when she used it she did have hypoglycemia. She is concerned about her weight and would like to be referred for bariatric surgery.  Past Medical History:  Diagnosis Date  . Anemia    as a teenager  . Chicken pox   . Diabetes mellitus    type 2 on insulin  . Impacted teeth with abnormal position    impacted wisdom teeth    Past Surgical History:  Procedure Laterality Date  . CESAREAN SECTION N/A 09/16/2015   Procedure: CESAREAN SECTION;  Surgeon: Guss Bunde, MD;  Location: [place];  Service: Obstetrics;  Laterality: N/A;  . CESAREAN SECTION N/A 02/06/2018   Procedure: CESAREAN SECTION;  Surgeon: Florian Buff, MD;  Location: [place];  Service: Obstetrics;  Laterality: N/A;  . CESAREAN SECTION N/A 06/22/2020   Procedure: CESAREAN SECTION;  Surgeon: Donnamae Jude, MD;  Location: [facility];  Service: Obstetrics;  Laterality: N/A;  . FOREIGN BODY REMOVAL Left 08/28/2015   Procedure: REMOVAL FOREIGN BODY foot;  Surgeon: Vickey Huger, MD;  Location: [place];  Service: Orthopedics;  Laterality: Left;  regional block  . NO PAST SURGERIES    . TOOTH EXTRACTION N/A 07/24/2016   Procedure: EXTRACTION WISDOM TEETH ONE, SIXTEEN, SEVENTEEN AND THIRTY-TWO;  Surgeon: Diona Browner, DDS;  Location: [place];  Service: Oral Surgery;  Laterality: N/A;    Family History  Problem Relation  Age of Onset  . Stroke Father   . Hypertension Father   . Diabetes Mellitus II Father   . Irregular heart beat Father   . Heart disease Father   . Hyperlipidemia Father   . Diabetes Mellitus II Mother   . Hyperlipidemia Mother   . Thyroid disease Mother   . Hypertension Maternal Aunt   . Thyroid disease Maternal Aunt   . Diabetes Mellitus II Maternal Grandmother   . Anesthesia problems Neg Hx     Allergies  Allergen Reactions  . Metformin And Related Diarrhea    Outpatient Medications Prior to Visit  Medication Sig Dispense Refill  . amLODipine (NORVASC) 10 MG tablet Take 1 tablet (10 mg total) by mouth daily. 30 tablet 1  . aspirin EC 81 MG tablet Take 1 tablet (81 mg total) by mouth daily. Swallow whole. 30 tablet 11  . Blood Glucose Monitoring Suppl (TRUE METRIX METER) w/Device KIT Use as instructed. Check blood glucose level by fingerstick 2 times  per day. 1 kit 0  . cyclobenzaprine (FLEXERIL) 10 MG tablet Take 1 tablet (10 mg total) by mouth 3 (three) times daily as needed for muscle spasms. 30 tablet 0  . furosemide (LASIX) 40 MG tablet Take 1 tablet (40 mg total) by mouth daily. 7 tablet 0  . glucose blood (TRUE METRIX BLOOD GLUCOSE TEST) test strip Use as instructed. Check blood glucose level by fingerstick twice per day.  100 each 12  . ibuprofen (ADVIL) 800 MG tablet Take 1 tablet (800 mg total) by mouth every 6 (six) hours. 30 tablet 0  . insulin aspart (NOVOLOG) 100 UNIT/ML injection Inject 0-5 Units into the skin at bedtime. 10 mL 11  . Insulin Glargine (BASAGLAR KWIKPEN) 100 UNIT/ML Inject 58 Units into the skin daily for 26 days. 15 mL 0  . Insulin Pen Needle 31G X 5 MM MISC 1 each by Does not apply route in the morning and at bedtime. For Victoza and Lantus 180 each 1  . Prenatal Vit-Fe Fumarate-FA (PRENATAL VITAMIN PO) Take 1 tablet by mouth daily.    . TRUEplus Lancets 28G MISC Use as instructed. Check blood glucose level by fingerstick twice per day. 100 each 3   . TRUEPLUS PEN NEEDLES 31G X 8 MM MISC 1 EACH BY DOES NOT APPLY ROUTE IN THE MORNING AND AT BEDTIME. FOR VICTOZA AND LANTUS    . oxyCODONE (ROXICODONE) 5 MG immediate release tablet Take 1 tablet (5 mg total) by mouth every 4 (four) hours as needed for severe pain. (Patient not taking: Reported on 09/02/2020) 15 tablet 0   No facility-administered medications prior to visit.     ROS Review of Systems  Constitutional: Negative for activity change, appetite change and fatigue.  HENT: Negative for congestion, sinus pressure and sore throat.   Eyes: Negative for visual disturbance.  Respiratory: Negative for cough, chest tightness, shortness of breath and wheezing.   Cardiovascular: Negative for chest pain and palpitations.  Gastrointestinal: Negative for abdominal distention, abdominal pain and constipation.  Endocrine: Negative for polydipsia.  Genitourinary: Negative for dysuria and frequency.  Musculoskeletal: Negative for arthralgias and back pain.  Skin: Negative for rash.  Neurological: Negative for tremors, light-headedness and numbness.  Hematological: Does not bruise/bleed easily.  Psychiatric/Behavioral: Negative for agitation and behavioral problems.    Objective:  BP (!) 161/94   Pulse 75   Ht _0  (1.575 m)   Wt 273 lb 9.6 oz (124.1 kg)   LMP 09/30/2019   SpO2 100%   BMI 50.04 kg/m   BP/Weight 09/02/2020 06/24/2020 10/26/9676  Systolic BP 938 101 -  Diastolic BP 94 78 -  Wt. (Lbs) 273.6 - 281.9  BMI 50.04 - 51.56      Physical Exam Constitutional:      Appearance: She is well-developed. She is obese.  Neck:     Vascular: No JVD.  Cardiovascular:     Rate and Rhythm: Normal rate.     Heart sounds: Normal heart sounds. No murmur heard.   Pulmonary:     Effort: Pulmonary effort is normal.     Breath sounds: Normal breath sounds. No wheezing or rales.  Chest:     Chest wall: No tenderness.  Abdominal:     General: Bowel sounds are normal. There is no  distension.     Palpations: Abdomen is soft. There is no mass.     Tenderness: There is no abdominal tenderness.  Musculoskeletal:        General: Normal range of motion.     Right lower leg: No edema.     Left lower leg: Edema present.  Neurological:     Mental Status: She is alert and oriented to person, place, and time.  Psychiatric:        Mood and Affect: Mood normal.    Diabetic Foot Exam - Simple   Simple Foot Form Diabetic Foot exam was performed with the following findings: Yes  09/02/2020  3:28 PM  Visual Inspection No deformities, no ulcerations, no other skin breakdown bilaterally: Yes Sensation Testing Intact to touch and monofilament testing bilaterally: Yes Pulse Check Posterior Tibialis and Dorsalis pulse intact bilaterally: Yes Comments      CMP Latest Ref Rng & Units 06/22/2020 06/14/2020 02/23/2020  Glucose 70 - 99 mg/dL 117(H) 174(H) 235(H)  BUN 6 - 20 mg/dL 7 5(L) 3(L)  Creatinine 0.44 - 1.00 mg/dL 0.62 0.64 0.63  Sodium 135 - 145 mmol/L 134(L) 135 136  Potassium 3.5 - 5.1 mmol/L 4.7 3.7 4.0  Chloride 98 - 111 mmol/L 107 105 102  CO2 22 - 32 mmol/L 19(L) 19(L) 18(L)  Calcium 8.9 - 10.3 mg/dL 8.8(L) 8.6(L) 9.0  Total Protein 6.5 - 8.1 g/dL 6.3(L) 6.6 6.7  Total Bilirubin 0.3 - 1.2 mg/dL 0.6 0.7 <0.2  Alkaline Phos 38 - 126 U/L 108 99 64  AST 15 - 41 U/L 24 12(L) 14  ALT 0 - 44 U/L _0 Lipid Panel     Component Value Date/Time   CHOL 139 11/28/2018 1143   TRIG 53 11/28/2018 1143   HDL 38 (L) 11/28/2018 1143   CHOLHDL 3.7 11/28/2018 1143   LDLCALC 90 11/28/2018 1143    CBC    Component Value Date/Time   WBC 11.1 (H) 06/23/2020 0407   RBC 3.58 (L) 06/23/2020 0407   HGB 9.6 (L) 06/23/2020 0407   HGB 11.2 04/24/2020 1012   HCT 28.6 (L) 06/23/2020 0407   HCT 33.8 (L) 04/24/2020 1012   PLT 298 06/23/2020 0407   PLT 354 04/24/2020 1012   MCV 79.9 (L) 06/23/2020 0407   MCV 82 04/24/2020 1012   MCH 26.8 06/23/2020 0407   MCHC 33.6  06/23/2020 0407   RDW 14.6 06/23/2020 0407   RDW 13.7 04/24/2020 1012   LYMPHSABS 2.2 12/19/2019 1335   MONOABS 0.6 09/07/2015 0756   EOSABS 0.1 12/19/2019 1335   BASOSABS 0.0 12/19/2019 1335    Lab Results  Component Value Date   HGBA1C 6.9 09/02/2020    Assessment & Plan:  1. Type 2 diabetes mellitus with other specified complication, with long-term current use of insulin (HCC) Controlled with A1c of 6.9; goal is less than 7.0 Provided her with a copy of NovoLog sliding scale She is currently nursing hence GLP-1 is contraindicated Will reassess at her next visit for nursing status and consider GLP-1 which will be beneficial with regards to weight - POCT glucose (manual entry) - POCT glycosylated hemoglobin (Hb A1C) - Insulin Glargine (BASAGLAR KWIKPEN) 100 UNIT/ML; Inject 55 Units into the skin daily for 26 days.  Dispense: 30 mL; Refill: 6 - insulin aspart (NOVOLOG FLEXPEN) 100 UNIT/ML FlexPen; Inject 0-12 Units into the skin 3 (three) times daily with meals. As per sliding scale  Dispense: 15 mL; Refill: 6  2. Morbid obesity ([place]) She is 2 months postpartum and she does have her postpartum weight and has been advised about this.  It is too soon to refer to bariatric surgeon. Once she is done breast-feeding we can commence a GLP-1 and given a reasonable timeframe and of 1 year postpartum we can then refer to bariatric surgery.  3. Pedal edema Prescribed Lasix by her OB/GYN Elevate feet  4. Hypertension associated with diabetes ([place]) Uncontrolled Currently not on an antihypertensive Placed on labetalol and she will follow-up with the clinical pharmacist regarding this Advised to keep an ambulatory blood pressure log at home Consider low-dose hydralazine (which is  safe for lactation) at next visit if blood pressure is still above goal Counseled on blood pressure goal of less than 130/80, low-sodium, DASH diet, medication compliance, 150 minutes of moderate intensity exercise  per week. Discussed medication compliance, adverse effects. - labetalol (NORMODYNE) 200 MG tablet; Take 1 tablet (200 mg total) by mouth 2 (two) times daily.  Dispense: 60 tablet; Refill: 6    No orders of the defined types were placed in this encounter.   Follow-up: No follow-ups on file.       Charlott Rakes, MD, FAAFP. [facility] [place], [place]   09/02/2020, 2:36 PM

## 2020-09-02 NOTE — Patient Instructions (Signed)
Calorie Counting for Weight Loss Calories are units of energy. Your body needs a certain number of calories from food to keep going throughout the day. When you eat or drink more calories than your body needs, your body stores the extra calories mostly as fat. When you eat or drink fewer calories than your body needs, your body burns fat to get the energy it needs. Calorie counting means keeping track of how many calories you eat and drink each day. Calorie counting can be helpful if you need to lose weight. If you eat fewer calories than your body needs, you should lose weight. Ask your health care provider what a healthy weight is for you. For calorie counting to work, you will need to eat the right number of calories each day to lose a healthy amount of weight per week. A dietitian can help you figure out how many calories you need in a day and will suggest ways to reach your calorie goal.  A healthy amount of weight to lose each week is usually 1-2 lb (0.5-0.9 kg). This usually means that your daily calorie intake should be reduced by 500-750 calories.  Eating 1,200-1,500 calories a day can help most women lose weight.  Eating 1,500-1,800 calories a day can help most men lose weight. What do I need to know about calorie counting? Work with your health care provider or dietitian to determine how many calories you should get each day. To meet your daily calorie goal, you will need to:  Find out how many calories are in each food that you would like to eat. Try to do this before you eat.  Decide how much of the food you plan to eat.  Keep a food log. Do this by writing down what you ate and how many calories it had. To successfully lose weight, it is important to balance calorie counting with a healthy lifestyle that includes regular activity. Where do I find calorie information? The number of calories in a food can be found on a Nutrition Facts label. If a food does not have a Nutrition Facts  label, try to look up the calories online or ask your dietitian for help. Remember that calories are listed per serving. If you choose to have more than one serving of a food, you will have to multiply the calories per serving by the number of servings you plan to eat. For example, the label on a package of bread might say that a serving size is 1 slice and that there are 90 calories in a serving. If you eat 1 slice, you will have eaten 90 calories. If you eat 2 slices, you will have eaten 180 calories.   How do I keep a food log? After each time that you eat, record the following in your food log as soon as possible:  What you ate. Be sure to include toppings, sauces, and other extras on the food.  How much you ate. This can be measured in cups, ounces, or number of items.  How many calories were in each food and drink.  The total number of calories in the food you ate. Keep your food log near you, such as in a pocket-sized notebook or on an app or website on your mobile phone. Some programs will calculate calories for you and show you how many calories you have left to meet your daily goal. What are some portion-control tips?  Know how many calories are in a serving. This will   help you know how many servings you can have of a certain food.  Use a measuring cup to measure serving sizes. You could also try weighing out portions on a kitchen scale. With time, you will be able to estimate serving sizes for some foods.  Take time to put servings of different foods on your favorite plates or in your favorite bowls and cups so you know what a serving looks like.  Try not to eat straight from a food's packaging, such as from a bag or box. Eating straight from the package makes it hard to see how much you are eating and can lead to overeating. Put the amount you would like to eat in a cup or on a plate to make sure you are eating the right portion.  Use smaller plates, glasses, and bowls for smaller  portions and to prevent overeating.  Try not to multitask. For example, avoid watching TV or using your computer while eating. If it is time to eat, sit down at a table and enjoy your food. This will help you recognize when you are full. It will also help you be more mindful of what and how much you are eating. What are tips for following this plan? Reading food labels  Check the calorie count compared with the serving size. The serving size may be smaller than what you are used to eating.  Check the source of the calories. Try to choose foods that are high in protein, fiber, and vitamins, and low in saturated fat, trans fat, and sodium. Shopping  Read nutrition labels while you shop. This will help you make healthy decisions about which foods to buy.  Pay attention to nutrition labels for low-fat or fat-free foods. These foods sometimes have the same number of calories or more calories than the full-fat versions. They also often have added sugar, starch, or salt to make up for flavor that was removed with the fat.  Make a grocery list of lower-calorie foods and stick to it. Cooking  Try to cook your favorite foods in a healthier way. For example, try baking instead of frying.  Use low-fat dairy products. Meal planning  Use more fruits and vegetables. One-half of your plate should be fruits and vegetables.  Include lean proteins, such as chicken, turkey, and fish. Lifestyle Each week, aim to do one of the following:  150 minutes of moderate exercise, such as walking.  75 minutes of vigorous exercise, such as running. General information  Know how many calories are in the foods you eat most often. This will help you calculate calorie counts faster.  Find a way of tracking calories that works for you. Get creative. Try different apps or programs if writing down calories does not work for you. What foods should I eat?  Eat nutritious foods. It is better to have a nutritious,  high-calorie food, such as an avocado, than a food with few nutrients, such as a bag of potato chips.  Use your calories on foods and drinks that will fill you up and will not leave you hungry soon after eating. ? Examples of foods that fill you up are nuts and nut butters, vegetables, lean proteins, and high-fiber foods such as whole grains. High-fiber foods are foods with more than 5 g of fiber per serving.  Pay attention to calories in drinks. Low-calorie drinks include water and unsweetened drinks. The items listed above may not be a complete list of foods and beverages you can eat.   Contact a dietitian for more information.   What foods should I limit? Limit foods or drinks that are not good sources of vitamins, minerals, or protein or that are high in unhealthy fats. These include:  Candy.  Other sweets.  Sodas, specialty coffee drinks, alcohol, and juice. The items listed above may not be a complete list of foods and beverages you should avoid. Contact a dietitian for more information. How do I count calories when eating out?  Pay attention to portions. Often, portions are much larger when eating out. Try these tips to keep portions smaller: ? Consider sharing a meal instead of getting your own. ? If you get your own meal, eat only half of it. Before you start eating, ask for a container and put half of your meal into it. ? When available, consider ordering smaller portions from the menu instead of full portions.  Pay attention to your food and drink choices. Knowing the way food is cooked and what is included with the meal can help you eat fewer calories. ? If calories are listed on the menu, choose the lower-calorie options. ? Choose dishes that include vegetables, fruits, whole grains, low-fat dairy products, and lean proteins. ? Choose items that are boiled, broiled, grilled, or steamed. Avoid items that are buttered, battered, fried, or served with cream sauce. Items labeled as  crispy are usually fried, unless stated otherwise. ? Choose water, low-fat milk, unsweetened iced tea, or other drinks without added sugar. If you want an alcoholic beverage, choose a lower-calorie option, such as a glass of wine or light beer. ? Ask for dressings, sauces, and syrups on the side. These are usually high in calories, so you should limit the amount you eat. ? If you want a salad, choose a garden salad and ask for grilled meats. Avoid extra toppings such as bacon, cheese, or fried items. Ask for the dressing on the side, or ask for olive oil and vinegar or lemon to use as dressing.  Estimate how many servings of a food you are given. Knowing serving sizes will help you be aware of how much food you are eating at restaurants. Where to find more information  Centers for Disease Control and Prevention: www.cdc.gov  [place] Department of Agriculture: myplate.gov Summary  Calorie counting means keeping track of how many calories you eat and drink each day. If you eat fewer calories than your body needs, you should lose weight.  A healthy amount of weight to lose per week is usually 1-2 lb (0.5-0.9 kg). This usually means reducing your daily calorie intake by 500-750 calories.  The number of calories in a food can be found on a Nutrition Facts label. If a food does not have a Nutrition Facts label, try to look up the calories online or ask your dietitian for help.  Use smaller plates, glasses, and bowls for smaller portions and to prevent overeating.  Use your calories on foods and drinks that will fill you up and not leave you hungry shortly after a meal. This information is not intended to replace advice given to you by your health care provider. Make sure you discuss any questions you have with your health care provider. Document Revised: 05/11/2019 Document Reviewed: 05/11/2019 Elsevier Patient Education  2021 Elsevier Inc.  

## 2020-09-03 ENCOUNTER — Other Ambulatory Visit: Payer: Self-pay

## 2020-09-05 ENCOUNTER — Other Ambulatory Visit: Payer: Self-pay

## 2020-09-05 LAB — HM DIABETES EYE EXAM

## 2020-09-19 ENCOUNTER — Ambulatory Visit: Payer: Medicaid Other | Admitting: Pharmacist

## 2020-12-12 ENCOUNTER — Ambulatory Visit: Payer: Medicaid Other | Admitting: Family Medicine

## 2020-12-13 ENCOUNTER — Other Ambulatory Visit: Payer: Self-pay

## 2020-12-17 ENCOUNTER — Other Ambulatory Visit: Payer: Self-pay

## 2021-02-07 ENCOUNTER — Other Ambulatory Visit: Payer: Self-pay

## 2021-02-25 ENCOUNTER — Encounter: Payer: Self-pay | Admitting: Family Medicine

## 2021-02-25 ENCOUNTER — Other Ambulatory Visit: Payer: Self-pay

## 2021-02-25 ENCOUNTER — Ambulatory Visit: Payer: Medicaid Other | Attending: Family Medicine | Admitting: Family Medicine

## 2021-02-25 VITALS — BP 123/83 | HR 87 | Ht 62.0 in | Wt 261.8 lb

## 2021-02-25 DIAGNOSIS — M25561 Pain in right knee: Secondary | ICD-10-CM

## 2021-02-25 DIAGNOSIS — Z6841 Body Mass Index (BMI) 40.0 and over, adult: Secondary | ICD-10-CM | POA: Diagnosis not present

## 2021-02-25 DIAGNOSIS — E1169 Type 2 diabetes mellitus with other specified complication: Secondary | ICD-10-CM

## 2021-02-25 DIAGNOSIS — Z794 Long term (current) use of insulin: Secondary | ICD-10-CM

## 2021-02-25 DIAGNOSIS — Z23 Encounter for immunization: Secondary | ICD-10-CM

## 2021-02-25 LAB — GLUCOSE, POCT (MANUAL RESULT ENTRY): POC Glucose: 195 mg/dl — AB (ref 70–99)

## 2021-02-25 LAB — POCT GLYCOSYLATED HEMOGLOBIN (HGB A1C): HbA1c, POC (controlled diabetic range): 12.9 % — AB (ref 0.0–7.0)

## 2021-02-25 MED ORDER — PREDNISONE 20 MG PO TABS: 20.0000 mg | ORAL_TABLET | Freq: Every day | ORAL | 0 refills | Status: AC

## 2021-02-25 MED ORDER — OZEMPIC (0.25 OR 0.5 MG/DOSE) 2 MG/1.5ML ~~LOC~~ SOPN
0.5000 mg | PEN_INJECTOR | SUBCUTANEOUS | 1 refills | Status: DC
Start: 2021-02-25 — End: 2021-06-05

## 2021-02-25 MED ORDER — BASAGLAR KWIKPEN 100 UNIT/ML ~~LOC~~ SOPN
65.0000 [IU] | PEN_INJECTOR | Freq: Two times a day (BID) | SUBCUTANEOUS | 6 refills | Status: DC
Start: 2021-02-25 — End: 2021-06-05

## 2021-02-25 MED ORDER — OZEMPIC (0.25 OR 0.5 MG/DOSE) 2 MG/1.5ML ~~LOC~~ SOPN: 0.2500 mg | PEN_INJECTOR | SUBCUTANEOUS | 0 refills | Status: DC

## 2021-02-25 NOTE — Progress Notes (Signed)
Subjective:  Patient ID: Andrea [name], female    DOB: [birth year]  Age: 30 y.o. MRN: [id number]  CC: Diabetes   HPI Andrea [name] is a 30 y.o. year old female with a history of type 2 diabetes mellitus (A1c 12.9), morbid obesity who presents today for follow-up visit.  Interval History: For the last 3 weeks she has had R knee swelling and pain and has had  to use a right knee brace brace. Pain is rated as 5/10 and she is unable to bend it.  Denies history of preceding trauma.  A1c is 12.9 up from 6.9 She has been administering 65 units of Lantus as opposed to 55 which appears on her med list. Random sugars are in the 200s. She is not able to get much exercise outside of running around with her 3 kids who are under the age of 99.  Past Medical History:  Diagnosis Date   Anemia    as a teenager   Chicken pox    Diabetes mellitus    type 2 on insulin   Impacted teeth with abnormal position    impacted wisdom teeth    Past Surgical History:  Procedure Laterality Date   CESAREAN SECTION N/A 09/16/2015   Procedure: CESAREAN SECTION;  Surgeon: Guss Bunde, MD;  Location: [place];  Service: Obstetrics;  Laterality: N/A;   CESAREAN SECTION N/A 02/06/2018   Procedure: CESAREAN SECTION;  Surgeon: Florian Buff, MD;  Location: [place];  Service: Obstetrics;  Laterality: N/A;   CESAREAN SECTION N/A 06/22/2020   Procedure: CESAREAN SECTION;  Surgeon: Donnamae Jude, MD;  Location: [facility];  Service: Obstetrics;  Laterality: N/A;   FOREIGN BODY REMOVAL Left 08/28/2015   Procedure: REMOVAL FOREIGN BODY foot;  Surgeon: Vickey Huger, MD;  Location: [place];  Service: Orthopedics;  Laterality: Left;  regional block   NO PAST SURGERIES     TOOTH EXTRACTION N/A 07/24/2016   Procedure: EXTRACTION WISDOM TEETH ONE, SIXTEEN, SEVENTEEN AND THIRTY-TWO;  Surgeon: Diona Browner, DDS;  Location: [place];  Service: Oral Surgery;  Laterality: N/A;    Family History  Problem  Relation Age of Onset   Stroke Father    Hypertension Father    Diabetes Mellitus II Father    Irregular heart beat Father    Heart disease Father    Hyperlipidemia Father    Diabetes Mellitus II Mother    Hyperlipidemia Mother    Thyroid disease Mother    Hypertension Maternal Aunt    Thyroid disease Maternal Aunt    Diabetes Mellitus II Maternal Grandmother    Anesthesia problems Neg Hx     Allergies  Allergen Reactions   Metformin And Related Diarrhea    Outpatient Medications Prior to Visit  Medication Sig Dispense Refill   Blood Glucose Monitoring Suppl (TRUE METRIX METER) w/Device KIT Use as instructed. Check blood glucose level by fingerstick 2 times  per day. 1 kit 0   furosemide (LASIX) 40 MG tablet Take 1 tablet (40 mg total) by mouth daily. 7 tablet 0   glucose blood (TRUE METRIX BLOOD GLUCOSE TEST) test strip Use as instructed. Check blood glucose level by fingerstick twice per day. 100 each 12   insulin aspart (NOVOLOG FLEXPEN) 100 UNIT/ML FlexPen Inject 0-12 Units into the skin 3 (three) times daily with meals. As per sliding scale 15 mL 6   Insulin Glargine (BASAGLAR KWIKPEN) 100 UNIT/ML Inject 55 Units into the skin daily for  26 days. 30 mL 6   Insulin Pen Needle 31G X 5 MM MISC 1 each by Does not apply route in the morning and at bedtime. For Victoza and Lantus 180 each 1   TRUEplus Lancets 28G MISC Use as instructed. Check blood glucose level by fingerstick twice per day. 100 each 3   TRUEPLUS PEN NEEDLES 31G X 8 MM MISC 1 EACH BY DOES NOT APPLY ROUTE IN THE MORNING AND AT BEDTIME. FOR VICTOZA AND LANTUS     aspirin EC 81 MG tablet Take 1 tablet (81 mg total) by mouth daily. Swallow whole. (Patient not taking: Reported on 02/25/2021) 30 tablet 11   Prenatal Vit-Fe Fumarate-FA (PRENATAL VITAMIN PO) Take 1 tablet by mouth daily. (Patient not taking: Reported on 02/25/2021)     cyclobenzaprine (FLEXERIL) 10 MG tablet Take 1 tablet (10 mg total) by mouth 3 (three) times  daily as needed for muscle spasms. (Patient not taking: Reported on 02/25/2021) 30 tablet 0   ibuprofen (ADVIL) 800 MG tablet Take 1 tablet (800 mg total) by mouth every 6 (six) hours. (Patient not taking: Reported on 02/25/2021) 30 tablet 0   labetalol (NORMODYNE) 200 MG tablet Take 1 tablet (200 mg total) by mouth 2 (two) times daily. (Patient not taking: Reported on 02/25/2021) 60 tablet 6   oxyCODONE (ROXICODONE) 5 MG immediate release tablet Take 1 tablet (5 mg total) by mouth every 4 (four) hours as needed for severe pain. (Patient not taking: No sig reported) 15 tablet 0   No facility-administered medications prior to visit.     ROS Review of Systems  Constitutional:  Negative for activity change, appetite change and fatigue.  HENT:  Negative for congestion, sinus pressure and sore throat.   Eyes:  Negative for visual disturbance.  Respiratory:  Negative for cough, chest tightness, shortness of breath and wheezing.   Cardiovascular:  Negative for chest pain and palpitations.  Gastrointestinal:  Negative for abdominal distention, abdominal pain and constipation.  Endocrine: Negative for polydipsia.  Genitourinary:  Negative for dysuria and frequency.  Musculoskeletal:        See HPI  Skin:  Negative for rash.  Neurological:  Negative for tremors, light-headedness and numbness.  Hematological:  Does not bruise/bleed easily.  Psychiatric/Behavioral:  Negative for agitation and behavioral problems.    Objective:  BP 123/83 (BP Location: Left Arm, Patient Position: Sitting, Cuff Size: Large)   Pulse 87   Ht '5\' 2"'  (1.575 m)   Wt 261 lb 12.8 oz (118.8 kg)   SpO2 99%   BMI 47.88 kg/m   BP/Weight 02/25/2021 09/02/2020 2/99/3716  Systolic BP 967 893 810  Diastolic BP 83 94 78  Wt. (Lbs) 261.8 273.6 -  BMI 47.88 50.04 -      Physical Exam Constitutional:      Appearance: She is well-developed.  Cardiovascular:     Rate and Rhythm: Normal rate.     Heart sounds: Normal  heart sounds. No murmur heard. Pulmonary:     Effort: Pulmonary effort is normal.     Breath sounds: Normal breath sounds. No wheezing or rales.  Chest:     Chest wall: No tenderness.  Abdominal:     General: Bowel sounds are normal. There is no distension.     Palpations: Abdomen is soft. There is no mass.     Tenderness: There is no abdominal tenderness.  Musculoskeletal:     Right lower leg: No edema.     Left lower leg: No edema.  Comments: Right knee edema and superior aspect of the knee with reduced flexion and associated tenderness on range of motion  Neurological:     Mental Status: She is alert and oriented to person, place, and time.  Psychiatric:        Mood and Affect: Mood normal.    CMP Latest Ref Rng & Units 06/22/2020 06/14/2020 02/23/2020  Glucose 70 - 99 mg/dL 117(H) 174(H) 235(H)  BUN 6 - 20 mg/dL 7 5(L) 3(L)  Creatinine 0.44 - 1.00 mg/dL 0.62 0.64 0.63  Sodium 135 - 145 mmol/L 134(L) 135 136  Potassium 3.5 - 5.1 mmol/L 4.7 3.7 4.0  Chloride 98 - 111 mmol/L 107 105 102  CO2 22 - 32 mmol/L 19(L) 19(L) 18(L)  Calcium 8.9 - 10.3 mg/dL 8.8(L) 8.6(L) 9.0  Total Protein 6.5 - 8.1 g/dL 6.3(L) 6.6 6.7  Total Bilirubin 0.3 - 1.2 mg/dL 0.6 0.7 <0.2  Alkaline Phos 38 - 126 U/L 108 99 64  AST 15 - 41 U/L 24 12(L) 14  ALT 0 - 44 U/L '9 11 9    ' Lipid Panel     Component Value Date/Time   CHOL 139 11/28/2018 1143   TRIG 53 11/28/2018 1143   HDL 38 (L) 11/28/2018 1143   CHOLHDL 3.7 11/28/2018 1143   LDLCALC 90 11/28/2018 1143    CBC    Component Value Date/Time   WBC 11.1 (H) 06/23/2020 0407   RBC 3.58 (L) 06/23/2020 0407   HGB 9.6 (L) 06/23/2020 0407   HGB 11.2 04/24/2020 1012   HCT 28.6 (L) 06/23/2020 0407   HCT 33.8 (L) 04/24/2020 1012   PLT 298 06/23/2020 0407   PLT 354 04/24/2020 1012   MCV 79.9 (L) 06/23/2020 0407   MCV 82 04/24/2020 1012   MCH 26.8 06/23/2020 0407   MCHC 33.6 06/23/2020 0407   RDW 14.6 06/23/2020 0407   RDW 13.7 04/24/2020 1012    LYMPHSABS 2.2 12/19/2019 1335   MONOABS 0.6 09/07/2015 0756   EOSABS 0.1 12/19/2019 1335   BASOSABS 0.0 12/19/2019 1335    Lab Results  Component Value Date   HGBA1C 12.9 (A) 02/25/2021    Assessment & Plan:  1. Type 2 diabetes mellitus with other specified complication, with long-term current use of insulin (HCC) Uncontrolled with A1c of 12.9 which has trended up from 6.9 previously; goal is less than 7.0 Will place her on Ozempic and see her back in 2 months to uptitrate dose - POCT glucose (manual entry) - POCT glycosylated hemoglobin (Hb A1C) - Semaglutide,0.25 or 0.5MG/DOS, (OZEMPIC, 0.25 OR 0.5 MG/DOSE,) 2 MG/1.5ML SOPN; Inject 0.25 mg into the skin once a week. For 1 month then increase to 0.5 mg.  Dispense: 1.5 mL; Refill: 0 - Microalbumin / creatinine urine ratio; Future - LP+Non-HDL Cholesterol; Future - CMP14+EGFR; Future - Semaglutide,0.25 or 0.5MG/DOS, (OZEMPIC, 0.25 OR 0.5 MG/DOSE,) 2 MG/1.5ML SOPN; Inject 0.5 mg into the skin once a week.  Dispense: 1.5 mL; Refill: 1  2. Acute pain of right knee Will place on short course of prednisone After x-ray report will refer to orthopedic in the event that she needs arthrocentesis. - DG Knee Complete 4 Views Right; Future - predniSONE (DELTASONE) 20 MG tablet; Take 1 tablet (20 mg total) by mouth daily with breakfast.  Dispense: 5 tablet; Refill: 0  3. Morbid obesity ([place]) She has lost 12 pounds in the last 3 months Hopefully initiation of GLP-1 will be beneficial Counseled on exercise, reduction of caloric intake  4. Need  for pneumococcal vaccine - Pneumococcal conjugate vaccine 20-valent    Meds ordered this encounter  Medications   Semaglutide,0.25 or 0.5MG/DOS, (OZEMPIC, 0.25 OR 0.5 MG/DOSE,) 2 MG/1.5ML SOPN    Sig: Inject 0.25 mg into the skin once a week. For 1 month then increase to 0.5 mg.    Dispense:  1.5 mL    Refill:  0   Semaglutide,0.25 or 0.5MG/DOS, (OZEMPIC, 0.25 OR 0.5 MG/DOSE,) 2 MG/1.5ML SOPN     Sig: Inject 0.5 mg into the skin once a week.    Dispense:  1.5 mL    Refill:  1   predniSONE (DELTASONE) 20 MG tablet    Sig: Take 1 tablet (20 mg total) by mouth daily with breakfast.    Dispense:  5 tablet    Refill:  0     Follow-up: Return in about 2 months (around 04/27/2021) for Titration of Ozempic.       Charlott Rakes, MD, FAAFP. [facility] [address] [place], [place]   02/25/2021, 5:06 PM

## 2021-02-26 ENCOUNTER — Telehealth: Payer: Self-pay

## 2021-02-26 NOTE — Telephone Encounter (Signed)
Ozempic PA approved until 02/26/2022. Pharmacy notified

## 2021-02-27 ENCOUNTER — Telehealth: Payer: Self-pay | Admitting: *Deleted

## 2021-02-27 NOTE — Telephone Encounter (Signed)
Copied from CRM [id number]. Topic: Appointment Scheduling - Scheduling Inquiry for Clinic >> Feb 27, 2021 10:00 AM Wyonia Hough E wrote: Reason for CRM: Pt is able to come for imaging of her right knee next Wednesday/ please advise

## 2021-02-27 NOTE — Telephone Encounter (Signed)
Pt advised.

## 2021-02-27 NOTE — Telephone Encounter (Signed)
Left message on voicemail: Patient can go to hospital to have XR completed. At the visitors entrance Instructed to call office for additional questions.

## 2021-03-10 ENCOUNTER — Telehealth: Payer: Self-pay

## 2021-03-10 NOTE — Telephone Encounter (Signed)
Pt is requesting referral to Bariatric surgery. She would like to go to the same one as her mom.

## 2021-03-11 NOTE — Addendum Note (Signed)
Addended by: Hoy Register on: 03/11/2021 12:37 PM   Modules accepted: Orders

## 2021-03-11 NOTE — Telephone Encounter (Signed)
Done

## 2021-03-11 NOTE — Telephone Encounter (Signed)
Pt has been sent a mychart message informing her of referral being placed.

## 2021-03-24 ENCOUNTER — Ambulatory Visit (INDEPENDENT_AMBULATORY_CARE_PROVIDER_SITE_OTHER): Payer: BC Managed Care – PPO | Admitting: Physician Assistant

## 2021-03-24 ENCOUNTER — Encounter: Payer: Self-pay | Admitting: Physician Assistant

## 2021-03-24 ENCOUNTER — Ambulatory Visit: Payer: Self-pay

## 2021-03-24 ENCOUNTER — Other Ambulatory Visit: Payer: Self-pay

## 2021-03-24 DIAGNOSIS — M25561 Pain in right knee: Secondary | ICD-10-CM

## 2021-03-24 NOTE — Progress Notes (Signed)
Office Visit Note   Patient: Andrea [name]           Date of Birth: [birth year]           MRN: [id number] Visit Date: 03/24/2021              Requested by: Hoy Register, MD [address] [place],  [place] [address] PCP: Hoy Register, MD   Assessment & Plan: Visit Diagnoses:  1. Acute pain of right knee     Plan: Right knee was prepped today with Betadine lidocaine was used to anesthetize knee.  Then a total of 65 cc of synovial fluid was aspirated.  Patient tolerated well.  No cortisone injection due to patient's poorly controlled diabetes.  She will work on quad strengthening exercises as shown.  She will begin taking Aleve 2 tablets twice daily with food.  We will see her back in just 2 weeks to see how she is doing overall.  She continues to have mechanical symptoms or recurrent effusion would recommend MRI to rule out meniscal tear.  Questions were encouraged and answered at length.    Follow-Up Instructions: Return in about 2 weeks (around 04/07/2021).   Orders:  Orders Placed This Encounter  Procedures   XR Knee 1-2 Views Right   No orders of the defined types were placed in this encounter.     Procedures: No procedures performed   Clinical Data: No additional findings.   Subjective: Chief Complaint  Patient presents with   Right Knee - Pain    HPI Andrea [name] comes in today as a new patient.  She is 30 year old female with right knee pain for the past month no known injury.  She states she cannot bend the knee due to the swelling and fluid in the knee.  She describes no giving way catching locking but does have painful popping in the knee.  She has tried Lasix without any real relief.  Otherwise had no treatment.  She is diabetic reports her last hemoglobin A1c was 11.0.  She is wanting her knee drained today.  Review of Systems See HPI otherwise negative  Objective: Vital Signs: There were no vitals taken for this visit.  Physical  Exam General well-developed well-nourished female in no acute distress ambulates with a slight antalgic gait without any assistive device. Psych: Alert and oriented x3 Ortho Exam Bilateral knees she has full extension both knees.  Full flexion left knee flexion to approximately 105 degrees right knee dorsiflexion beyond this point cause pain.  She has tenderness along the lateral joint line of the right knee.  No gross instability valgus varus stressing of either knee.  Right knee with significant effusion.  Left knee no effusion.  Bilateral knees without erythema or abnormal warmth.  Specialty Comments:  No specialty comments available.  Imaging: XR Knee 1-2 Views Right  Result Date: 03/24/2021 Right knee 2 views: No acute fracture.  Mild patellofemoral arthritis and mild spurring off the lateral compartment.  No bony abnormalities otherwise.  Knee is well located.    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 04/24/2020   Non-compliance 04/24/2020   Chronic hypertension during pregnancy, antepartum 03/13/2020   Supervision of high risk pregnancy, antepartum 12/12/2019   Preexisting diabetes complicating pregnancy, antepartum 12/12/2019   GBS (group B Streptococcus carrier), +RV culture, currently pregnant 02/05/2018  Obesity in pregnancy 08/23/2017   History of preterm delivery, currently pregnant 07/26/2017   Healthcare maintenance 06/01/2016   BMI 40.0-44.9, adult (HCC) 02/25/2016   History of cesarean section 09/17/2015   Past Medical History:  Diagnosis Date   Anemia    as a teenager   Chicken pox    Diabetes mellitus    type 2 on insulin   Impacted teeth with abnormal position    impacted wisdom teeth    Family History  Problem Relation Age of Onset   Stroke Father    Hypertension Father    Diabetes Mellitus II Father     Irregular heart beat Father    Heart disease Father    Hyperlipidemia Father    Diabetes Mellitus II Mother    Hyperlipidemia Mother    Thyroid disease Mother    Hypertension Maternal Aunt    Thyroid disease Maternal Aunt    Diabetes Mellitus II Maternal Grandmother    Anesthesia problems Neg Hx     Past Surgical History:  Procedure Laterality Date   CESAREAN SECTION N/A 09/16/2015   Procedure: CESAREAN SECTION;  Surgeon: Lesly Dukes, MD;  Location: [facility] BIRTH[facility] SU[facility];  Service: Obstetrics;  Laterality: N/A;   CESAREAN SECTION N/A 02/06/2018   Procedure: CESAREAN SECTION;  Surgeon: Lazaro Arms, MD;  Location: [facility] BIRTHING SUIT[facility];  Service: Obstetrics;  Laterality: N/A;   CESAREAN SECTION N/A 06/22/2020   Procedure: CESAREAN SECTION;  Surgeon: [name], MD;  [facility] LD ORS;  Service: Obstetrics;  Laterality: N/A;   FOREIGN BODY REMOVAL Left 08/28/2015   Procedure: REMOVAL FOREIGN BODY foot;  Surgeon: Dannielle Huh, MD;  Location: [facility] OR;  Service: Orthopedics;  Laterality: Left;  regional block   NO PAST SURGERIES     TOOTH EXTRACTION N/A 07/24/2016   Procedure: EXTRACTION WISDOM TEETH ONE, SIXTEEN, SEVENTEEN AND THIRTY-TWO;  Surgeon: Ocie Doyne, DDS;  Location: [facility] OR;  Service: Oral Surgery;  Laterality: N/A;   Social History   Occupational History   Not on file  Tobacco Use   Smoking status: Former    Types: Cigars    Quit date: 11/09/2019    Years since quitting: 1.3   Smokeless tobacco: Never  Vaping Use   Vaping Use: Never used  Substance and Sexual Activity   Alcohol use: Not Currently    Comment: rare   Drug use: No   Sexual activity: Yes    Birth control/protection: None

## 2021-04-23 ENCOUNTER — Ambulatory Visit: Payer: BC Managed Care – PPO | Admitting: Physician Assistant

## 2021-04-28 ENCOUNTER — Other Ambulatory Visit: Payer: Self-pay

## 2021-04-28 ENCOUNTER — Other Ambulatory Visit: Payer: Self-pay | Admitting: Family Medicine

## 2021-04-28 DIAGNOSIS — E1169 Type 2 diabetes mellitus with other specified complication: Secondary | ICD-10-CM

## 2021-04-28 DIAGNOSIS — Z794 Long term (current) use of insulin: Secondary | ICD-10-CM

## 2021-04-28 NOTE — Telephone Encounter (Signed)
Dose was increased to 63mL on 02/25/21 # 23mL/6RF

## 2021-04-29 ENCOUNTER — Ambulatory Visit: Payer: Medicaid Other | Attending: Family Medicine | Admitting: Family Medicine

## 2021-04-29 ENCOUNTER — Other Ambulatory Visit: Payer: Self-pay

## 2021-04-30 ENCOUNTER — Other Ambulatory Visit: Payer: Self-pay

## 2021-05-02 ENCOUNTER — Telehealth: Payer: Self-pay | Admitting: Family Medicine

## 2021-05-02 NOTE — Telephone Encounter (Signed)
Pt came by office and all questions were answered.

## 2021-05-02 NOTE — Telephone Encounter (Signed)
Copied from CRM [id number]. Topic: General - Other >> May 02, 2021 10:52 AM Marylen Ponto wrote: Reason for CRM: Pt requests that Leanord Hawking return her call at [phone]

## 2021-05-12 ENCOUNTER — Ambulatory Visit: Payer: BC Managed Care – PPO | Admitting: Physician Assistant

## 2021-05-15 ENCOUNTER — Ambulatory Visit: Payer: Medicaid Other | Admitting: Family Medicine

## 2021-05-27 ENCOUNTER — Other Ambulatory Visit: Payer: Self-pay

## 2021-06-05 ENCOUNTER — Encounter: Payer: Self-pay | Admitting: Physician Assistant

## 2021-06-05 ENCOUNTER — Other Ambulatory Visit: Payer: Self-pay

## 2021-06-05 ENCOUNTER — Ambulatory Visit: Payer: Medicaid Other | Attending: Family Medicine | Admitting: Family Medicine

## 2021-06-05 VITALS — BP 148/96 | HR 81 | Resp 18 | Ht 62.0 in | Wt 239.1 lb

## 2021-06-05 DIAGNOSIS — Z794 Long term (current) use of insulin: Secondary | ICD-10-CM

## 2021-06-05 DIAGNOSIS — E1169 Type 2 diabetes mellitus with other specified complication: Secondary | ICD-10-CM | POA: Diagnosis not present

## 2021-06-05 DIAGNOSIS — R030 Elevated blood-pressure reading, without diagnosis of hypertension: Secondary | ICD-10-CM

## 2021-06-05 LAB — POCT GLYCOSYLATED HEMOGLOBIN (HGB A1C): HbA1c, POC (controlled diabetic range): 7.1 % — AB (ref 0.0–7.0)

## 2021-06-05 LAB — GLUCOSE, POCT (MANUAL RESULT ENTRY): POC Glucose: 71 mg/dl (ref 70–99)

## 2021-06-05 MED ORDER — OZEMPIC (0.25 OR 0.5 MG/DOSE) 2 MG/1.5ML ~~LOC~~ SOPN
0.5000 mg | PEN_INJECTOR | SUBCUTANEOUS | 6 refills | Status: AC
  Filled 2021-06-05: qty 1.5, 28d supply, fill #0
  Filled 2021-07-14: qty 1.5, 28d supply, fill #1
  Filled 2021-09-17 – 2021-12-05 (×3): qty 1.5, 28d supply, fill #2

## 2021-06-05 MED ORDER — BASAGLAR KWIKPEN 100 UNIT/ML ~~LOC~~ SOPN
65.0000 [IU] | PEN_INJECTOR | Freq: Two times a day (BID) | SUBCUTANEOUS | 6 refills | Status: AC
  Filled 2021-06-05: qty 30, 23d supply, fill #0
  Filled 2021-10-06: qty 30, 23d supply, fill #1
  Filled 2021-12-05 – 2022-02-24 (×2): qty 30, 23d supply, fill #2
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
## 2021-06-05 NOTE — Patient Instructions (Signed)

## 2021-06-06 LAB — LP+NON-HDL CHOLESTEROL
Cholesterol, Total: 153 mg/dL (ref 100–199)
HDL: 42 mg/dL (ref >39)
LDL Chol Calc (NIH): 101 mg/dL — ABNORMAL HIGH (ref 0–99)
Total Non-HDL-Chol (LDL+VLDL): 111 mg/dL (ref 0–129)
Triglycerides: 45 mg/dL (ref 0–149)
VLDL Cholesterol Cal: 10 mg/dL (ref 5–40)

## 2021-06-06 LAB — CMP14+EGFR
ALT: 7 IU/L (ref 0–32)
AST: 15 IU/L (ref 0–40)
Albumin/Globulin Ratio: 1.5 (ref 1.2–2.2)
Albumin: 4.6 g/dL (ref 3.8–4.8)
Alkaline Phosphatase: 69 IU/L (ref 44–121)
BUN/Creatinine Ratio: 11 (ref 9–23)
BUN: 8 mg/dL (ref 6–20)
Bilirubin Total: 0.3 mg/dL (ref 0.0–1.2)
CO2: 24 mmol/L (ref 20–29)
Calcium: 9.3 mg/dL (ref 8.7–10.2)
Chloride: 105 mmol/L (ref 96–106)
Creatinine, Ser: 0.7 mg/dL (ref 0.57–1.00)
Globulin, Total: 3 g/dL (ref 1.5–4.5)
Glucose: 67 mg/dL — ABNORMAL LOW (ref 70–99)
Potassium: 4.8 mmol/L (ref 3.5–5.2)
Sodium: 142 mmol/L (ref 134–144)
Total Protein: 7.6 g/dL (ref 6.0–8.5)
eGFR: 119 mL/min/{1.73_m2} (ref >59)

## 2021-06-06 LAB — MICROALBUMIN / CREATININE URINE RATIO
Creatinine, Urine: 148.9 mg/dL
Microalb/Creat Ratio: 42 mg/g creat — ABNORMAL HIGH (ref 0–29)
Microalbumin, Urine: 61.8 ug/mL

## 2021-06-18 ENCOUNTER — Ambulatory Visit: Payer: Medicaid Other | Admitting: Physician Assistant

## 2021-06-26 ENCOUNTER — Ambulatory Visit: Payer: Medicaid Other | Admitting: Physician Assistant

## 2021-07-14 ENCOUNTER — Other Ambulatory Visit: Payer: Self-pay

## 2021-08-28 ENCOUNTER — Ambulatory Visit: Payer: Medicaid Other | Admitting: Family Medicine

## 2021-09-01 ENCOUNTER — Other Ambulatory Visit: Payer: Self-pay

## 2021-09-17 ENCOUNTER — Other Ambulatory Visit: Payer: Self-pay

## 2021-09-17 MED ORDER — SEMAGLUTIDE(0.25 OR 0.5MG/DOS) 2 MG/3ML ~~LOC~~ SOPN
0.5000 mg | PEN_INJECTOR | SUBCUTANEOUS | 5 refills | Status: AC
  Filled 2021-09-17: qty 3, 28d supply, fill #0
  Filled 2021-10-09: qty 3, 28d supply, fill #1
  Filled 2021-12-05 – 2022-02-24 (×2): qty 3, 28d supply, fill #2
  Filled 2022-06-05: qty 3, 28d supply, fill #3

## 2021-09-18 ENCOUNTER — Other Ambulatory Visit: Payer: Self-pay

## 2021-10-06 ENCOUNTER — Other Ambulatory Visit: Payer: Self-pay

## 2021-10-09 ENCOUNTER — Other Ambulatory Visit: Payer: Self-pay | Admitting: Family Medicine

## 2021-10-09 ENCOUNTER — Other Ambulatory Visit: Payer: Self-pay

## 2021-10-09 DIAGNOSIS — Z794 Long term (current) use of insulin: Secondary | ICD-10-CM

## 2021-10-09 MED ORDER — NOVOLOG FLEXPEN 100 UNIT/ML ~~LOC~~ SOPN
0.0000 [IU] | PEN_INJECTOR | Freq: Three times a day (TID) | SUBCUTANEOUS | 1 refills | Status: AC
  Filled 2021-10-09: qty 15, 42d supply, fill #0
  Filled 2021-12-05 – 2022-02-24 (×2): qty 15, 42d supply, fill #1

## 2021-10-09 NOTE — Telephone Encounter (Signed)
Requested Prescriptions  Pending Prescriptions Disp Refills  . insulin aspart (NOVOLOG FLEXPEN) 100 UNIT/ML FlexPen 15 mL 6    Sig: Inject 0-12 Units into the skin 3 (three) times daily with meals. As per sliding scale     Endocrinology:  Diabetes - Insulins Passed - 10/09/2021 11:18 AM      Passed - HBA1C is between 0 and 7.9 and within 180 days    HbA1c, POC (controlled diabetic range)  Date Value Ref Range Status  06/05/2021 7.1 (A) 0.0 - 7.0 % Final         Passed - Valid encounter within last 6 months    Recent Outpatient Visits          4 months ago Type 2 diabetes mellitus with other specified complication, with long-term current use of insulin (HCC)   [facility] [place], [place], MD   7 months ago Type 2 diabetes mellitus with other specified complication, with long-term current use of insulin (HCC)   [facility] [place], [place], MD   1 year ago Type 2 diabetes mellitus with other specified complication, with long-term current use of insulin (HCC)   [facility] [place] [facility] [place], [place], MD   2 years ago Type 2 diabetes mellitus with hyperglycemia, with long-term current use of insulin (HCC)   [facility] [place], Odette Horn[place], MD   2 years ago Plantar fasciitis   [facility] Hoy Register, MD      Future Appointments            In 1 week Hoy Register, MD [facility]

## 2021-10-22 ENCOUNTER — Ambulatory Visit: Payer: Medicaid Other | Admitting: Family Medicine

## 2021-12-05 ENCOUNTER — Other Ambulatory Visit: Payer: Self-pay

## 2021-12-12 ENCOUNTER — Other Ambulatory Visit: Payer: Self-pay

## 2022-02-17 ENCOUNTER — Other Ambulatory Visit: Payer: Self-pay

## 2022-02-24 ENCOUNTER — Other Ambulatory Visit: Payer: Self-pay

## 2022-03-31 ENCOUNTER — Other Ambulatory Visit: Payer: Self-pay

## 2022-05-28 ENCOUNTER — Telehealth: Payer: Self-pay

## 2022-05-28 NOTE — Telephone Encounter (Signed)
Patient attempted to be outreached by Junius Finner, PharmD Candidate on 05/28/2022 to discuss hypertension. Patient phone number was incorrect, unable to leave voicemail. Attempted to reach out to other contacts, no answer. Did not leave VM due to HIPAA. Will follow up.   [place] [facility]  PharmD Candidate 2024   I attest that I have reviewed the pharmacy student note and verified the documentation and medical decision making.  Maryan Puls, PharmD PGY-1 [facility] Pharmacy Resident

## 2022-06-05 ENCOUNTER — Other Ambulatory Visit: Payer: Self-pay

## 2022-06-08 ENCOUNTER — Other Ambulatory Visit: Payer: Self-pay | Admitting: Family Medicine

## 2022-06-08 ENCOUNTER — Other Ambulatory Visit: Payer: Self-pay

## 2022-06-09 NOTE — Telephone Encounter (Signed)
Requested medication (s) are due for refill today: yes  Requested medication (s) are on the active medication list: yes  Last refill:  09/17/21  Future visit scheduled: yes  Notes to clinic:  Unable to refill per protocol, routing for review.     Requested Prescriptions  Pending Prescriptions Disp Refills   Semaglutide,0.25 or 0.'5MG'$ /DOS, (OZEMPIC, 0.25 OR 0.5 MG/DOSE,) 2 MG/3ML SOPN 3 mL 5    Sig: Inject 0.5 mg into the skin once a week.     Endocrinology:  Diabetes - GLP-1 Receptor Agonists - semaglutide Failed - 06/08/2022 10:33 AM      Failed - HBA1C in normal range and within 180 days    HbA1c, POC (controlled diabetic range)  Date Value Ref Range Status  06/05/2021 7.1 (A) 0.0 - 7.0 % Final         Failed - Cr in normal range and within 360 days    Creat  Date Value Ref Range Status  04/17/2015 0.62 0.50 - 1.10 mg/dL Final   Creatinine, Ser  Date Value Ref Range Status  06/05/2021 0.70 0.57 - 1.00 mg/dL Final   Creatinine,U  Date Value Ref Range Status  10/31/2012 102.8 mg/dL Final   Creatinine, Urine  Date Value Ref Range Status  06/14/2020 127.07 mg/dL Final         Failed - Valid encounter within last 6 months    Recent Outpatient Visits           1 year ago Type 2 diabetes mellitus with other specified complication, with long-term current use of insulin ([place])   [place], Charlane Ferretti, MD   1 year ago Type 2 diabetes mellitus with other specified complication, with long-term current use of insulin ([place])   [place], Charlane Ferretti, MD   1 year ago Type 2 diabetes mellitus with other specified complication, with long-term current use of insulin ([place])   [place], Charlane Ferretti, MD   2 years ago Type 2 diabetes mellitus with hyperglycemia, with long-term current use of insulin [facility])   [place] Charlott Rakes,  MD   3 years ago Plantar fasciitis   [place] Charlott Rakes, MD

## 2022-06-15 ENCOUNTER — Other Ambulatory Visit: Payer: Self-pay

## 2022-06-18 ENCOUNTER — Encounter: Payer: Self-pay | Admitting: Radiology

## 2022-06-19 ENCOUNTER — Other Ambulatory Visit: Payer: Self-pay

## 2022-09-08 ENCOUNTER — Ambulatory Visit: Payer: Medicaid Other | Admitting: Family Medicine
